# Patient Record
Sex: Male | Born: 1985 | Race: White | Hispanic: No | Marital: Single | Smoking: Current every day smoker
Health system: Southern US, Community
[De-identification: ages and names within clinical notes are randomized; demographics above are authoritative.]

## PROBLEM LIST (undated history)

## (undated) DIAGNOSIS — K7460 Unspecified cirrhosis of liver: Secondary | ICD-10-CM

## (undated) DIAGNOSIS — F1010 Alcohol abuse, uncomplicated: Secondary | ICD-10-CM

## (undated) DIAGNOSIS — M549 Dorsalgia, unspecified: Secondary | ICD-10-CM

## (undated) DIAGNOSIS — R17 Unspecified jaundice: Secondary | ICD-10-CM

## (undated) DIAGNOSIS — F1910 Other psychoactive substance abuse, uncomplicated: Secondary | ICD-10-CM

## (undated) DIAGNOSIS — B1920 Unspecified viral hepatitis C without hepatic coma: Secondary | ICD-10-CM

---

## 2017-12-23 ENCOUNTER — Other Ambulatory Visit: Payer: Self-pay

## 2017-12-23 ENCOUNTER — Encounter (HOSPITAL_COMMUNITY): Payer: Self-pay

## 2017-12-23 ENCOUNTER — Emergency Department (HOSPITAL_COMMUNITY): Payer: Medicaid - Out of State

## 2017-12-23 ENCOUNTER — Inpatient Hospital Stay (HOSPITAL_COMMUNITY)
Admission: EM | Admit: 2017-12-23 | Discharge: 2018-01-21 | DRG: 871 | Disposition: A | Discharge status: Home / Self Care | Payer: Medicaid - Out of State | Attending: Internal Medicine | Admitting: Internal Medicine

## 2017-12-23 DIAGNOSIS — E860 Dehydration: Secondary | ICD-10-CM | POA: Diagnosis present

## 2017-12-23 DIAGNOSIS — B958 Unspecified staphylococcus as the cause of diseases classified elsewhere: Secondary | ICD-10-CM | POA: Diagnosis present

## 2017-12-23 DIAGNOSIS — Z5329 Procedure and treatment not carried out because of patient's decision for other reasons: Secondary | ICD-10-CM | POA: Diagnosis not present

## 2017-12-23 DIAGNOSIS — D509 Iron deficiency anemia, unspecified: Secondary | ICD-10-CM | POA: Diagnosis present

## 2017-12-23 DIAGNOSIS — M60112 Interstitial myositis, left shoulder: Secondary | ICD-10-CM | POA: Diagnosis not present

## 2017-12-23 DIAGNOSIS — K6812 Psoas muscle abscess: Secondary | ICD-10-CM | POA: Diagnosis present

## 2017-12-23 DIAGNOSIS — M60812 Other myositis, left shoulder: Secondary | ICD-10-CM | POA: Diagnosis present

## 2017-12-23 DIAGNOSIS — K7460 Unspecified cirrhosis of liver: Secondary | ICD-10-CM | POA: Diagnosis present

## 2017-12-23 DIAGNOSIS — B182 Chronic viral hepatitis C: Secondary | ICD-10-CM | POA: Diagnosis present

## 2017-12-23 DIAGNOSIS — I369 Nonrheumatic tricuspid valve disorder, unspecified: Secondary | ICD-10-CM | POA: Diagnosis present

## 2017-12-23 DIAGNOSIS — F411 Generalized anxiety disorder: Secondary | ICD-10-CM | POA: Diagnosis present

## 2017-12-23 DIAGNOSIS — I330 Acute and subacute infective endocarditis: Secondary | ICD-10-CM | POA: Diagnosis present

## 2017-12-23 DIAGNOSIS — E43 Unspecified severe protein-calorie malnutrition: Secondary | ICD-10-CM | POA: Diagnosis present

## 2017-12-23 DIAGNOSIS — A419 Sepsis, unspecified organism: Secondary | ICD-10-CM | POA: Diagnosis present

## 2017-12-23 DIAGNOSIS — M7552 Bursitis of left shoulder: Secondary | ICD-10-CM | POA: Diagnosis present

## 2017-12-23 DIAGNOSIS — M869 Osteomyelitis, unspecified: Secondary | ICD-10-CM | POA: Insufficient documentation

## 2017-12-23 DIAGNOSIS — A4101 Sepsis due to Methicillin susceptible Staphylococcus aureus: Principal | ICD-10-CM | POA: Diagnosis present

## 2017-12-23 DIAGNOSIS — Z79899 Other long term (current) drug therapy: Secondary | ICD-10-CM

## 2017-12-23 DIAGNOSIS — F1990 Other psychoactive substance use, unspecified, uncomplicated: Secondary | ICD-10-CM | POA: Diagnosis not present

## 2017-12-23 DIAGNOSIS — R6520 Severe sepsis without septic shock: Secondary | ICD-10-CM

## 2017-12-23 DIAGNOSIS — M4626 Osteomyelitis of vertebra, lumbar region: Secondary | ICD-10-CM | POA: Diagnosis present

## 2017-12-23 DIAGNOSIS — I2690 Septic pulmonary embolism without acute cor pulmonale: Secondary | ICD-10-CM | POA: Diagnosis present

## 2017-12-23 DIAGNOSIS — M25512 Pain in left shoulder: Secondary | ICD-10-CM | POA: Diagnosis not present

## 2017-12-23 DIAGNOSIS — F17200 Nicotine dependence, unspecified, uncomplicated: Secondary | ICD-10-CM | POA: Diagnosis present

## 2017-12-23 DIAGNOSIS — Z880 Allergy status to penicillin: Secondary | ICD-10-CM

## 2017-12-23 DIAGNOSIS — E876 Hypokalemia: Secondary | ICD-10-CM | POA: Diagnosis present

## 2017-12-23 DIAGNOSIS — E872 Acidosis, unspecified: Secondary | ICD-10-CM | POA: Diagnosis present

## 2017-12-23 DIAGNOSIS — Z87892 Personal history of anaphylaxis: Secondary | ICD-10-CM

## 2017-12-23 DIAGNOSIS — Z720 Tobacco use: Secondary | ICD-10-CM

## 2017-12-23 DIAGNOSIS — D638 Anemia in other chronic diseases classified elsewhere: Secondary | ICD-10-CM | POA: Diagnosis present

## 2017-12-23 DIAGNOSIS — B1920 Unspecified viral hepatitis C without hepatic coma: Secondary | ICD-10-CM | POA: Diagnosis present

## 2017-12-23 DIAGNOSIS — Z6824 Body mass index (BMI) 24.0-24.9, adult: Secondary | ICD-10-CM | POA: Diagnosis not present

## 2017-12-23 DIAGNOSIS — K5903 Drug induced constipation: Secondary | ICD-10-CM | POA: Diagnosis not present

## 2017-12-23 DIAGNOSIS — F1910 Other psychoactive substance abuse, uncomplicated: Secondary | ICD-10-CM | POA: Diagnosis present

## 2017-12-23 DIAGNOSIS — T402X5A Adverse effect of other opioids, initial encounter: Secondary | ICD-10-CM | POA: Diagnosis not present

## 2017-12-23 DIAGNOSIS — M25519 Pain in unspecified shoulder: Secondary | ICD-10-CM

## 2017-12-23 DIAGNOSIS — R197 Diarrhea, unspecified: Secondary | ICD-10-CM | POA: Diagnosis not present

## 2017-12-23 DIAGNOSIS — N179 Acute kidney failure, unspecified: Secondary | ICD-10-CM | POA: Diagnosis present

## 2017-12-23 HISTORY — DX: Unspecified cirrhosis of liver: K74.60

## 2017-12-23 HISTORY — DX: Other psychoactive substance abuse, uncomplicated: F19.10

## 2017-12-23 HISTORY — DX: Unspecified viral hepatitis C without hepatic coma: B19.20

## 2017-12-23 HISTORY — DX: Unspecified jaundice: R17

## 2017-12-23 HISTORY — DX: Dorsalgia, unspecified: M54.9

## 2017-12-23 HISTORY — DX: Alcohol abuse, uncomplicated: F10.10

## 2017-12-23 LAB — COMPREHENSIVE METABOLIC PANEL
ALT: 42 U/L (ref 0–44)
AST: 101 U/L — AB (ref 15–41)
Albumin: 2 g/dL — ABNORMAL LOW (ref 3.5–5.0)
Alkaline Phosphatase: 143 U/L — ABNORMAL HIGH (ref 38–126)
Anion gap: 17 — ABNORMAL HIGH (ref 5–15)
BUN: 37 mg/dL — AB (ref 6–20)
CHLORIDE: 100 mmol/L (ref 98–111)
CO2: 16 mmol/L — AB (ref 22–32)
CREATININE: 2.52 mg/dL — AB (ref 0.61–1.24)
Calcium: 6.8 mg/dL — ABNORMAL LOW (ref 8.9–10.3)
GFR calc Af Amer: 37 mL/min — ABNORMAL LOW (ref >60)
GFR calc non Af Amer: 32 mL/min — ABNORMAL LOW (ref >60)
Glucose, Bld: 126 mg/dL — ABNORMAL HIGH (ref 70–99)
Potassium: 2.9 mmol/L — ABNORMAL LOW (ref 3.5–5.1)
SODIUM: 133 mmol/L — AB (ref 135–145)
Total Bilirubin: 1.5 mg/dL — ABNORMAL HIGH (ref 0.3–1.2)
Total Protein: 6.5 g/dL (ref 6.5–8.1)

## 2017-12-23 LAB — CBC WITH DIFFERENTIAL/PLATELET
Abs Immature Granulocytes: 0.38 10*3/uL — ABNORMAL HIGH (ref 0.00–0.07)
BASOS ABS: 0 10*3/uL (ref 0.0–0.1)
Basophils Relative: 0 %
Eosinophils Absolute: 0 10*3/uL (ref 0.0–0.5)
Eosinophils Relative: 0 %
HCT: 38.8 % — ABNORMAL LOW (ref 39.0–52.0)
Hemoglobin: 12.5 g/dL — ABNORMAL LOW (ref 13.0–17.0)
IMMATURE GRANULOCYTES: 2 %
LYMPHS PCT: 6 %
Lymphs Abs: 0.9 10*3/uL (ref 0.7–4.0)
MCH: 26.9 pg (ref 26.0–34.0)
MCHC: 32.2 g/dL (ref 30.0–36.0)
MCV: 83.4 fL (ref 80.0–100.0)
MONO ABS: 0.7 10*3/uL (ref 0.1–1.0)
Monocytes Relative: 5 %
NEUTROS ABS: 13.5 10*3/uL — AB (ref 1.7–7.7)
Neutrophils Relative %: 87 %
Platelets: 162 10*3/uL (ref 150–400)
RBC: 4.65 MIL/uL (ref 4.22–5.81)
RDW: 13.1 % (ref 11.5–15.5)
WBC: 15.6 10*3/uL — AB (ref 4.0–10.5)
nRBC: 0 % (ref 0.0–0.2)

## 2017-12-23 LAB — URINALYSIS, ROUTINE W REFLEX MICROSCOPIC
Bilirubin Urine: NEGATIVE
Glucose, UA: NEGATIVE mg/dL
Hgb urine dipstick: NEGATIVE
Ketones, ur: NEGATIVE mg/dL
Nitrite: NEGATIVE
Protein, ur: 30 mg/dL — AB
SPECIFIC GRAVITY, URINE: 1.011 (ref 1.005–1.030)
pH: 5 (ref 5.0–8.0)

## 2017-12-23 LAB — I-STAT CG4 LACTIC ACID, ED
Lactic Acid, Venous: 4.09 mmol/L (ref 0.5–1.9)
Lactic Acid, Venous: 1.46 mmol/L (ref 0.5–1.9)

## 2017-12-23 LAB — INFLUENZA PANEL BY PCR (TYPE A & B)
INFLAPCR: NEGATIVE
INFLBPCR: NEGATIVE

## 2017-12-23 LAB — SEDIMENTATION RATE: Sed Rate: 87 mm/hr — ABNORMAL HIGH (ref 0–16)

## 2017-12-23 LAB — C-REACTIVE PROTEIN: CRP: 29.6 mg/dL — ABNORMAL HIGH (ref <1.0)

## 2017-12-23 LAB — MAGNESIUM: Magnesium: 2.1 mg/dL (ref 1.7–2.4)

## 2017-12-23 MED ORDER — SODIUM CHLORIDE 0.9 % IV SOLN
INTRAVENOUS | Status: DC
  Administered 2017-12-23: 19:00:00 via INTRAVENOUS

## 2017-12-23 MED ORDER — LEVOFLOXACIN IN D5W 750 MG/150ML IV SOLN
750.0000 mg | Freq: Once | INTRAVENOUS | Status: AC
  Administered 2017-12-23: 750 mg via INTRAVENOUS
  Filled 2017-12-23: qty 150

## 2017-12-23 MED ORDER — OXYCODONE HCL 5 MG PO TABS
5.0000 mg | ORAL_TABLET | ORAL | Status: DC | PRN
  Administered 2017-12-23 – 2017-12-28 (×17): 5 mg via ORAL
  Filled 2017-12-23 (×16): qty 1

## 2017-12-23 MED ORDER — FENTANYL CITRATE (PF) 100 MCG/2ML IJ SOLN
50.0000 ug | INTRAMUSCULAR | Status: DC | PRN
  Administered 2017-12-23 (×2): 50 ug via INTRAVENOUS
  Filled 2017-12-23 (×2): qty 2

## 2017-12-23 MED ORDER — SODIUM CHLORIDE 0.9 % IV BOLUS
1000.0000 mL | Freq: Once | INTRAVENOUS | Status: AC
  Administered 2017-12-23: 1000 mL via INTRAVENOUS

## 2017-12-23 MED ORDER — ACETAMINOPHEN 325 MG PO TABS
650.0000 mg | ORAL_TABLET | Freq: Three times a day (TID) | ORAL | Status: DC | PRN
  Administered 2017-12-23 – 2017-12-29 (×7): 650 mg via ORAL
  Filled 2017-12-23 (×7): qty 2

## 2017-12-23 MED ORDER — MORPHINE SULFATE (PF) 2 MG/ML IV SOLN
2.0000 mg | INTRAVENOUS | Status: DC | PRN
  Administered 2017-12-24 – 2017-12-28 (×10): 2 mg via INTRAVENOUS
  Filled 2017-12-23 (×10): qty 1

## 2017-12-23 MED ORDER — GADOBUTROL 1 MMOL/ML IV SOLN
7.0000 mL | Freq: Once | INTRAVENOUS | Status: AC | PRN
  Administered 2017-12-23: 7 mL via INTRAVENOUS

## 2017-12-23 MED ORDER — POTASSIUM CHLORIDE CRYS ER 20 MEQ PO TBCR
40.0000 meq | EXTENDED_RELEASE_TABLET | ORAL | Status: AC
  Administered 2017-12-23 (×2): 40 meq via ORAL
  Filled 2017-12-23 (×2): qty 2

## 2017-12-23 MED ORDER — SODIUM CHLORIDE 0.9 % IV SOLN
1.0000 g | Freq: Three times a day (TID) | INTRAVENOUS | Status: DC
  Administered 2017-12-23 – 2017-12-24 (×2): 1 g via INTRAVENOUS
  Filled 2017-12-23 (×8): qty 1

## 2017-12-23 MED ORDER — ACETAMINOPHEN 500 MG PO TABS
1000.0000 mg | ORAL_TABLET | Freq: Once | ORAL | Status: AC
  Administered 2017-12-23: 1000 mg via ORAL
  Filled 2017-12-23: qty 2

## 2017-12-23 MED ORDER — VANCOMYCIN HCL IN DEXTROSE 750-5 MG/150ML-% IV SOLN
750.0000 mg | Freq: Two times a day (BID) | INTRAVENOUS | Status: DC
  Administered 2017-12-24: 750 mg via INTRAVENOUS
  Filled 2017-12-23 (×6): qty 150

## 2017-12-23 MED ORDER — SODIUM CHLORIDE 0.9 % IV BOLUS
500.0000 mL | Freq: Once | INTRAVENOUS | Status: AC
  Administered 2017-12-23: 500 mL via INTRAVENOUS

## 2017-12-23 MED ORDER — NICOTINE 21 MG/24HR TD PT24
21.0000 mg | MEDICATED_PATCH | Freq: Every day | TRANSDERMAL | Status: DC
  Filled 2017-12-23 (×10): qty 1

## 2017-12-23 MED ORDER — LEVOFLOXACIN IN D5W 750 MG/150ML IV SOLN: 750.0000 mg | INTRAVENOUS | Status: DC

## 2017-12-23 MED ORDER — ONDANSETRON HCL 4 MG/2ML IJ SOLN: 4.0000 mg | Freq: Four times a day (QID) | INTRAMUSCULAR | Status: DC | PRN

## 2017-12-23 MED ORDER — SODIUM CHLORIDE 0.9 % IV SOLN
2.0000 g | Freq: Once | INTRAVENOUS | Status: AC
  Administered 2017-12-23: 2 g via INTRAVENOUS
  Filled 2017-12-23: qty 2

## 2017-12-23 MED ORDER — VANCOMYCIN HCL 10 G IV SOLR
2000.0000 mg | Freq: Once | INTRAVENOUS | Status: AC
  Administered 2017-12-23: 2000 mg via INTRAVENOUS
  Filled 2017-12-23: qty 2000

## 2017-12-23 MED ORDER — SODIUM CHLORIDE 0.9% FLUSH
3.0000 mL | Freq: Two times a day (BID) | INTRAVENOUS | Status: DC
  Administered 2017-12-24 – 2018-01-21 (×24): 3 mL via INTRAVENOUS

## 2017-12-23 MED ORDER — SACCHAROMYCES BOULARDII 250 MG PO CAPS
250.0000 mg | ORAL_CAPSULE | Freq: Two times a day (BID) | ORAL | Status: DC
  Administered 2017-12-23 – 2018-01-20 (×57): 250 mg via ORAL
  Filled 2017-12-23 (×60): qty 1

## 2017-12-23 MED ORDER — ONDANSETRON HCL 4 MG PO TABS: 4.0000 mg | ORAL_TABLET | Freq: Four times a day (QID) | ORAL | Status: DC | PRN

## 2017-12-23 MED ORDER — HEPARIN SODIUM (PORCINE) 5000 UNIT/ML IJ SOLN
5000.0000 [IU] | Freq: Three times a day (TID) | INTRAMUSCULAR | Status: DC
  Administered 2017-12-23 – 2018-01-10 (×52): 5000 [IU] via SUBCUTANEOUS
  Filled 2017-12-23 (×52): qty 1

## 2017-12-23 MED ORDER — ACETAMINOPHEN 650 MG RE SUPP: 650.0000 mg | Freq: Three times a day (TID) | RECTAL | Status: DC | PRN

## 2017-12-23 MED ORDER — VANCOMYCIN HCL IN DEXTROSE 1-5 GM/200ML-% IV SOLN: 1000.0000 mg | Freq: Once | INTRAVENOUS | Status: DC

## 2017-12-23 NOTE — ED Triage Notes (Signed)
Pt reports he stood up from the couch a few days ago and had pain in lower back.  Pt went to [facility] and was told he has a hairline fracture in lower back.  Since then pt reports fever and bodyaches.

## 2017-12-23 NOTE — ED Notes (Signed)
Lab drawing second blood culture at this time.

## 2017-12-23 NOTE — ED Notes (Signed)
PT reminded the need for a urine sample from him. Urinal at bedside.

## 2017-12-23 NOTE — ED Notes (Signed)
CRITICAL VALUE ALERT  Critical Value:  I-stat lactic 4.09  Date & Time Notied: 12/23/17 0956  Provider Notified: Dr. Jodi Mourning

## 2017-12-23 NOTE — Progress Notes (Signed)
Pharmacy Antibiotic Note  Howard Diaz is a 32 y.o. male admitted on 12/23/2017 with sepsis and osteomyelitis of lumbar spine.  Pharmacy has been consulted for Vancomycin, Levaquin, and Aztreonam dosing.  Plan: Vancomycin 2000 mg IV x 1 dose. Vancomycin 750 mg IV every 12 hours.  Goal trough 15-20 mcg/mL.  Aztreonam 1000 mg IV every 8 hours. Levaquin 750 mg IV every 48 hours. Monitor labs, c/s, and vanco trough as indicated.  Height: 6' (182.9 cm) Weight: 180 lb (81.6 kg) IBW/kg (Calculated) : 77.6  Temp (24hrs), Avg:99.4 F (37.4 C), Min:98.5 F (36.9 C), Max:100.2 F (37.9 C)  Recent Labs  Lab 12/23/17 0950 12/23/17 0952 12/23/17 1200 12/23/17 1431  WBC 15.6*  --   --   --   CREATININE  --   --  2.52*  --   LATICACIDVEN  --  4.09*  --  1.46    Estimated Creatinine Clearance: 45.8 mL/min (A) (by C-G formula based on SCr of 2.52 mg/dL (H)).    Allergies  Allergen Reactions  . Penicillins Anaphylaxis    Has patient had a PCN reaction causing immediate rash, facial/tongue/throat swelling, SOB or lightheadedness with hypotension: Yes Has patient had a PCN reaction causing severe rash involving mucus membranes or skin necrosis: Yes Has patient had a PCN reaction that required hospitalization:Yes Has patient had a PCN reaction occurring within the last 10 years: No If all of the above answers are "NO", then may proceed with Cephalosporin use.     Antimicrobials this admission: Vanco 11/5 >>  Aztreonam 11/5 >>  Levaquin 11/5 >>  Microbiology results: 11/5 BCx: pending 11/5 UCx: pending   Thank you for allowing pharmacy to be a part of this patient's care.  Tad Moore 12/23/2017 3:21 PM

## 2017-12-23 NOTE — H&P (Signed)
History and Physical    Howard Diaz WYO:[id number] DOB: 1985/04/08 DOA: 12/23/2017  Referring MD/NP/PA: Dr. Reather Converse PCP: Patient, No Pcp Per  Patient coming from: Home  Chief Complaint: Fever, lower back pain, general malaise  HPI: Howard Diaz is a 32 y.o. male with a past medical history significant for alcohol use, history of hepatitis C, acute hepatic toxicity felt to be a combination of hepatitis C and alcohol abuse (he required hospitalization at that time and experienced jaundice), also with History of tobacco abuse and IV drug use; who presented to the emergency department secondary to lower back pain, weakness, fever and general malaise.  Patient reports back pain has been present for the last week or so and worsening.  Pain is localized in the lower aspect of his back exacerbated by any kind of movement, reports sudden intermittent radiation across his back and reported some numbness feeling. Patient has had anorexia and reported some loose stools.  Patient denies any chest pain, cough, dysuria, hematuria, melena, hematochezia, abdominal pain, nausea, vomiting or any other complaints.  He was seen in the emergency department (outside facility), where he was diagnosed with fine hair fracture after having a CT scan of his lower protuberance.  Patient was discharged home on Flexeril and pain medication.  He reported no improvement in symptoms and given the initiation of fever decide to seek further medical care.  In the ED work-up demonstrated patient meeting sepsis criteria with elevated temperature, tachycardia, elevated WBCs and images studies suggesting acute lumbar osteomyelitis.  Patient received fluid resuscitation as per sepsis protocol, cultures taken and patient started on IV antibiotics.  [facility] has been contacted to new patient for further management.  Past Medical/Surgical History: Past Medical History:  Diagnosis Date  . Alcohol abuse   . Back pain   . Cirrhosis of  liver ([place])   . Hepatitis C   . IV drug abuse ([place])   . Jaundice     History reviewed. No pertinent surgical history.  Social History:  reports that he has been smoking. He has been smoking about 1.00 pack per day. He has never used smokeless tobacco. He reports that he drank alcohol. He reported having hx of IV drug usage, but has been clean for couple weeks now.  Allergies: Allergies  Allergen Reactions  . Penicillins Anaphylaxis    Has patient had a PCN reaction causing immediate rash, facial/tongue/throat swelling, SOB or lightheadedness with hypotension: Yes Has patient had a PCN reaction causing severe rash involving mucus membranes or skin necrosis: Yes Has patient had a PCN reaction that required hospitalization:Yes Has patient had a PCN reaction occurring within the last 10 years: No If all of the above answers are "NO", then may proceed with Cephalosporin use.     Family History:  History reviewed. No pertinent family history.  Prior to Admission medications   Medication Sig Start Date End Date Taking? Authorizing Provider  buprenorphine-naloxone (SUBOXONE) 8-2 mg SUBL SL tablet Place 1 tablet under the tongue 3 (three) times daily.   Yes [provider]  lidocaine (LIDODERM) 5 % Place 1 patch onto the skin daily. Remove & Discard patch within 12 hours or as directed by MD   Yes [provider]  naproxen (NAPROSYN) 500 MG tablet Take 500 mg by mouth 2 (two) times daily. 12/11/17  Yes [provider]  cyclobenzaprine (FLEXERIL) 10 MG tablet Take 10 mg by mouth every 8 (eight) hours. 12/11/17   [provider]    Review of  Systems:  Negative except as otherwise mentioned in HPI.   Physical Exam: Vitals:   12/23/17 1500 12/23/17 1520 12/23/17 1530 12/23/17 1627  BP:  117/72 118/73 119/68  Pulse: (!) 106 (!) 107 (!) 109 (!) 124  Resp: (!) 22 (!) 22 (!) 26 20  Temp:    98.3 F (36.8 C)  TempSrc:    Oral  SpO2: 93% 93% 93% 99%    Weight:    77.4 kg  Height:    5' 11" (1.803 m)   Constitutional: Warm to touch, denying chest pain no having shortness of breath.  Report experiencing dry mouth and just complaining of lower back pain with any movement.  No jaundiced on exam. Eyes: PERRL, lids and conjunctivae normal, no icterus, no nystagmus. ENMT: Mucous membranes are moist. Posterior pharynx clear of any exudate or lesions. fair dentition. Neck: normal, supple, no masses, no thyromegaly, no JVD Respiratory: clear to auscultation bilaterally, no wheezing, no crackles. Normal respiratory effort. No accessory muscle use.  Cardiovascular: Tachycardic, sinus rhythm appreciated on ED telemetry monitoring.  No extremity edema. 2+ pedal pulses. No carotid bruits.  Abdomen: no tenderness, no masses palpated. No hepatosplenomegaly. Bowel sounds positive.  Musculoskeletal: no clubbing / cyanosis. No joint deformity upper and lower extremities. Good ROM, no contractures. Normal muscle tone.  Patient complaining of lower back pain with movement and deep palpation. Skin: no rashes, lesions, ulcers. No induration Neurologic: CN 2-12 grossly intact. DTR normal. Strength 5/5 in all 4.  Psychiatric: Normal judgment and insight. Alert and oriented x 3. Normal mood.    Labs on Admission: I have personally reviewed the following labs and imaging studies  CBC: Recent Labs  Lab 12/23/17 0950  WBC 15.6*  NEUTROABS 13.5*  HGB 12.5*  HCT 38.8*  MCV 83.4  PLT 062   Basic Metabolic Panel: Recent Labs  Lab 12/23/17 1200 12/23/17 1523  NA 133*  --   K 2.9*  --   CL 100  --   CO2 16*  --   GLUCOSE 126*  --   BUN 37*  --   CREATININE 2.52*  --   CALCIUM 6.8*  --   MG  --  2.1   GFR: Estimated Creatinine Clearance: 44.4 mL/min (A) (by C-G formula based on SCr of 2.52 mg/dL (H)).   Liver Function Tests: Recent Labs  Lab 12/23/17 1200  AST 101*  ALT 42  ALKPHOS 143*  BILITOT 1.5*  PROT 6.5  ALBUMIN 2.0*   Urine  analysis:    Component Value Date/Time   COLORURINE AMBER (A) 12/23/2017 1300   APPEARANCEUR CLOUDY (A) 12/23/2017 1300   LABSPEC 1.011 12/23/2017 1300   PHURINE 5.0 12/23/2017 1300   GLUCOSEU NEGATIVE 12/23/2017 1300   HGBUR NEGATIVE 12/23/2017 1300   BILIRUBINUR NEGATIVE 12/23/2017 1300   KETONESUR NEGATIVE 12/23/2017 1300   PROTEINUR 30 (A) 12/23/2017 1300   NITRITE NEGATIVE 12/23/2017 1300   LEUKOCYTESUR TRACE (A) 12/23/2017 1300    Recent Results (from the past 240 hour(s))  Blood Culture (routine x 2)     Status: None (Preliminary result)   Collection Time: 12/23/17  9:50 AM  Result Value Ref Range Status   Specimen Description BLOOD LEFT HAND  Final   Special Requests   Final    BOTTLES DRAWN AEROBIC AND ANAEROBIC Blood Culture adequate volume Performed at [facility], [address] [place], [place] [address]    Culture PENDING  Incomplete   Report Status PENDING  Incomplete  Radiological Exams on Admission: Dg Chest 2 View  Result Date: 12/23/2017 CLINICAL DATA:  Fever and body aches. EXAM: CHEST - 2 VIEW COMPARISON:  None. FINDINGS: The heart is normal in size given the AP projection. The mediastinal and hilar contours appear normal. Streaky areas of subsegmental atelectasis are noted. No definite infiltrates or effusions. The bony thorax is intact. IMPRESSION: Low lung volumes with streaky areas of subsegmental atelectasis but no definite infiltrates or effusions. Electronically Signed   By: P.  Gallerani M.D.   On: 12/23/2017 10:35   Mr Thoracic Spine W Wo Contrast  Result Date: 12/23/2017 CLINICAL DATA:  Initial evaluation for progressive back pain, fever, history of IV drug abuse. EXAM: MRI THORACIC AND LUMBAR SPINE WITHOUT AND WITH CONTRAST TECHNIQUE: Multiplanar and multiecho pulse sequences of the thoracic and lumbar spine were obtained without and with intravenous contrast. CONTRAST:  7 cc Gadavist. COMPARISON:  None available. FINDINGS: MRI THORACIC SPINE  FINDINGS Alignment: Examination technically limited by you technique in large field-of-view on sagittal sequences. Mild scoliosis. Alignment otherwise normal with preservation of the normal thoracic kyphosis. No listhesis or malalignment. Vertebrae: Vertebral body heights maintained without evidence for acute or chronic fracture. Bone marrow signal intensity diffusely decreased on T1 weighted imaging, most commonly related to anemia, smoking, or obesity. Few small benign hemangiomas noted within the lower thoracic spine. No worrisome osseous lesions. No evidence for osteomyelitis discitis or septic arthritis within the thoracic spine. Cord: Signal intensity within the thoracic spinal cord is normal. No epidural collections. Paraspinal and other soft tissues: Paraspinous soft tissues demonstrate no acute finding. Atelectasis versus consolidation noted within the posterior left lung base. Visualized lungs are otherwise grossly clear. Visualized visceral structures grossly unremarkable. Disc levels: T1-2:  Unremarkable. T2-3: Unremarkable. T3-4:  Unremarkable. T4-5: Shallow left paracentral disc protrusion mildly indents the left ventral thecal sac. Minimal flattening of the left ventral cord without significant stenosis. T5-6:  Unremarkable. T6-7: Right paracentral disc protrusion with slight superior migration. Protruding disc contacts the right ventral cord with secondary mild cord flattening. No significant stenosis. T7-8: Shallow right paracentral disc protrusion abuts the right ventral cord. No significant stenosis or cord deformity. T8-9: Shallow left paracentral disc protrusion mildly indents the left ventral thecal sac. Minimal flattening of the left ventral cord without significant stenosis. T9-10: Unremarkable. T10-11: Minimal disc bulge. Mild posterior element hypertrophy. No significant stenosis. T11-12: Mild posterior element hypertrophy. No significant stenosis. T12-L1:  Unremarkable. MRI LUMBAR SPINE  FINDINGS Segmentation: Normal segmentation. Lowest well-formed disc labeled the L5-S1 level. Alignment: Straightening of the normal lumbar lordosis with trace dextroscoliosis. No listhesis or malalignment. Vertebrae: Abnormal edema and enhancement seen within the L3 and L4 vertebral bodies centered about the L3-4 disc space. Abnormal edema with loss of normal disc height seen within the L3-4 disc itself. Findings consistent with acute osteomyelitis discitis. Mild diffuse enhancement seen within the ventral epidural space, extending into the bilateral L3-4 neural foramina without frank epidural abscess or collection. Associated paraspinous edema and enhancement within the adjacent psoas musculature bilaterally. Superimposed paraspinous abscess within the right psoas muscle measures 1.3 x 1.0 x 2.7 cm (AP by transverse by craniocaudad, series 7, image 25). No other discrete soft tissue collections. Mild edema and enhancement noted about the bilateral L3-4 facets as well. No other evidence for acute infection within the lumbar spine. Vertebral body height maintained at this time without evidence for acute or chronic fracture. Underlying bone marrow signal intensity diffusely decreased on T1 weighted imaging. Small benign hemangioma noted   within the T12 vertebral body. No worrisome osseous lesions. Conus medullaris: Extends to the L1 level and appears normal. Paraspinal and other soft tissues: Paraspinous edema and enhancement adjacent to the L3-4 interspace with superimposed right psoas muscle abscess as above. Soft tissue edema within the posterior paraspinous musculature extending from L3-4 inferiorly could be reactive in nature or reflect associated myositis (series 2, images 6, 12). No discrete collections within this region. Retroperitoneal lymph nodes measure up to 16 mm in short axis (series 4, image 15), indeterminate, but could be reactive. Visualized visceral structures grossly unremarkable. Disc levels:  L1-2:  Unremarkable. L2-3:  Unremarkable. L3-4: Findings consistent with acute osteomyelitis discitis. Associated loss of L3-4 disc height with diffuse disc bulging. Enhancement within the ventral epidural space extending into the bilateral L3-4 neural foramina without frank epidural abscess. Resultant mild spinal stenosis. Mild to moderate bilateral L3 neural foraminal narrowing. L4-5:  Unremarkable. L5-S1:  Unremarkable. IMPRESSION: 1. Findings consistent with acute osteomyelitis discitis involving the L3-4 level. Associated enhancement involving the adjacent ventral epidural space without discrete epidural abscess or collection. Mild spinal stenosis at this level. 2. Paraspinous phlegmon and edema adjacent to the L3-4 level with superimposed 1.3 x 1.0 x 2.7 cm right psoas muscle abscess as above. 3. No other evidence for acute infection within the thoracolumbar spine. 4. Multifocal small disc protrusions involving the mid-thoracic spine as above without significant stenosis. Electronically Signed   By: Benjamin  McClintock M.D.   On: 12/23/2017 13:47   Mr Lumbar Spine W Wo Contrast  Result Date: 12/23/2017 CLINICAL DATA:  Initial evaluation for progressive back pain, fever, history of IV drug abuse. EXAM: MRI THORACIC AND LUMBAR SPINE WITHOUT AND WITH CONTRAST TECHNIQUE: Multiplanar and multiecho pulse sequences of the thoracic and lumbar spine were obtained without and with intravenous contrast. CONTRAST:  7 cc Gadavist. COMPARISON:  None available. FINDINGS: MRI THORACIC SPINE FINDINGS Alignment: Examination technically limited by you technique in large field-of-view on sagittal sequences. Mild scoliosis. Alignment otherwise normal with preservation of the normal thoracic kyphosis. No listhesis or malalignment. Vertebrae: Vertebral body heights maintained without evidence for acute or chronic fracture. Bone marrow signal intensity diffusely decreased on T1 weighted imaging, most commonly related to anemia,  smoking, or obesity. Few small benign hemangiomas noted within the lower thoracic spine. No worrisome osseous lesions. No evidence for osteomyelitis discitis or septic arthritis within the thoracic spine. Cord: Signal intensity within the thoracic spinal cord is normal. No epidural collections. Paraspinal and other soft tissues: Paraspinous soft tissues demonstrate no acute finding. Atelectasis versus consolidation noted within the posterior left lung base. Visualized lungs are otherwise grossly clear. Visualized visceral structures grossly unremarkable. Disc levels: T1-2:  Unremarkable. T2-3: Unremarkable. T3-4:  Unremarkable. T4-5: Shallow left paracentral disc protrusion mildly indents the left ventral thecal sac. Minimal flattening of the left ventral cord without significant stenosis. T5-6:  Unremarkable. T6-7: Right paracentral disc protrusion with slight superior migration. Protruding disc contacts the right ventral cord with secondary mild cord flattening. No significant stenosis. T7-8: Shallow right paracentral disc protrusion abuts the right ventral cord. No significant stenosis or cord deformity. T8-9: Shallow left paracentral disc protrusion mildly indents the left ventral thecal sac. Minimal flattening of the left ventral cord without significant stenosis. T9-10: Unremarkable. T10-11: Minimal disc bulge. Mild posterior element hypertrophy. No significant stenosis. T11-12: Mild posterior element hypertrophy. No significant stenosis. T12-L1:  Unremarkable. MRI LUMBAR SPINE FINDINGS Segmentation: Normal segmentation. Lowest well-formed disc labeled the L5-S1 level. Alignment: Straightening of the normal lumbar   lordosis with trace dextroscoliosis. No listhesis or malalignment. Vertebrae: Abnormal edema and enhancement seen within the L3 and L4 vertebral bodies centered about the L3-4 disc space. Abnormal edema with loss of normal disc height seen within the L3-4 disc itself. Findings consistent with acute  osteomyelitis discitis. Mild diffuse enhancement seen within the ventral epidural space, extending into the bilateral L3-4 neural foramina without frank epidural abscess or collection. Associated paraspinous edema and enhancement within the adjacent psoas musculature bilaterally. Superimposed paraspinous abscess within the right psoas muscle measures 1.3 x 1.0 x 2.7 cm (AP by transverse by craniocaudad, series 7, image 25). No other discrete soft tissue collections. Mild edema and enhancement noted about the bilateral L3-4 facets as well. No other evidence for acute infection within the lumbar spine. Vertebral body height maintained at this time without evidence for acute or chronic fracture. Underlying bone marrow signal intensity diffusely decreased on T1 weighted imaging. Small benign hemangioma noted within the T12 vertebral body. No worrisome osseous lesions. Conus medullaris: Extends to the L1 level and appears normal. Paraspinal and other soft tissues: Paraspinous edema and enhancement adjacent to the L3-4 interspace with superimposed right psoas muscle abscess as above. Soft tissue edema within the posterior paraspinous musculature extending from L3-4 inferiorly could be reactive in nature or reflect associated myositis (series 2, images 6, 12). No discrete collections within this region. Retroperitoneal lymph nodes measure up to 16 mm in short axis (series 4, image 15), indeterminate, but could be reactive. Visualized visceral structures grossly unremarkable. Disc levels: L1-2:  Unremarkable. L2-3:  Unremarkable. L3-4: Findings consistent with acute osteomyelitis discitis. Associated loss of L3-4 disc height with diffuse disc bulging. Enhancement within the ventral epidural space extending into the bilateral L3-4 neural foramina without frank epidural abscess. Resultant mild spinal stenosis. Mild to moderate bilateral L3 neural foraminal narrowing. L4-5:  Unremarkable. L5-S1:  Unremarkable. IMPRESSION: 1.  Findings consistent with acute osteomyelitis discitis involving the L3-4 level. Associated enhancement involving the adjacent ventral epidural space without discrete epidural abscess or collection. Mild spinal stenosis at this level. 2. Paraspinous phlegmon and edema adjacent to the L3-4 level with superimposed 1.3 x 1.0 x 2.7 cm right psoas muscle abscess as above. 3. No other evidence for acute infection within the thoracolumbar spine. 4. Multifocal small disc protrusions involving the mid-thoracic spine as above without significant stenosis. Electronically Signed   By: Benjamin  McClintock M.D.   On: 12/23/2017 13:47    EKG: Independently reviewed.  Sinus tachycardia, normal axis, no acute ischemic changes appreciated.  Assessment/Plan 1-sepsis due to acute osteomyelitis of lumbar spine (HCC) -Patient with prior history of IV drug use -Will check UDS -MRI confirming osteomyelitis with a small phlegmon/paraspinous abscess. -Case discussed with infectious disease who recommended the use of vancomycin and aztreonam (given patient prior history of anaphylaxis when exposed to penicillin). Patient will need 4 weeks of antibiotics. -ESR and CRP has been ordered -Will follow blood cultures -Fluid resuscitation given as per sepsis protocol; patient continue on 100 cc/h normal saline. -continue PRN analgesics and antipyretics. -Further evaluation management to be decided based on clinical response.  2-IV drug user -UDS ordered -Patient reported that he is looking to keep himself clean  3-Hepatitis-C -will check RNA quantification determine need for treatment. -no jaundice -LFT's showing just mild elevation on AST.  4-Lactic acid acidosis -due to sepsis -improved and back to normal after initial aggressive IVF's resuscitation  5-Hypokalemia -patient expressed having some loose stools prior to admission  -most likely GI loses contributing   to low K -will replete and check Mg level  6-acute  kidney injury -Unknown baseline -Creatinine 2.5 on admission -Will assume prerenal azotemia given dehydration, Sepsis and use of NSAIDs -Will provide fluid resuscitation -Check for urine culture -Follow renal function trend. -Minimize nephrotoxic agents -If needed will check renal ultrasound and get renal service consultation  7-tobacco abuse -I have discussed tobacco cessation with the patient.  I have counseled the patient regarding the negative impacts of continued tobacco use including but not limited to lung cancer, COPD, and cardiovascular disease.  I have discussed alternatives to tobacco and modalities that may help facilitate tobacco cessation including but not limited to biofeedback, hypnosis, and medications.  Total time spent with tobacco counseling was 5 minutes -nicotine patch ordered.    DVT prophylaxis: Heparin Code Status: Full code Family Communication: No family at bedside Disposition Plan: Anticipate discharge back home once antibiotic therapy completed and infection controlled. Consults called: Infectious disease ([facility]) advised to start antibiotic therapy with anticipated duration of 4 weeks.  Follow blood cultures and clinical response. Admission status: Inpatient, MedSurg, LOS more than 2 midnights anticipated given prior history of IV drug abuse and need of prolonged IV antibiotics. Patient is meeting sepsis criteria from acute lumbar osteomyelitis and is presenting with electrolytes impairment.    Time Spent: 65 minutes  Howard Madera MD [facility] Pager [phone]  If 7PM-7AM, please contact night-coverage [email or web address] Password [email or web address]  12/23/2017, 4:41 PM    

## 2017-12-23 NOTE — ED Notes (Signed)
PT is a difficult pt to draw blood on due to IV drug use history. IV antibiotics administered as soon as lab drew the second blood culture.

## 2017-12-23 NOTE — ED Provider Notes (Signed)
[facility] EMERGENCY DEPARTMENT Provider Note   CSN: [id number] Arrival date & time: 12/23/17  8119     History   Chief Complaint Chief Complaint  Patient presents with  . Fever    HPI Howard Diaz is a 32 y.o. male.  Patient with history of IV drug abuse, methadone use presents with worsening back pain, body aches, fevers for the past 5 days.  Patient was seen at outside hospital and had a work-up done to include per his report CT imaging was sent home with supportive care.  Patient returns for persistent worsening symptoms.  Patient for the past week has had nonspecific numbness in both lower extremities.  Lower back pain with palpation and any movement.  Patient's urine has been darker than usual.  Patient has hepatitis C history.     Past Medical History:  Diagnosis Date  . Alcohol abuse   . Back pain   . Cirrhosis of liver (HCC)   . Hepatitis C   . IV drug abuse (HCC)   . Jaundice     There are no active problems to display for this patient.   History reviewed. No pertinent surgical history.      Home Medications    Prior to Admission medications   Medication Sig Start Date End Date Taking? Authorizing Provider  buprenorphine-naloxone (SUBOXONE) 8-2 mg SUBL SL tablet Place 1 tablet under the tongue 3 (three) times daily.   Yes [provider]  lidocaine (LIDODERM) 5 % Place 1 patch onto the skin daily. Remove & Discard patch within 12 hours or as directed by MD   Yes [provider]  naproxen (NAPROSYN) 500 MG tablet Take 500 mg by mouth 2 (two) times daily. 12/11/17  Yes [provider]  cyclobenzaprine (FLEXERIL) 10 MG tablet Take 10 mg by mouth every 8 (eight) hours. 12/11/17   [provider]    Family History No family history on file.  Social History Social History   Tobacco Use  . Smoking status: Current Every Day Smoker  . Smokeless tobacco: Never Used  Substance Use Topics  . Alcohol use: Not  Currently    Frequency: Never    Comment: former heavy drinker  . Drug use: Not on file    Comment: history of opiod abuse- months ago     Allergies   Penicillins   Review of Systems Review of Systems  Constitutional: Positive for chills and fever.  HENT: Negative for congestion.   Eyes: Negative for visual disturbance.  Respiratory: Positive for cough. Negative for shortness of breath.   Cardiovascular: Negative for chest pain.  Gastrointestinal: Positive for nausea. Negative for abdominal pain and vomiting.  Genitourinary: Negative for dysuria and flank pain.  Musculoskeletal: Positive for back pain. Negative for neck pain and neck stiffness.  Skin: Negative for rash.  Neurological: Negative for light-headedness and headaches.     Physical Exam Updated Vital Signs BP 111/63   Pulse 97   Temp 100.2 F (37.9 C) (Oral)   Resp (!) 22   Ht 6' (1.829 m)   Wt 81.6 kg   SpO2 99%   BMI 24.41 kg/m   Physical Exam  Constitutional: He is oriented to person, place, and time. He appears well-developed and well-nourished.  HENT:  Head: Normocephalic and atraumatic.  Eyes: Conjunctivae are normal. Right eye exhibits no discharge. Left eye exhibits no discharge.  Neck: Normal range of motion. Neck supple. No tracheal deviation present.  Cardiovascular: Regular rhythm. Tachycardia present.  Pulmonary/Chest: Effort normal and breath sounds normal.  Abdominal: Soft. He exhibits no distension. There is no tenderness. There is no guarding.  Musculoskeletal: He exhibits tenderness. He exhibits no edema.  Neurological: He is alert and oriented to person, place, and time. No cranial nerve deficit. GCS eye subscore is 4. GCS verbal subscore is 5. GCS motor subscore is 6.  Patient has subjective decreased sensation lower extremities to palpation.  Patient has 2+ reflexes lower extremities.  Patient can flex and extend hips and knees with pain on the left lower back with straightening and  flexing the left leg.  Skin: Skin is warm. No rash noted.  Psychiatric: He has a normal mood and affect.  Nursing note and vitals reviewed.    ED Treatments / Results  Labs (all labs ordered are listed, but only abnormal results are displayed) Labs Reviewed  CBC WITH DIFFERENTIAL/PLATELET - Abnormal; Notable for the following components:      Result Value   WBC 15.6 (*)    Hemoglobin 12.5 (*)    HCT 38.8 (*)    Neutro Abs 13.5 (*)    Abs Immature Granulocytes 0.38 (*)    All other components within normal limits  URINALYSIS, ROUTINE W REFLEX MICROSCOPIC - Abnormal; Notable for the following components:   Color, Urine AMBER (*)    APPearance CLOUDY (*)    Protein, ur 30 (*)    Leukocytes, UA TRACE (*)    Bacteria, UA MANY (*)    All other components within normal limits  I-STAT CG4 LACTIC ACID, ED - Abnormal; Notable for the following components:   Lactic Acid, Venous 4.09 (*)    All other components within normal limits  CULTURE, BLOOD (ROUTINE X 2)  CULTURE, BLOOD (ROUTINE X 2)  URINE CULTURE  INFLUENZA PANEL BY PCR (TYPE A & B)  LACTIC ACID, PLASMA  COMPREHENSIVE METABOLIC PANEL  I-STAT CG4 LACTIC ACID, ED    EKG EKG Interpretation  Date/Time:  Tuesday December 23 2017 09:34:35 EST Ventricular Rate:  145 PR Interval:    QRS Duration: 80 QT Interval:  337 QTC Calculation: 524 R Axis:   24 Text Interpretation:  Sinus tachycardia Atrial premature complex Probable left atrial enlargement Low voltage, extremity leads Prolonged QT interval Confirmed by Blane Ohara [phone]) on 12/23/2017 9:42:16 AM   Radiology Dg Chest 2 View  Result Date: 12/23/2017 CLINICAL DATA:  Fever and body aches. EXAM: CHEST - 2 VIEW COMPARISON:  None. FINDINGS: The heart is normal in size given the AP projection. The mediastinal and hilar contours appear normal. Streaky areas of subsegmental atelectasis are noted. No definite infiltrates or effusions. The bony thorax is intact. IMPRESSION:  Low lung volumes with streaky areas of subsegmental atelectasis but no definite infiltrates or effusions. Electronically Signed   By: Rudie Meyer M.D.   On: 12/23/2017 10:35   Mr Thoracic Spine W Wo Contrast  Result Date: 12/23/2017 CLINICAL DATA:  Initial evaluation for progressive back pain, fever, history of IV drug abuse. EXAM: MRI THORACIC AND LUMBAR SPINE WITHOUT AND WITH CONTRAST TECHNIQUE: Multiplanar and multiecho pulse sequences of the thoracic and lumbar spine were obtained without and with intravenous contrast. CONTRAST:  7 cc Gadavist. COMPARISON:  None available. FINDINGS: MRI THORACIC SPINE FINDINGS Alignment: Examination technically limited by you technique in large field-of-view on sagittal sequences. Mild scoliosis. Alignment otherwise normal with preservation of the normal thoracic kyphosis. No listhesis or malalignment. Vertebrae: Vertebral body heights maintained without evidence for acute or chronic fracture. Bone  marrow signal intensity diffusely decreased on T1 weighted imaging, most commonly related to anemia, smoking, or obesity. Few small benign hemangiomas noted within the lower thoracic spine. No worrisome osseous lesions. No evidence for osteomyelitis discitis or septic arthritis within the thoracic spine. Cord: Signal intensity within the thoracic spinal cord is normal. No epidural collections. Paraspinal and other soft tissues: Paraspinous soft tissues demonstrate no acute finding. Atelectasis versus consolidation noted within the posterior left lung base. Visualized lungs are otherwise grossly clear. Visualized visceral structures grossly unremarkable. Disc levels: T1-2:  Unremarkable. T2-3: Unremarkable. T3-4:  Unremarkable. T4-5: Shallow left paracentral disc protrusion mildly indents the left ventral thecal sac. Minimal flattening of the left ventral cord without significant stenosis. T5-6:  Unremarkable. T6-7: Right paracentral disc protrusion with slight superior  migration. Protruding disc contacts the right ventral cord with secondary mild cord flattening. No significant stenosis. T7-8: Shallow right paracentral disc protrusion abuts the right ventral cord. No significant stenosis or cord deformity. T8-9: Shallow left paracentral disc protrusion mildly indents the left ventral thecal sac. Minimal flattening of the left ventral cord without significant stenosis. T9-10: Unremarkable. T10-11: Minimal disc bulge. Mild posterior element hypertrophy. No significant stenosis. T11-12: Mild posterior element hypertrophy. No significant stenosis. T12-L1:  Unremarkable. MRI LUMBAR SPINE FINDINGS Segmentation: Normal segmentation. Lowest well-formed disc labeled the L5-S1 level. Alignment: Straightening of the normal lumbar lordosis with trace dextroscoliosis. No listhesis or malalignment. Vertebrae: Abnormal edema and enhancement seen within the L3 and L4 vertebral bodies centered about the L3-4 disc space. Abnormal edema with loss of normal disc height seen within the L3-4 disc itself. Findings consistent with acute osteomyelitis discitis. Mild diffuse enhancement seen within the ventral epidural space, extending into the bilateral L3-4 neural foramina without frank epidural abscess or collection. Associated paraspinous edema and enhancement within the adjacent psoas musculature bilaterally. Superimposed paraspinous abscess within the right psoas muscle measures 1.3 x 1.0 x 2.7 cm (AP by transverse by craniocaudad, series 7, image 25). No other discrete soft tissue collections. Mild edema and enhancement noted about the bilateral L3-4 facets as well. No other evidence for acute infection within the lumbar spine. Vertebral body height maintained at this time without evidence for acute or chronic fracture. Underlying bone marrow signal intensity diffusely decreased on T1 weighted imaging. Small benign hemangioma noted within the T12 vertebral body. No worrisome osseous lesions. Conus  medullaris: Extends to the L1 level and appears normal. Paraspinal and other soft tissues: Paraspinous edema and enhancement adjacent to the L3-4 interspace with superimposed right psoas muscle abscess as above. Soft tissue edema within the posterior paraspinous musculature extending from L3-4 inferiorly could be reactive in nature or reflect associated myositis (series 2, images 6, 12). No discrete collections within this region. Retroperitoneal lymph nodes measure up to 16 mm in short axis (series 4, image 15), indeterminate, but could be reactive. Visualized visceral structures grossly unremarkable. Disc levels: L1-2:  Unremarkable. L2-3:  Unremarkable. L3-4: Findings consistent with acute osteomyelitis discitis. Associated loss of L3-4 disc height with diffuse disc bulging. Enhancement within the ventral epidural space extending into the bilateral L3-4 neural foramina without frank epidural abscess. Resultant mild spinal stenosis. Mild to moderate bilateral L3 neural foraminal narrowing. L4-5:  Unremarkable. L5-S1:  Unremarkable. IMPRESSION: 1. Findings consistent with acute osteomyelitis discitis involving the L3-4 level. Associated enhancement involving the adjacent ventral epidural space without discrete epidural abscess or collection. Mild spinal stenosis at this level. 2. Paraspinous phlegmon and edema adjacent to the L3-4 level with superimposed 1.3 x  1.0 x 2.7 cm right psoas muscle abscess as above. 3. No other evidence for acute infection within the thoracolumbar spine. 4. Multifocal small disc protrusions involving the mid-thoracic spine as above without significant stenosis. Electronically Signed   By: Rise Mu M.D.   On: 12/23/2017 13:47   Mr Lumbar Spine W Wo Contrast  Result Date: 12/23/2017 CLINICAL DATA:  Initial evaluation for progressive back pain, fever, history of IV drug abuse. EXAM: MRI THORACIC AND LUMBAR SPINE WITHOUT AND WITH CONTRAST TECHNIQUE: Multiplanar and multiecho  pulse sequences of the thoracic and lumbar spine were obtained without and with intravenous contrast. CONTRAST:  7 cc Gadavist. COMPARISON:  None available. FINDINGS: MRI THORACIC SPINE FINDINGS Alignment: Examination technically limited by you technique in large field-of-view on sagittal sequences. Mild scoliosis. Alignment otherwise normal with preservation of the normal thoracic kyphosis. No listhesis or malalignment. Vertebrae: Vertebral body heights maintained without evidence for acute or chronic fracture. Bone marrow signal intensity diffusely decreased on T1 weighted imaging, most commonly related to anemia, smoking, or obesity. Few small benign hemangiomas noted within the lower thoracic spine. No worrisome osseous lesions. No evidence for osteomyelitis discitis or septic arthritis within the thoracic spine. Cord: Signal intensity within the thoracic spinal cord is normal. No epidural collections. Paraspinal and other soft tissues: Paraspinous soft tissues demonstrate no acute finding. Atelectasis versus consolidation noted within the posterior left lung base. Visualized lungs are otherwise grossly clear. Visualized visceral structures grossly unremarkable. Disc levels: T1-2:  Unremarkable. T2-3: Unremarkable. T3-4:  Unremarkable. T4-5: Shallow left paracentral disc protrusion mildly indents the left ventral thecal sac. Minimal flattening of the left ventral cord without significant stenosis. T5-6:  Unremarkable. T6-7: Right paracentral disc protrusion with slight superior migration. Protruding disc contacts the right ventral cord with secondary mild cord flattening. No significant stenosis. T7-8: Shallow right paracentral disc protrusion abuts the right ventral cord. No significant stenosis or cord deformity. T8-9: Shallow left paracentral disc protrusion mildly indents the left ventral thecal sac. Minimal flattening of the left ventral cord without significant stenosis. T9-10: Unremarkable. T10-11:  Minimal disc bulge. Mild posterior element hypertrophy. No significant stenosis. T11-12: Mild posterior element hypertrophy. No significant stenosis. T12-L1:  Unremarkable. MRI LUMBAR SPINE FINDINGS Segmentation: Normal segmentation. Lowest well-formed disc labeled the L5-S1 level. Alignment: Straightening of the normal lumbar lordosis with trace dextroscoliosis. No listhesis or malalignment. Vertebrae: Abnormal edema and enhancement seen within the L3 and L4 vertebral bodies centered about the L3-4 disc space. Abnormal edema with loss of normal disc height seen within the L3-4 disc itself. Findings consistent with acute osteomyelitis discitis. Mild diffuse enhancement seen within the ventral epidural space, extending into the bilateral L3-4 neural foramina without frank epidural abscess or collection. Associated paraspinous edema and enhancement within the adjacent psoas musculature bilaterally. Superimposed paraspinous abscess within the right psoas muscle measures 1.3 x 1.0 x 2.7 cm (AP by transverse by craniocaudad, series 7, image 25). No other discrete soft tissue collections. Mild edema and enhancement noted about the bilateral L3-4 facets as well. No other evidence for acute infection within the lumbar spine. Vertebral body height maintained at this time without evidence for acute or chronic fracture. Underlying bone marrow signal intensity diffusely decreased on T1 weighted imaging. Small benign hemangioma noted within the T12 vertebral body. No worrisome osseous lesions. Conus medullaris: Extends to the L1 level and appears normal. Paraspinal and other soft tissues: Paraspinous edema and enhancement adjacent to the L3-4 interspace with superimposed right psoas muscle abscess as above. Soft tissue  edema within the posterior paraspinous musculature extending from L3-4 inferiorly could be reactive in nature or reflect associated myositis (series 2, images 6, 12). No discrete collections within this region.  Retroperitoneal lymph nodes measure up to 16 mm in short axis (series 4, image 15), indeterminate, but could be reactive. Visualized visceral structures grossly unremarkable. Disc levels: L1-2:  Unremarkable. L2-3:  Unremarkable. L3-4: Findings consistent with acute osteomyelitis discitis. Associated loss of L3-4 disc height with diffuse disc bulging. Enhancement within the ventral epidural space extending into the bilateral L3-4 neural foramina without frank epidural abscess. Resultant mild spinal stenosis. Mild to moderate bilateral L3 neural foraminal narrowing. L4-5:  Unremarkable. L5-S1:  Unremarkable. IMPRESSION: 1. Findings consistent with acute osteomyelitis discitis involving the L3-4 level. Associated enhancement involving the adjacent ventral epidural space without discrete epidural abscess or collection. Mild spinal stenosis at this level. 2. Paraspinous phlegmon and edema adjacent to the L3-4 level with superimposed 1.3 x 1.0 x 2.7 cm right psoas muscle abscess as above. 3. No other evidence for acute infection within the thoracolumbar spine. 4. Multifocal small disc protrusions involving the mid-thoracic spine as above without significant stenosis. Electronically Signed   By: Rise Mu M.D.   On: 12/23/2017 13:47    Procedures .Critical Care Performed by: Blane Ohara, MD Authorized by: Blane Ohara, MD   Critical care provider statement:    Critical care time (minutes):  75   Critical care start time:  12/23/2017 11:30 AM   Critical care end time:  12/23/2017 12:45 PM   Critical care time was exclusive of:  Separately billable procedures and treating other patients and teaching time   Critical care was necessary to treat or prevent imminent or life-threatening deterioration of the following conditions:  Sepsis   Critical care was time spent personally by me on the following activities:  Discussions with consultants, evaluation of patient's response to treatment, examination  of patient, ordering and performing treatments and interventions, ordering and review of laboratory studies, ordering and review of radiographic studies, pulse oximetry, re-evaluation of patient's condition, obtaining history from patient or surrogate and review of old charts   I assumed direction of critical care for this patient from another provider in my specialty: no     (including critical care time)  Medications Ordered in ED Medications  fentaNYL (SUBLIMAZE) injection 50 mcg (50 mcg Intravenous Given 12/23/17 1054)  sodium chloride 0.9 % bolus 1,000 mL (0 mLs Intravenous Stopped 12/23/17 1100)  acetaminophen (TYLENOL) tablet 1,000 mg (1,000 mg Oral Given 12/23/17 0958)  levofloxacin (LEVAQUIN) IVPB 750 mg (0 mg Intravenous Stopped 12/23/17 1407)  aztreonam (AZACTAM) 2 g in sodium chloride 0.9 % 100 mL IVPB (0 g Intravenous Stopped 12/23/17 1121)  vancomycin (VANCOCIN) 2,000 mg in sodium chloride 0.9 % 500 mL IVPB (0 mg Intravenous Stopped 12/23/17 1407)  sodium chloride 0.9 % bolus 1,000 mL (0 mLs Intravenous Stopped 12/23/17 1200)  gadobutrol (GADAVIST) 1 MMOL/ML injection 7 mL (7 mLs Intravenous Contrast Given 12/23/17 1248)  sodium chloride 0.9 % bolus 500 mL (500 mLs Intravenous New Bag/Given 12/23/17 1330)     Initial Impression / Assessment and Plan / ED Course  I have reviewed the triage vital signs and the nursing notes.  Pertinent labs & imaging results that were available during my care of the patient were reviewed by me and considered in my medical decision making (see chart for details).    Patient with IV drug abuse history presents with worsening back pain and fevers.  Patient is tachycardic, febrile and with persistent worsening symptoms concern for sepsis.  Sepsis order set utilized including 30 cc/kg of IV fluids, broad IV antibiotics, cultures.  Chest x-ray no obvious pneumonia.  MRI pending.  Patient will need admission to the hospital pending results. Blood work reviewed  showing leukocytosis.  MRI results reviewed concerning for psoas abscess and osteomyelitis of lumbar spine.  Discussed with hospitalist who will help with admission/transfer if needed to [facility].  On reassessment patient's heart rate improved, lungs clear, mild tachypnea, normal perfusion.  The patients results and plan were reviewed and discussed.   Any x-rays performed were independently reviewed by myself.   Differential diagnosis were considered with the presenting HPI.  Medications  fentaNYL (SUBLIMAZE) injection 50 mcg (50 mcg Intravenous Given 12/23/17 1054)  sodium chloride 0.9 % bolus 1,000 mL (0 mLs Intravenous Stopped 12/23/17 1100)  acetaminophen (TYLENOL) tablet 1,000 mg (1,000 mg Oral Given 12/23/17 0958)  levofloxacin (LEVAQUIN) IVPB 750 mg (0 mg Intravenous Stopped 12/23/17 1407)  aztreonam (AZACTAM) 2 g in sodium chloride 0.9 % 100 mL IVPB (0 g Intravenous Stopped 12/23/17 1121)  vancomycin (VANCOCIN) 2,000 mg in sodium chloride 0.9 % 500 mL IVPB (0 mg Intravenous Stopped 12/23/17 1407)  sodium chloride 0.9 % bolus 1,000 mL (0 mLs Intravenous Stopped 12/23/17 1200)  gadobutrol (GADAVIST) 1 MMOL/ML injection 7 mL (7 mLs Intravenous Contrast Given 12/23/17 1248)  sodium chloride 0.9 % bolus 500 mL (500 mLs Intravenous New Bag/Given 12/23/17 1330)    Vitals:   12/23/17 1100 12/23/17 1130 12/23/17 1200 12/23/17 1400  BP: 108/67 110/62 111/63   Pulse: (!) 116 (!) 110 (!) 106 97  Resp: (!) 24 (!) 22 (!) 22 (!) 22  Temp:      TempSrc:      SpO2: 95% 97% 99% 99%  Weight:      Height:        Final diagnoses:  Psoas abscess, right (HCC)  IV drug user  Acute osteomyelitis of lumbar spine (HCC)    Admission/ observation were discussed with the admitting physician, patient and/or family and they are comfortable with the plan.    Final Clinical Impressions(s) / ED Diagnoses   Final diagnoses:  Psoas abscess, right (HCC)  IV drug user  Acute osteomyelitis of lumbar spine  [facility])    ED Discharge Orders    None       Blane Ohara, MD 12/23/17 [phone]

## 2017-12-24 ENCOUNTER — Inpatient Hospital Stay (HOSPITAL_COMMUNITY): Payer: Medicaid - Out of State

## 2017-12-24 DIAGNOSIS — R7881 Bacteremia: Secondary | ICD-10-CM

## 2017-12-24 DIAGNOSIS — M4620 Osteomyelitis of vertebra, site unspecified: Secondary | ICD-10-CM

## 2017-12-24 DIAGNOSIS — M4640 Discitis, unspecified, site unspecified: Secondary | ICD-10-CM

## 2017-12-24 DIAGNOSIS — B9561 Methicillin susceptible Staphylococcus aureus infection as the cause of diseases classified elsewhere: Secondary | ICD-10-CM

## 2017-12-24 DIAGNOSIS — M4626 Osteomyelitis of vertebra, lumbar region: Secondary | ICD-10-CM

## 2017-12-24 DIAGNOSIS — I361 Nonrheumatic tricuspid (valve) insufficiency: Secondary | ICD-10-CM

## 2017-12-24 DIAGNOSIS — K6812 Psoas muscle abscess: Secondary | ICD-10-CM

## 2017-12-24 LAB — ECHOCARDIOGRAM COMPLETE
Height: 71 in
Weight: 2730.18 oz

## 2017-12-24 LAB — BLOOD CULTURE ID PANEL (REFLEXED)
Acinetobacter baumannii: NOT DETECTED
CANDIDA PARAPSILOSIS: NOT DETECTED
Candida albicans: NOT DETECTED
Candida glabrata: NOT DETECTED
Candida krusei: NOT DETECTED
Candida tropicalis: NOT DETECTED
Enterobacter cloacae complex: NOT DETECTED
Enterobacteriaceae species: NOT DETECTED
Enterococcus species: NOT DETECTED
Escherichia coli: NOT DETECTED
HAEMOPHILUS INFLUENZAE: NOT DETECTED
KLEBSIELLA OXYTOCA: NOT DETECTED
KLEBSIELLA PNEUMONIAE: NOT DETECTED
Listeria monocytogenes: NOT DETECTED
METHICILLIN RESISTANCE: NOT DETECTED
Neisseria meningitidis: NOT DETECTED
Proteus species: NOT DETECTED
Pseudomonas aeruginosa: NOT DETECTED
SERRATIA MARCESCENS: NOT DETECTED
STAPHYLOCOCCUS AUREUS BCID: DETECTED — AB
STREPTOCOCCUS PYOGENES: NOT DETECTED
Staphylococcus species: DETECTED — AB
Streptococcus agalactiae: NOT DETECTED
Streptococcus pneumoniae: NOT DETECTED
Streptococcus species: NOT DETECTED

## 2017-12-24 LAB — CBC
HEMATOCRIT: 31.2 % — AB (ref 39.0–52.0)
Hemoglobin: 9.9 g/dL — ABNORMAL LOW (ref 13.0–17.0)
MCH: 26.9 pg (ref 26.0–34.0)
MCHC: 31.7 g/dL (ref 30.0–36.0)
MCV: 84.8 fL (ref 80.0–100.0)
NRBC: 0 % (ref 0.0–0.2)
Platelets: 152 10*3/uL (ref 150–400)
RBC: 3.68 MIL/uL — ABNORMAL LOW (ref 4.22–5.81)
RDW: 13.2 % (ref 11.5–15.5)
WBC: 12.9 10*3/uL — ABNORMAL HIGH (ref 4.0–10.5)

## 2017-12-24 LAB — RAPID URINE DRUG SCREEN, HOSP PERFORMED
AMPHETAMINES: NOT DETECTED
Barbiturates: NOT DETECTED
Benzodiazepines: POSITIVE — AB
Cocaine: NOT DETECTED
Opiates: POSITIVE — AB
Tetrahydrocannabinol: POSITIVE — AB

## 2017-12-24 LAB — URINE CULTURE: CULTURE: NO GROWTH

## 2017-12-24 LAB — BASIC METABOLIC PANEL
ANION GAP: 11 (ref 5–15)
BUN: 27 mg/dL — ABNORMAL HIGH (ref 6–20)
CALCIUM: 8.1 mg/dL — AB (ref 8.9–10.3)
CHLORIDE: 105 mmol/L (ref 98–111)
CO2: 21 mmol/L — AB (ref 22–32)
Creatinine, Ser: 1.42 mg/dL — ABNORMAL HIGH (ref 0.61–1.24)
GFR calc Af Amer: >60 mL/min (ref >60)
GFR calc non Af Amer: >60 mL/min (ref >60)
Glucose, Bld: 103 mg/dL — ABNORMAL HIGH (ref 70–99)
Potassium: 3.6 mmol/L (ref 3.5–5.1)
Sodium: 137 mmol/L (ref 135–145)

## 2017-12-24 LAB — LACTIC ACID, PLASMA: Lactic Acid, Venous: 2.5 mmol/L (ref 0.5–1.9)

## 2017-12-24 LAB — HIV ANTIBODY (ROUTINE TESTING W REFLEX): HIV Screen 4th Generation wRfx: NONREACTIVE

## 2017-12-24 MED ORDER — SODIUM CHLORIDE 0.9 % IV BOLUS
1000.0000 mL | Freq: Once | INTRAVENOUS | Status: AC
  Administered 2017-12-24: 1000 mL via INTRAVENOUS

## 2017-12-24 MED ORDER — VANCOMYCIN HCL IN DEXTROSE 1-5 GM/200ML-% IV SOLN
1000.0000 mg | Freq: Two times a day (BID) | INTRAVENOUS | Status: DC
  Administered 2017-12-24 – 2017-12-25 (×2): 1000 mg via INTRAVENOUS
  Filled 2017-12-24 (×2): qty 200

## 2017-12-24 MED ORDER — SODIUM CHLORIDE 0.9 % IV SOLN
INTRAVENOUS | Status: AC
  Administered 2017-12-24: 10:00:00 via INTRAVENOUS

## 2017-12-24 MED ORDER — ZOLPIDEM TARTRATE 5 MG PO TABS
5.0000 mg | ORAL_TABLET | Freq: Every evening | ORAL | Status: DC | PRN
  Administered 2017-12-24 – 2018-01-10 (×11): 5 mg via ORAL
  Filled 2017-12-24 (×11): qty 1

## 2017-12-24 MED ORDER — LOPERAMIDE HCL 2 MG PO CAPS
2.0000 mg | ORAL_CAPSULE | ORAL | Status: DC | PRN
  Administered 2017-12-24 – 2017-12-25 (×4): 2 mg via ORAL
  Filled 2017-12-24 (×4): qty 1

## 2017-12-24 NOTE — Consult Note (Signed)
[facility] Staph aureus note  Has MSSA bacteremia with osteomyelitis, discitis associated with phlegmon and psoas abscess.  Will need prolonged IV vancomycin (allergy), 6-8 weeks for best chance of cure.  TTE.  Can consider TEE for endocarditis though prolonged IV antibiotics recommended regardless.   If no endocarditis on TEE, could consider 4 weeks of IV antibiotics followed by 2 weeks of oral linezolid.    Gardiner Barefoot, MD

## 2017-12-24 NOTE — Progress Notes (Signed)
PROGRESS NOTE    Howard Diaz  QMV:[id number] DOB: [birth year] DOA: 12/23/2017 PCP: Patient, No Pcp Per   Brief Narrative:  Per HPI from Dr. Gwenlyn Perking 11/5: Howard Diaz is a 32 y.o. male with a past medical history significant for alcohol use, history of hepatitis C, acute hepatic toxicity felt to be a combination of hepatitis C and alcohol abuse (he required hospitalization at that time and experienced jaundice), also with History of tobacco abuse and IV drug use; who presented to the emergency department secondary to lower back pain, weakness, fever and general malaise.  Patient reports back pain has been present for the last week or so and worsening.  Pain is localized in the lower aspect of his back exacerbated by any kind of movement, reports sudden intermittent radiation across his back and reported some numbness feeling. Patient has had anorexia and reported some loose stools.  Patient denies any chest pain, cough, dysuria, hematuria, melena, hematochezia, abdominal pain, nausea, vomiting or any other complaints.  He was seen in the emergency department (outside facility), where he was diagnosed with fine hair fracture after having a CT scan of his lower protuberance.  Patient was discharged home on Flexeril and pain medication.  He reported no improvement in symptoms and given the initiation of fever decide to seek further medical care.  He was admitted with sepsis secondary to acute osteomyelitis of the lumbar spine and initially started on vancomycin and aztreonam due to history of anaphylaxis when exposed to penicillin.  He is also now noted to have MSSA bacteremia and will simply remain on IV vancomycin.  He will require eventual TEE to determine the most efficient course of treatment.   Assessment & Plan:   Principal Problem:   Acute osteomyelitis of lumbar spine (HCC) Active Problems:   IV drug user   Hepatitis-C   Lactic acid acidosis   Sepsis (HCC)   Hypokalemia  Tobacco abuse  1. Sepsis secondary to MSSA osteomyelitis/paraspinous abscess of lumbar spine with confirmed MSSA bacteremia.  This is likely secondary to prior history of IV drug use and patient will therefore need to remain in house for full course of treatment.  Cardiology consulted today for assessment of endocarditis via TEE and will obtain TTE today.  Patient will apparently require a minimum of 4 weeks of antibiotics depending on the results of TEE.  Recheck labs in a.m. 2. AKI.  Unknown baseline previously noted with creatinine of 2.5 on admission that has improved with hydration to 1.42.  Vancomycin being followed by pharmacy.  Avoid nephrotoxic agents and recheck renal panel in a.m.  Will avoid renal ultrasound at this time since creatinine is improving with IV fluid hydration. 3. Lactic acidosis.  This is likely secondary to sepsis as noted above.  This has improved after aggressive IV fluid hydration.  Recheck in a.m. 4. Hypokalemia.  Repleted and resolved.  Will recheck in a.m.  Magnesium within normal limits.  Likely related to GI loss and currently denies any diarrhea or abdominal pain.  Will order Imodium as needed. 5. History of IV drug use.  UDS ordered and is pending. 6. History of hepatitis C.  RNA quantification currently pending to determine need for treatment.  No signs of jaundice.  No significant LFT elevation noted. 7. Tobacco abuse.  Counseled on cessation with nicotine patch ordered.    DVT prophylaxis: Heparin Code Status: Full Family Communication: Mother at bedside Disposition Plan: Continue treatment with IV antibiotics for the next 4 to 6 weeks  depending on further results of TEE.  Cardiology consulted.   Consultants:   ID on phone  Cardiology  Procedures:   None  Antimicrobials:   Vancomycin 11/5->  Aztreonam 11/5-11/6   Subjective: Patient seen and evaluated today with no new acute complaints or concerns. No acute concerns or events noted  overnight.  He continues to have ongoing back pain and is quite weak in his extremities.  Objective: Vitals:   12/23/17 1844 12/23/17 1935 12/23/17 2102 12/24/17 0533  BP:   (!) 99/57 117/62  Pulse:   95 (!) 112  Resp:   18 18  Temp: (!) 101.6 F (38.7 C)  98.7 F (37.1 C) 99.1 F (37.3 C)  TempSrc: Axillary  Oral Oral  SpO2:  99% 96% 97%  Weight:      Height:        Intake/Output Summary (Last 24 hours) at 12/24/2017 0951 Last data filed at 12/24/2017 0945 Gross per 24 hour  Intake 4170.62 ml  Output 1100 ml  Net 3070.62 ml   Filed Weights   12/23/17 0931 12/23/17 1627  Weight: 81.6 kg 77.4 kg    Examination:  General exam: Appears calm and comfortable  Respiratory system: Clear to auscultation. Respiratory effort normal. Cardiovascular system: S1 & S2 heard, RRR. No JVD, murmurs, rubs, gallops or clicks. No pedal edema. Gastrointestinal system: Abdomen is nondistended, soft and nontender. No organomegaly or masses felt. Normal bowel sounds heard. Central nervous system: Alert and oriented. No focal neurological deficits. Extremities: Symmetric 5 x 5 power. Skin: No rashes, lesions or ulcers Psychiatry: Judgement and insight appear normal. Mood & affect appropriate.     Data Reviewed: I have personally reviewed following labs and imaging studies  CBC: Recent Labs  Lab 12/23/17 0950 12/24/17 0410  WBC 15.6* 12.9*  NEUTROABS 13.5*  --   HGB 12.5* 9.9*  HCT 38.8* 31.2*  MCV 83.4 84.8  PLT 162 152   Basic Metabolic Panel: Recent Labs  Lab 12/23/17 1200 12/23/17 1523 12/24/17 0410  NA 133*  --  137  K 2.9*  --  3.6  CL 100  --  105  CO2 16*  --  21*  GLUCOSE 126*  --  103*  BUN 37*  --  27*  CREATININE 2.52*  --  1.42*  CALCIUM 6.8*  --  8.1*  MG  --  2.1  --    GFR: Estimated Creatinine Clearance: 78.8 mL/min (A) (by C-G formula based on SCr of 1.42 mg/dL (H)). Liver Function Tests: Recent Labs  Lab 12/23/17 1200  AST 101*  ALT 42  ALKPHOS  143*  BILITOT 1.5*  PROT 6.5  ALBUMIN 2.0*   No results for input(s): LIPASE, AMYLASE in the last 168 hours. No results for input(s): AMMONIA in the last 168 hours. Coagulation Profile: No results for input(s): INR, PROTIME in the last 168 hours. Cardiac Enzymes: No results for input(s): CKTOTAL, CKMB, CKMBINDEX, TROPONINI in the last 168 hours. BNP (last 3 results) No results for input(s): PROBNP in the last 8760 hours. HbA1C: No results for input(s): HGBA1C in the last 72 hours. CBG: No results for input(s): GLUCAP in the last 168 hours. Lipid Profile: No results for input(s): CHOL, HDL, LDLCALC, TRIG, CHOLHDL, LDLDIRECT in the last 72 hours. Thyroid Function Tests: No results for input(s): TSH, T4TOTAL, FREET4, T3FREE, THYROIDAB in the last 72 hours. Anemia Panel: No results for input(s): VITAMINB12, FOLATE, FERRITIN, TIBC, IRON, RETICCTPCT in the last 72 hours. Sepsis Labs: Recent Labs  Lab 12/23/17 0952 12/23/17 1431  LATICACIDVEN 4.09* 1.46    Recent Results (from the past 240 hour(s))  Blood Culture (routine x 2)     Status: Abnormal (Preliminary result)   Collection Time: 12/23/17  9:50 AM  Result Value Ref Range Status   Specimen Description   Final    BLOOD LEFT HAND Performed at [facility], [address], [place], [place] [address]    Special Requests   Final    BOTTLES DRAWN AEROBIC AND ANAEROBIC Blood Culture adequate volume Performed at [facility], [address], [place], [place] [address]    Culture  Setup Time   Final    GRAM POSITIVE COCCI Gram Stain Report Called to,Read Back By and Verified With: JOHNSON,B. AT 2028 ON 12/23/2017 BY EVA ANAEROBIC BOTTLE ONLY Performed at [facility] CRITICAL RESULT CALLED TO, READ BACK BY AND VERIFIED WITHGean Quint RN [id number] 12/24/17 A BROWNING    Culture (A)  Final    STAPHYLOCOCCUS AUREUS TOO YOUNG TO READ Performed at [facility], [address] [place], [place] [address]    Report Status  PENDING  Incomplete  Blood Culture (routine x 2)     Status: None (Preliminary result)   Collection Time: 12/23/17  9:50 AM  Result Value Ref Range Status   Specimen Description   Final    BLOOD SITE NOT SPECIFIED DRAWN BY RN Performed at [facility], [address] [place], [place] [address]    Special Requests   Final    BOTTLES DRAWN AEROBIC AND ANAEROBIC Blood Culture results may not be optimal due to an inadequate volume of blood received in culture bottles Performed at [facility], [address] [place], [place] [address]    Culture  Setup Time   Final    GRAM POSITIVE COCCI Gram Stain Report Called to,Read Back By and Verified With: JOHNSON,B. 2129 ON 12/23/2017 BY EVA Performed at [facility] CRITICAL RESULT CALLED TO, READ BACK BY AND VERIFIED WITHGean Quint RN [id number] 12/24/17 A BROWNING AEROBIC AND ANAEROBIC BOTTLES Performed at [facility], [address], [place], [place] [address]    Culture   Final    TOO YOUNG TO READ Performed at [facility], [address] [place], [place] [address]    Report Status PENDING  Incomplete  Blood Culture ID Panel (Reflexed)     Status: Abnormal   Collection Time: 12/23/17  9:50 AM  Result Value Ref Range Status   Enterococcus species NOT DETECTED NOT DETECTED Final   Listeria monocytogenes NOT DETECTED NOT DETECTED Final   Staphylococcus species DETECTED (A) NOT DETECTED Final    Comment: CRITICAL RESULT CALLED TO, READ BACK BY AND VERIFIED WITHGean Quint RN [id number] 12/24/17 A BROWNING    Staphylococcus aureus (BCID) DETECTED (A) NOT DETECTED Final    Comment: Methicillin (oxacillin) susceptible Staphylococcus aureus (MSSA). Preferred therapy is anti staphylococcal beta lactam antibiotic (Cefazolin or Nafcillin), unless clinically contraindicated. CRITICAL RESULT CALLED TO, READ BACK BY AND VERIFIED WITHGean Quint RN [phone] 12/24/17 A BROWNING    Methicillin resistance NOT DETECTED NOT DETECTED Final   Streptococcus  species NOT DETECTED NOT DETECTED Final   Streptococcus agalactiae NOT DETECTED NOT DETECTED Final   Streptococcus pneumoniae NOT DETECTED NOT DETECTED Final   Streptococcus pyogenes NOT DETECTED NOT DETECTED Final   Acinetobacter baumannii NOT DETECTED NOT DETECTED Final   Enterobacteriaceae species NOT DETECTED NOT DETECTED Final   Enterobacter cloacae complex NOT  DETECTED NOT DETECTED Final   Escherichia coli NOT DETECTED NOT DETECTED Final   Klebsiella oxytoca NOT DETECTED NOT DETECTED Final   Klebsiella pneumoniae NOT DETECTED NOT DETECTED Final   Proteus species NOT DETECTED NOT DETECTED Final   Serratia marcescens NOT DETECTED NOT DETECTED Final   Haemophilus influenzae NOT DETECTED NOT DETECTED Final   Neisseria meningitidis NOT DETECTED NOT DETECTED Final   Pseudomonas aeruginosa NOT DETECTED NOT DETECTED Final   Candida albicans NOT DETECTED NOT DETECTED Final   Candida glabrata NOT DETECTED NOT DETECTED Final   Candida krusei NOT DETECTED NOT DETECTED Final   Candida parapsilosis NOT DETECTED NOT DETECTED Final   Candida tropicalis NOT DETECTED NOT DETECTED Final    Comment: Performed at [facility], [address] [place], [place] [address]         Radiology Studies: Dg Chest 2 View  Result Date: 12/23/2017 CLINICAL DATA:  Fever and body aches. EXAM: CHEST - 2 VIEW COMPARISON:  None. FINDINGS: The heart is normal in size given the AP projection. The mediastinal and hilar contours appear normal. Streaky areas of subsegmental atelectasis are noted. No definite infiltrates or effusions. The bony thorax is intact. IMPRESSION: Low lung volumes with streaky areas of subsegmental atelectasis but no definite infiltrates or effusions. Electronically Signed   By: Rudie Meyer M.D.   On: 12/23/2017 10:35   Mr Thoracic Spine W Wo Contrast  Result Date: 12/23/2017 CLINICAL DATA:  Initial evaluation for progressive back pain, fever, history of IV drug abuse. EXAM: MRI  THORACIC AND LUMBAR SPINE WITHOUT AND WITH CONTRAST TECHNIQUE: Multiplanar and multiecho pulse sequences of the thoracic and lumbar spine were obtained without and with intravenous contrast. CONTRAST:  7 cc Gadavist. COMPARISON:  None available. FINDINGS: MRI THORACIC SPINE FINDINGS Alignment: Examination technically limited by you technique in large field-of-view on sagittal sequences. Mild scoliosis. Alignment otherwise normal with preservation of the normal thoracic kyphosis. No listhesis or malalignment. Vertebrae: Vertebral body heights maintained without evidence for acute or chronic fracture. Bone marrow signal intensity diffusely decreased on T1 weighted imaging, most commonly related to anemia, smoking, or obesity. Few small benign hemangiomas noted within the lower thoracic spine. No worrisome osseous lesions. No evidence for osteomyelitis discitis or septic arthritis within the thoracic spine. Cord: Signal intensity within the thoracic spinal cord is normal. No epidural collections. Paraspinal and other soft tissues: Paraspinous soft tissues demonstrate no acute finding. Atelectasis versus consolidation noted within the posterior left lung base. Visualized lungs are otherwise grossly clear. Visualized visceral structures grossly unremarkable. Disc levels: T1-2:  Unremarkable. T2-3: Unremarkable. T3-4:  Unremarkable. T4-5: Shallow left paracentral disc protrusion mildly indents the left ventral thecal sac. Minimal flattening of the left ventral cord without significant stenosis. T5-6:  Unremarkable. T6-7: Right paracentral disc protrusion with slight superior migration. Protruding disc contacts the right ventral cord with secondary mild cord flattening. No significant stenosis. T7-8: Shallow right paracentral disc protrusion abuts the right ventral cord. No significant stenosis or cord deformity. T8-9: Shallow left paracentral disc protrusion mildly indents the left ventral thecal sac. Minimal flattening  of the left ventral cord without significant stenosis. T9-10: Unremarkable. T10-11: Minimal disc bulge. Mild posterior element hypertrophy. No significant stenosis. T11-12: Mild posterior element hypertrophy. No significant stenosis. T12-L1:  Unremarkable. MRI LUMBAR SPINE FINDINGS Segmentation: Normal segmentation. Lowest well-formed disc labeled the L5-S1 level. Alignment: Straightening of the normal lumbar lordosis with trace dextroscoliosis. No listhesis or malalignment. Vertebrae: Abnormal edema and enhancement seen within the L3  and L4 vertebral bodies centered about the L3-4 disc space. Abnormal edema with loss of normal disc height seen within the L3-4 disc itself. Findings consistent with acute osteomyelitis discitis. Mild diffuse enhancement seen within the ventral epidural space, extending into the bilateral L3-4 neural foramina without frank epidural abscess or collection. Associated paraspinous edema and enhancement within the adjacent psoas musculature bilaterally. Superimposed paraspinous abscess within the right psoas muscle measures 1.3 x 1.0 x 2.7 cm (AP by transverse by craniocaudad, series 7, image 25). No other discrete soft tissue collections. Mild edema and enhancement noted about the bilateral L3-4 facets as well. No other evidence for acute infection within the lumbar spine. Vertebral body height maintained at this time without evidence for acute or chronic fracture. Underlying bone marrow signal intensity diffusely decreased on T1 weighted imaging. Small benign hemangioma noted within the T12 vertebral body. No worrisome osseous lesions. Conus medullaris: Extends to the L1 level and appears normal. Paraspinal and other soft tissues: Paraspinous edema and enhancement adjacent to the L3-4 interspace with superimposed right psoas muscle abscess as above. Soft tissue edema within the posterior paraspinous musculature extending from L3-4 inferiorly could be reactive in nature or reflect  associated myositis (series 2, images 6, 12). No discrete collections within this region. Retroperitoneal lymph nodes measure up to 16 mm in short axis (series 4, image 15), indeterminate, but could be reactive. Visualized visceral structures grossly unremarkable. Disc levels: L1-2:  Unremarkable. L2-3:  Unremarkable. L3-4: Findings consistent with acute osteomyelitis discitis. Associated loss of L3-4 disc height with diffuse disc bulging. Enhancement within the ventral epidural space extending into the bilateral L3-4 neural foramina without frank epidural abscess. Resultant mild spinal stenosis. Mild to moderate bilateral L3 neural foraminal narrowing. L4-5:  Unremarkable. L5-S1:  Unremarkable. IMPRESSION: 1. Findings consistent with acute osteomyelitis discitis involving the L3-4 level. Associated enhancement involving the adjacent ventral epidural space without discrete epidural abscess or collection. Mild spinal stenosis at this level. 2. Paraspinous phlegmon and edema adjacent to the L3-4 level with superimposed 1.3 x 1.0 x 2.7 cm right psoas muscle abscess as above. 3. No other evidence for acute infection within the thoracolumbar spine. 4. Multifocal small disc protrusions involving the mid-thoracic spine as above without significant stenosis. Electronically Signed   By: Rise Mu M.D.   On: 12/23/2017 13:47   Mr Lumbar Spine W Wo Contrast  Result Date: 12/23/2017 CLINICAL DATA:  Initial evaluation for progressive back pain, fever, history of IV drug abuse. EXAM: MRI THORACIC AND LUMBAR SPINE WITHOUT AND WITH CONTRAST TECHNIQUE: Multiplanar and multiecho pulse sequences of the thoracic and lumbar spine were obtained without and with intravenous contrast. CONTRAST:  7 cc Gadavist. COMPARISON:  None available. FINDINGS: MRI THORACIC SPINE FINDINGS Alignment: Examination technically limited by you technique in large field-of-view on sagittal sequences. Mild scoliosis. Alignment otherwise normal  with preservation of the normal thoracic kyphosis. No listhesis or malalignment. Vertebrae: Vertebral body heights maintained without evidence for acute or chronic fracture. Bone marrow signal intensity diffusely decreased on T1 weighted imaging, most commonly related to anemia, smoking, or obesity. Few small benign hemangiomas noted within the lower thoracic spine. No worrisome osseous lesions. No evidence for osteomyelitis discitis or septic arthritis within the thoracic spine. Cord: Signal intensity within the thoracic spinal cord is normal. No epidural collections. Paraspinal and other soft tissues: Paraspinous soft tissues demonstrate no acute finding. Atelectasis versus consolidation noted within the posterior left lung base. Visualized lungs are otherwise grossly clear. Visualized visceral structures grossly unremarkable.  Disc levels: T1-2:  Unremarkable. T2-3: Unremarkable. T3-4:  Unremarkable. T4-5: Shallow left paracentral disc protrusion mildly indents the left ventral thecal sac. Minimal flattening of the left ventral cord without significant stenosis. T5-6:  Unremarkable. T6-7: Right paracentral disc protrusion with slight superior migration. Protruding disc contacts the right ventral cord with secondary mild cord flattening. No significant stenosis. T7-8: Shallow right paracentral disc protrusion abuts the right ventral cord. No significant stenosis or cord deformity. T8-9: Shallow left paracentral disc protrusion mildly indents the left ventral thecal sac. Minimal flattening of the left ventral cord without significant stenosis. T9-10: Unremarkable. T10-11: Minimal disc bulge. Mild posterior element hypertrophy. No significant stenosis. T11-12: Mild posterior element hypertrophy. No significant stenosis. T12-L1:  Unremarkable. MRI LUMBAR SPINE FINDINGS Segmentation: Normal segmentation. Lowest well-formed disc labeled the L5-S1 level. Alignment: Straightening of the normal lumbar lordosis with trace  dextroscoliosis. No listhesis or malalignment. Vertebrae: Abnormal edema and enhancement seen within the L3 and L4 vertebral bodies centered about the L3-4 disc space. Abnormal edema with loss of normal disc height seen within the L3-4 disc itself. Findings consistent with acute osteomyelitis discitis. Mild diffuse enhancement seen within the ventral epidural space, extending into the bilateral L3-4 neural foramina without frank epidural abscess or collection. Associated paraspinous edema and enhancement within the adjacent psoas musculature bilaterally. Superimposed paraspinous abscess within the right psoas muscle measures 1.3 x 1.0 x 2.7 cm (AP by transverse by craniocaudad, series 7, image 25). No other discrete soft tissue collections. Mild edema and enhancement noted about the bilateral L3-4 facets as well. No other evidence for acute infection within the lumbar spine. Vertebral body height maintained at this time without evidence for acute or chronic fracture. Underlying bone marrow signal intensity diffusely decreased on T1 weighted imaging. Small benign hemangioma noted within the T12 vertebral body. No worrisome osseous lesions. Conus medullaris: Extends to the L1 level and appears normal. Paraspinal and other soft tissues: Paraspinous edema and enhancement adjacent to the L3-4 interspace with superimposed right psoas muscle abscess as above. Soft tissue edema within the posterior paraspinous musculature extending from L3-4 inferiorly could be reactive in nature or reflect associated myositis (series 2, images 6, 12). No discrete collections within this region. Retroperitoneal lymph nodes measure up to 16 mm in short axis (series 4, image 15), indeterminate, but could be reactive. Visualized visceral structures grossly unremarkable. Disc levels: L1-2:  Unremarkable. L2-3:  Unremarkable. L3-4: Findings consistent with acute osteomyelitis discitis. Associated loss of L3-4 disc height with diffuse disc  bulging. Enhancement within the ventral epidural space extending into the bilateral L3-4 neural foramina without frank epidural abscess. Resultant mild spinal stenosis. Mild to moderate bilateral L3 neural foraminal narrowing. L4-5:  Unremarkable. L5-S1:  Unremarkable. IMPRESSION: 1. Findings consistent with acute osteomyelitis discitis involving the L3-4 level. Associated enhancement involving the adjacent ventral epidural space without discrete epidural abscess or collection. Mild spinal stenosis at this level. 2. Paraspinous phlegmon and edema adjacent to the L3-4 level with superimposed 1.3 x 1.0 x 2.7 cm right psoas muscle abscess as above. 3. No other evidence for acute infection within the thoracolumbar spine. 4. Multifocal small disc protrusions involving the mid-thoracic spine as above without significant stenosis. Electronically Signed   By: Rise Mu M.D.   On: 12/23/2017 13:47        Scheduled Meds: . heparin injection (subcutaneous)  5,000 Units Subcutaneous Q8H  . nicotine  21 mg Transdermal Daily  . saccharomyces boulardii  250 mg Oral BID  . sodium chloride flush  3 mL Intravenous Q12H   Continuous Infusions: . sodium chloride 100 mL/hr at 12/23/17 1857  . vancomycin 750 mg (12/24/17 0100)     LOS: 1 day    Time spent: 30 minutes    Estiven Kohan Hoover Brunette, DO [facility] Pager [phone]  If 7PM-7AM, please contact night-coverage [email or web address] Password [email or web address] 12/24/2017, 9:51 AM

## 2017-12-24 NOTE — Progress Notes (Signed)
CRITICAL VALUE ALERT  Critical Value:  Lactic Acid 2.5  Date & Time Notied:  12/24/17 1841  Provider Notified: Schorr  Orders Received/Actions taken: Sodium Chloride 0.9% bolus 1,000 mL at 500 mL/hr

## 2017-12-24 NOTE — Progress Notes (Signed)
*  PRELIMINARY RESULTS* Echocardiogram 2D Echocardiogram has been performed.  Stacey Drain 12/24/2017, 2:47 PM

## 2017-12-24 NOTE — Progress Notes (Signed)
Bedside commode and floor mat placed at bedside via doctor's orders.

## 2017-12-24 NOTE — Progress Notes (Addendum)
    [facility] has been requested to perform a transesophageal echocardiogram on Howard Diaz for bacteremia. After careful review of history and examination, the risks and benefits of transesophageal echocardiogram have been explained including risks of esophageal damage, perforation (1:10,000 risk), bleeding, pharyngeal hematoma as well as other potential complications associated with conscious sedation including aspiration, arrhythmia, respiratory failure and death. Alternatives to treatment were discussed, questions were answered. Patient is willing to proceed. Reviewed with patient's mom at the bedside as well.   BP has been stable within the past 24 hours without the need for pressor support. Hgb 9.9 this AM (would repeat CBC in AM) and platelets 152 K.  Scheduled for 12/25/2017 at 9:15 with Dr. Diona Browner. NPO after midnight leading up to the procedure.   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  In light of this finding he therefore will require long-term antibiotics as per ID recommendations.  We will hold off on TEE at this time as it is unlikely to change initial management strategy.  Jonelle Sidle, M.D., F.A.C.C.

## 2017-12-24 NOTE — Progress Notes (Signed)
Notified mid-level and MD or patient's blood cultures with gram + cocci, and BCID  postiive for staph aureaus.

## 2017-12-25 ENCOUNTER — Other Ambulatory Visit (HOSPITAL_COMMUNITY): Payer: Self-pay

## 2017-12-25 ENCOUNTER — Encounter (HOSPITAL_COMMUNITY)
Admission: EM | Disposition: A | Discharge status: Home / Self Care | Payer: Self-pay | Source: Home / Self Care | Attending: Family Medicine

## 2017-12-25 ENCOUNTER — Inpatient Hospital Stay (HOSPITAL_COMMUNITY): Payer: Medicaid - Out of State

## 2017-12-25 LAB — BASIC METABOLIC PANEL
ANION GAP: 8 (ref 5–15)
BUN: 16 mg/dL (ref 6–20)
CHLORIDE: 108 mmol/L (ref 98–111)
CO2: 20 mmol/L — ABNORMAL LOW (ref 22–32)
Calcium: 7.9 mg/dL — ABNORMAL LOW (ref 8.9–10.3)
Creatinine, Ser: 1 mg/dL (ref 0.61–1.24)
GFR calc non Af Amer: >60 mL/min (ref >60)
Glucose, Bld: 103 mg/dL — ABNORMAL HIGH (ref 70–99)
POTASSIUM: 2.7 mmol/L — AB (ref 3.5–5.1)
SODIUM: 136 mmol/L (ref 135–145)

## 2017-12-25 LAB — CBC
HCT: 26.1 % — ABNORMAL LOW (ref 39.0–52.0)
HEMOGLOBIN: 8.4 g/dL — AB (ref 13.0–17.0)
MCH: 27.4 pg (ref 26.0–34.0)
MCHC: 32.2 g/dL (ref 30.0–36.0)
MCV: 85 fL (ref 80.0–100.0)
Platelets: 176 10*3/uL (ref 150–400)
RBC: 3.07 MIL/uL — AB (ref 4.22–5.81)
RDW: 13.3 % (ref 11.5–15.5)
WBC: 12 10*3/uL — AB (ref 4.0–10.5)
nRBC: 0 % (ref 0.0–0.2)

## 2017-12-25 LAB — PHOSPHORUS: Phosphorus: 2.4 mg/dL — ABNORMAL LOW (ref 2.5–4.6)

## 2017-12-25 LAB — LACTIC ACID, PLASMA: LACTIC ACID, VENOUS: 1.2 mmol/L (ref 0.5–1.9)

## 2017-12-25 LAB — HCV RNA QUANT: HCV Quantitative: NOT DETECTED IU/mL (ref >50)

## 2017-12-25 LAB — MAGNESIUM: Magnesium: 1.2 mg/dL — ABNORMAL LOW (ref 1.7–2.4)

## 2017-12-25 SURGERY — ECHOCARDIOGRAM, TRANSESOPHAGEAL
Anesthesia: Monitor Anesthesia Care

## 2017-12-25 MED ORDER — CEFAZOLIN SODIUM-DEXTROSE 2-4 GM/100ML-% IV SOLN
2.0000 g | Freq: Three times a day (TID) | INTRAVENOUS | Status: DC
  Administered 2017-12-25 – 2018-01-21 (×81): 2 g via INTRAVENOUS
  Filled 2017-12-25 (×95): qty 100

## 2017-12-25 MED ORDER — POTASSIUM CHLORIDE IN NACL 20-0.9 MEQ/L-% IV SOLN: INTRAVENOUS | Status: DC

## 2017-12-25 MED ORDER — POTASSIUM CHLORIDE CRYS ER 20 MEQ PO TBCR
40.0000 meq | EXTENDED_RELEASE_TABLET | Freq: Two times a day (BID) | ORAL | Status: DC
  Administered 2017-12-25: 40 meq via ORAL
  Filled 2017-12-25: qty 2

## 2017-12-25 MED ORDER — KETOROLAC TROMETHAMINE 30 MG/ML IJ SOLN
30.0000 mg | Freq: Once | INTRAMUSCULAR | Status: AC
  Administered 2017-12-25: 30 mg via INTRAVENOUS
  Filled 2017-12-25: qty 1

## 2017-12-25 MED ORDER — POTASSIUM CHLORIDE CRYS ER 20 MEQ PO TBCR
40.0000 meq | EXTENDED_RELEASE_TABLET | Freq: Once | ORAL | Status: AC
  Administered 2017-12-25: 40 meq via ORAL
  Filled 2017-12-25: qty 2

## 2017-12-25 MED ORDER — MAGNESIUM SULFATE 4 GM/100ML IV SOLN
4.0000 g | Freq: Once | INTRAVENOUS | Status: AC
  Administered 2017-12-25: 4 g via INTRAVENOUS
  Filled 2017-12-25: qty 100

## 2017-12-25 MED ORDER — POTASSIUM CHLORIDE IN NACL 40-0.9 MEQ/L-% IV SOLN
INTRAVENOUS | Status: DC
  Administered 2017-12-25: 100 mL/h via INTRAVENOUS

## 2017-12-25 MED ORDER — POTASSIUM CHLORIDE 10 MEQ/100ML IV SOLN
10.0000 meq | INTRAVENOUS | Status: AC
  Administered 2017-12-25 (×3): 10 meq via INTRAVENOUS
  Filled 2017-12-25 (×2): qty 100

## 2017-12-25 NOTE — Progress Notes (Signed)
Pharmacy Antibiotic Note  Howard Diaz is a 32 y.o. male admitted on 12/23/2017 with MSSA bacteremia/endocarditis.  Pharmacy has been consulted for cefazolin dosing.  Per patient, he has tolerated keflex in the past despite penicillin allergy.  Plan: Cefazolin 2000 mg IV every 8 hours. Monitor labs, c/s, and patient improvement.  Height: 5\' 11"  (180.3 cm) Weight: 170 lb 10.2 oz (77.4 kg) IBW/kg (Calculated) : 75.3  Temp (24hrs), Avg:100.1 F (37.8 C), Min:98.1 F (36.7 C), Max:102.5 F (39.2 C)  Recent Labs  Lab 12/23/17 0950 12/23/17 0952 12/23/17 1200 12/23/17 1431 12/24/17 0410 12/24/17 1841 12/25/17 0428  WBC 15.6*  --   --   --  12.9*  --  12.0*  CREATININE  --   --  2.52*  --  1.42*  --  1.00  LATICACIDVEN  --  4.09*  --  1.46  --  2.5* 1.2    Estimated Creatinine Clearance: 111.9 mL/min (by C-G formula based on SCr of 1 mg/dL).    Allergies  Allergen Reactions  . Penicillins Anaphylaxis    Has patient had a PCN reaction causing immediate rash, facial/tongue/throat swelling, SOB or lightheadedness with hypotension: Yes Has patient had a PCN reaction causing severe rash involving mucus membranes or skin necrosis: Yes Has patient had a PCN reaction that required hospitalization:Yes Has patient had a PCN reaction occurring within the last 10 years: No If all of the above answers are "NO", then may proceed with Cephalosporin use.     Antimicrobials this admission: Vanco 11/5 >> 11/7 Aztreonam 11/5 >> 11/6 Levaquin 11/5 >>11/5 Cefazolin 11/7 >>  Microbiology results: 11/5 BCx: MSSA 11/5 UCx: ng  Thank you for allowing pharmacy to be a part of this patient's care.  Tad Moore 12/25/2017 10:58 AM

## 2017-12-25 NOTE — Progress Notes (Signed)
Patient ID: Howard Diaz, male   DOB: 07-23-1985, 32 y.o.   MRN: [id number] Consult is been requested on the patient for left shoulder pain his history can be obtained from the medical record basically has a septic picture with multiple areas of possible septic foci  The MRI mentions a moderate amount of subdeltoid and subacromial fluid.  I only see some very small amounts of fluid.  Definitely not something that she drainable.  It would be nearly impossible to needle aspirate this amount of fluid.  The patient's myositis and septic picture more likely explain his lack of motion in his left arm the bone looks normal in terms of evidence of osteomyelitis  Full consult to follow but no drainable abscess present

## 2017-12-25 NOTE — Progress Notes (Signed)
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  Unfortunately the patient is not a candidate for home antibiotics due to his ongoing drug use. -Continue IV vancomycin started 12/23/2017, will clarify with the patient about his penicillin allergy as he would tolerate cefazolin better. -Blood cultures 12/23/2017 growing out MSSA - Repeat blood cultures today 12/25/2017 -Echo on 12/24/2017 shows evidence of tricuspid valve endocarditis -ID consult, recommendations pending -Cardiology consult, recommendations pending  #) Left shoulder pain/weakness: Patient has difficulty with abduction of left shoulder suggesting some injury to possibly the supraspinatus -Left shoulder MRI pending  #) L3/4 osteomyelitis, paraspinous phlegmon, right psoas muscle abscess: All likely secondary to endocarditis.  The abscess is not significant sufficiently large for drainage at this time. -Antibodies per above  #) IV drug use: Patient reports being abstinent for approximately 14 days though his UDS was positive. -Counseling provided -Patient does not want any medication for withdrawal  #) Chronic hepatitis C: -Outpatient treatment  Fluids: Tolerating p.o. Electrodes: Monitor and supplement Nutrition: Regular diet  Prophylaxis: Subcu heparin  Disposition: Unclear at this time  however will continue IV antibiotics, will likely need to complete inpatient course  Full code   Consultants:   Infectious disease  Cardiology  Procedures:  Echo 12/24/2017:- Left ventricle: The cavity size was normal. Wall thickness was   normal. Systolic function was normal. The estimated ejection   fraction was in the range of 55% to 60%. Wall motion was normal;   there were no regional wall motion abnormalities. - Mitral valve: There was trivial regurgitation. - Right atrium: Central venous pressure (est): 3 mm Hg. - Atrial septum: No defect or patent foramen ovale was identified. - Tricuspid valve: Large, homogeneous echodensity associated with   the atrial side of the septal tricuspid leaflet consistent with   vegetation in the setting of documented bacteremia. There was   moderate regurgitation. - Pulmonary arteries: PA peak pressure: 28 mm Hg (S). - Pericardium, extracardiac: A moderate pericardial effusion was   identified posterior to the heart.    Antimicrobials:   IV vancomycin started 12/23/2017  IV aztreonam 12/23/2017 to 12/24/2017   Subjective: This morning patient reports only severe back pain and left shoulder pain.  He is unable to abductor shoulder due to the pain.  He denies any nausea, vomiting, diarrhea, chest pain, cough, congestion.  Objective: Vitals:   12/24/17 1856 12/24/17 2050 12/25/17 0137 12/25/17 0531  BP:  (!) 103/56 130/72 101/63  Pulse:  84 100 77  Resp:  18 19 17   Temp: (!) 100.5 F (38.1 C) 98.6 F (37 C) (!) 102.1 F (38.9 C) 98.1 F (36.7 C)  TempSrc: Oral Oral  Oral  SpO2:  96% 95% 97%  Weight:      Height:        Intake/Output Summary (Last 24 hours) at 12/25/2017 0947 Last  data filed at 12/25/2017 0500 Gross per 24 hour  Intake 859.39 ml  Output 500 ml  Net 359.39 ml   Filed Weights   12/23/17 0931 12/23/17 1627  Weight: 81.6 kg 77.4 kg    Examination:  General exam: Appears calm and comfortable  Respiratory  system: No increased work of breathing, scattered wheezing, no crackles or rhonchi Cardiovascular system: Regular rate and rhythm, 3 out of 6 systolic murmur. Gastrointestinal system: Abdomen is nondistended, soft and nontender. No organomegaly or masses felt. Normal bowel sounds heard. Central nervous system: Alert and oriented.  Grossly intact, moving all extremities. Extremities: Trace lower extremity edema, unable to fully AB duct left arm, painful to palpation Skin: well healed IV scars Psychiatry: Judgement and insight appear normal. Mood & affect appropriate.     Data Reviewed: I have personally reviewed following labs and imaging studies  CBC: Recent Labs  Lab 12/23/17 0950 12/24/17 0410 12/25/17 0428  WBC 15.6* 12.9* 12.0*  NEUTROABS 13.5*  --   --   HGB 12.5* 9.9* 8.4*  HCT 38.8* 31.2* 26.1*  MCV 83.4 84.8 85.0  PLT 162 152 176   Basic Metabolic Panel: Recent Labs  Lab 12/23/17 1200 12/23/17 1523 12/24/17 0410 12/25/17 0428  NA 133*  --  137 136  K 2.9*  --  3.6 2.7*  CL 100  --  105 108  CO2 16*  --  21* 20*  GLUCOSE 126*  --  103* 103*  BUN 37*  --  27* 16  CREATININE 2.52*  --  1.42* 1.00  CALCIUM 6.8*  --  8.1* 7.9*  MG  --  2.1  --  1.2*  PHOS  --   --   --  2.4*   GFR: Estimated Creatinine Clearance: 111.9 mL/min (by C-G formula based on SCr of 1 mg/dL). Liver Function Tests: Recent Labs  Lab 12/23/17 1200  AST 101*  ALT 42  ALKPHOS 143*  BILITOT 1.5*  PROT 6.5  ALBUMIN 2.0*   No results for input(s): LIPASE, AMYLASE in the last 168 hours. No results for input(s): AMMONIA in the last 168 hours. Coagulation Profile: No results for input(s): INR, PROTIME in the last 168 hours. Cardiac Enzymes: No results for input(s): CKTOTAL, CKMB, CKMBINDEX, TROPONINI in the last 168 hours. BNP (last 3 results) No results for input(s): PROBNP in the last 8760 hours. HbA1C: No results for input(s): HGBA1C in the last 72 hours. CBG: No results for  input(s): GLUCAP in the last 168 hours. Lipid Profile: No results for input(s): CHOL, HDL, LDLCALC, TRIG, CHOLHDL, LDLDIRECT in the last 72 hours. Thyroid Function Tests: No results for input(s): TSH, T4TOTAL, FREET4, T3FREE, THYROIDAB in the last 72 hours. Anemia Panel: No results for input(s): VITAMINB12, FOLATE, FERRITIN, TIBC, IRON, RETICCTPCT in the last 72 hours. Sepsis Labs: Recent Labs  Lab 12/23/17 1610 12/23/17 1431 12/24/17 1841 12/25/17 0428  LATICACIDVEN 4.09* 1.46 2.5* 1.2    Recent Results (from the past 240 hour(s))  Blood Culture (routine x 2)     Status: Abnormal (Preliminary result)   Collection Time: 12/23/17  9:50 AM  Result Value Ref Range Status   Specimen Description   Final    BLOOD LEFT HAND Performed at [facility], [address], [place], [place] [address]    Special Requests   Final    BOTTLES DRAWN AEROBIC AND ANAEROBIC Blood Culture adequate volume Performed at [facility], [address], [place], [place] [address]    Culture  Setup  Time   Final    GRAM POSITIVE COCCI Gram Stain Report Called to,Read Back By and Verified With: JOHNSON,B. AT 2028 ON 12/23/2017 BY EVA ANAEROBIC BOTTLE ONLY Performed at [facility] CRITICAL RESULT CALLED TO, READ BACK BY AND VERIFIED WITHGean Quint RN [phone] 12/24/17 A BROWNING    Culture (A)  Final    STAPHYLOCOCCUS AUREUS SUSCEPTIBILITIES TO FOLLOW Performed at [facility], [address], [place], [place] [address]    Report Status PENDING  Incomplete  Blood Culture (routine x 2)     Status: Abnormal (Preliminary result)   Collection Time: 12/23/17  9:50 AM  Result Value Ref Range Status   Specimen Description   Final    BLOOD SITE NOT SPECIFIED Performed at [facility], [address], [place], [place] [address]    Special Requests   Final    BOTTLES DRAWN AEROBIC AND ANAEROBIC Blood Culture results may not be optimal due to an inadequate volume of blood received in culture  bottles Performed at [facility], [address], [place], [place] [address]    Culture  Setup Time   Final    GRAM POSITIVE COCCI Gram Stain Report Called to,Read Back By and Verified With: JOHNSON,B. 2129 ON 12/23/2017 BY EVA Performed at [facility] CRITICAL RESULT CALLED TO, READ BACK BY AND VERIFIED WITHGean Quint RN 0160 12/24/17 A BROWNING AEROBIC AND ANAEROBIC BOTTLES Performed at [facility], [address], [place], [place] [address]    Culture STAPHYLOCOCCUS AUREUS (A)  Final   Report Status PENDING  Incomplete  Blood Culture ID Panel (Reflexed)     Status: Abnormal   Collection Time: 12/23/17  9:50 AM  Result Value Ref Range Status   Enterococcus species NOT DETECTED NOT DETECTED Final   Listeria monocytogenes NOT DETECTED NOT DETECTED Final   Staphylococcus species DETECTED (A) NOT DETECTED Final    Comment: CRITICAL RESULT CALLED TO, READ BACK BY AND VERIFIED WITHGean Quint RN [id number] 12/24/17 A BROWNING    Staphylococcus aureus (BCID) DETECTED (A) NOT DETECTED Final    Comment: Methicillin (oxacillin) susceptible Staphylococcus aureus (MSSA). Preferred therapy is anti staphylococcal beta lactam antibiotic (Cefazolin or Nafcillin), unless clinically contraindicated. CRITICAL RESULT CALLED TO, READ BACK BY AND VERIFIED WITHGean Quint RN [phone] 12/24/17 A BROWNING    Methicillin resistance NOT DETECTED NOT DETECTED Final   Streptococcus species NOT DETECTED NOT DETECTED Final   Streptococcus agalactiae NOT DETECTED NOT DETECTED Final   Streptococcus pneumoniae NOT DETECTED NOT DETECTED Final   Streptococcus pyogenes NOT DETECTED NOT DETECTED Final   Acinetobacter baumannii NOT DETECTED NOT DETECTED Final   Enterobacteriaceae species NOT DETECTED NOT DETECTED Final   Enterobacter cloacae complex NOT DETECTED NOT DETECTED Final   Escherichia coli NOT DETECTED NOT DETECTED Final   Klebsiella oxytoca NOT DETECTED NOT DETECTED Final   Klebsiella pneumoniae NOT DETECTED  NOT DETECTED Final   Proteus species NOT DETECTED NOT DETECTED Final   Serratia marcescens NOT DETECTED NOT DETECTED Final   Haemophilus influenzae NOT DETECTED NOT DETECTED Final   Neisseria meningitidis NOT DETECTED NOT DETECTED Final   Pseudomonas aeruginosa NOT DETECTED NOT DETECTED Final   Candida albicans NOT DETECTED NOT DETECTED Final   Candida glabrata NOT DETECTED NOT DETECTED Final   Candida krusei NOT DETECTED NOT DETECTED Final   Candida parapsilosis NOT DETECTED NOT DETECTED Final   Candida tropicalis NOT DETECTED NOT DETECTED Final    Comment: Performed at [facility], [address] [place], [place] [address]  Urine culture     Status: None   Collection Time: 12/23/17  1:00 PM  Result Value Ref Range Status   Specimen Description   Final    URINE, CLEAN CATCH Performed at [facility], [address] [place], [place] [address]    Special Requests   Final    NONE Performed at [facility], [address], [place], [place] [address]    Culture   Final    NO GROWTH Performed at [facility], [address], [place], [place] [address]    Report Status 12/24/2017 FINAL  Final  Culture, blood (routine x 2)     Status: None (Preliminary result)   Collection Time: 12/25/17  8:00 AM  Result Value Ref Range Status   Specimen Description BLOOD RIGHT WRIST  Final   Special Requests   Final    BOTTLES DRAWN AEROBIC ONLY Blood Culture adequate volume Performed at [facility], [address], [place], [place] [address]    Culture PENDING  Incomplete   Report Status PENDING  Incomplete  Culture, blood (routine x 2)     Status: None (Preliminary result)   Collection Time: 12/25/17  8:07 AM  Result Value Ref Range Status   Specimen Description BLOOD LEFT HAND  Final   Special Requests   Final    BOTTLES DRAWN AEROBIC ONLY Blood Culture adequate volume Performed at [facility], [address], [place], [place] [address]    Culture PENDING  Incomplete   Report  Status PENDING  Incomplete         Radiology Studies: Dg Chest 2 View  Result Date: 12/23/2017 CLINICAL DATA:  Fever and body aches. EXAM: CHEST - 2 VIEW COMPARISON:  None. FINDINGS: The heart is normal in size given the AP projection. The mediastinal and hilar contours appear normal. Streaky areas of subsegmental atelectasis are noted. No definite infiltrates or effusions. The bony thorax is intact. IMPRESSION: Low lung volumes with streaky areas of subsegmental atelectasis but no definite infiltrates or effusions. Electronically Signed   By: Rudie Meyer M.D.   On: 12/23/2017 10:35   Mr Thoracic Spine W Wo Contrast  Result Date: 12/23/2017 CLINICAL DATA:  Initial evaluation for progressive back pain, fever, history of IV drug abuse. EXAM: MRI THORACIC AND LUMBAR SPINE WITHOUT AND WITH CONTRAST TECHNIQUE: Multiplanar and multiecho pulse sequences of the thoracic and lumbar spine were obtained without and with intravenous contrast. CONTRAST:  7 cc Gadavist. COMPARISON:  None available. FINDINGS: MRI THORACIC SPINE FINDINGS Alignment: Examination technically limited by you technique in large field-of-view on sagittal sequences. Mild scoliosis. Alignment otherwise normal with preservation of the normal thoracic kyphosis. No listhesis or malalignment. Vertebrae: Vertebral body heights maintained without evidence for acute or chronic fracture. Bone marrow signal intensity diffusely decreased on T1 weighted imaging, most commonly related to anemia, smoking, or obesity. Few small benign hemangiomas noted within the lower thoracic spine. No worrisome osseous lesions. No evidence for osteomyelitis discitis or septic arthritis within the thoracic spine. Cord: Signal intensity within the thoracic spinal cord is normal. No epidural collections. Paraspinal and other soft tissues: Paraspinous soft tissues demonstrate no acute finding. Atelectasis versus consolidation noted within the posterior left lung base.  Visualized lungs are otherwise grossly clear. Visualized visceral structures grossly unremarkable. Disc levels: T1-2:  Unremarkable. T2-3: Unremarkable. T3-4:  Unremarkable. T4-5: Shallow left paracentral disc protrusion mildly indents the left ventral thecal sac. Minimal flattening of  the left ventral cord without significant stenosis. T5-6:  Unremarkable. T6-7: Right paracentral disc protrusion with slight superior migration. Protruding disc contacts the right ventral cord with secondary mild cord flattening. No significant stenosis. T7-8: Shallow right paracentral disc protrusion abuts the right ventral cord. No significant stenosis or cord deformity. T8-9: Shallow left paracentral disc protrusion mildly indents the left ventral thecal sac. Minimal flattening of the left ventral cord without significant stenosis. T9-10: Unremarkable. T10-11: Minimal disc bulge. Mild posterior element hypertrophy. No significant stenosis. T11-12: Mild posterior element hypertrophy. No significant stenosis. T12-L1:  Unremarkable. MRI LUMBAR SPINE FINDINGS Segmentation: Normal segmentation. Lowest well-formed disc labeled the L5-S1 level. Alignment: Straightening of the normal lumbar lordosis with trace dextroscoliosis. No listhesis or malalignment. Vertebrae: Abnormal edema and enhancement seen within the L3 and L4 vertebral bodies centered about the L3-4 disc space. Abnormal edema with loss of normal disc height seen within the L3-4 disc itself. Findings consistent with acute osteomyelitis discitis. Mild diffuse enhancement seen within the ventral epidural space, extending into the bilateral L3-4 neural foramina without frank epidural abscess or collection. Associated paraspinous edema and enhancement within the adjacent psoas musculature bilaterally. Superimposed paraspinous abscess within the right psoas muscle measures 1.3 x 1.0 x 2.7 cm (AP by transverse by craniocaudad, series 7, image 25). No other discrete soft tissue  collections. Mild edema and enhancement noted about the bilateral L3-4 facets as well. No other evidence for acute infection within the lumbar spine. Vertebral body height maintained at this time without evidence for acute or chronic fracture. Underlying bone marrow signal intensity diffusely decreased on T1 weighted imaging. Small benign hemangioma noted within the T12 vertebral body. No worrisome osseous lesions. Conus medullaris: Extends to the L1 level and appears normal. Paraspinal and other soft tissues: Paraspinous edema and enhancement adjacent to the L3-4 interspace with superimposed right psoas muscle abscess as above. Soft tissue edema within the posterior paraspinous musculature extending from L3-4 inferiorly could be reactive in nature or reflect associated myositis (series 2, images 6, 12). No discrete collections within this region. Retroperitoneal lymph nodes measure up to 16 mm in short axis (series 4, image 15), indeterminate, but could be reactive. Visualized visceral structures grossly unremarkable. Disc levels: L1-2:  Unremarkable. L2-3:  Unremarkable. L3-4: Findings consistent with acute osteomyelitis discitis. Associated loss of L3-4 disc height with diffuse disc bulging. Enhancement within the ventral epidural space extending into the bilateral L3-4 neural foramina without frank epidural abscess. Resultant mild spinal stenosis. Mild to moderate bilateral L3 neural foraminal narrowing. L4-5:  Unremarkable. L5-S1:  Unremarkable. IMPRESSION: 1. Findings consistent with acute osteomyelitis discitis involving the L3-4 level. Associated enhancement involving the adjacent ventral epidural space without discrete epidural abscess or collection. Mild spinal stenosis at this level. 2. Paraspinous phlegmon and edema adjacent to the L3-4 level with superimposed 1.3 x 1.0 x 2.7 cm right psoas muscle abscess as above. 3. No other evidence for acute infection within the thoracolumbar spine. 4. Multifocal  small disc protrusions involving the mid-thoracic spine as above without significant stenosis. Electronically Signed   By: Rise Mu M.D.   On: 12/23/2017 13:47   Mr Lumbar Spine W Wo Contrast  Result Date: 12/23/2017 CLINICAL DATA:  Initial evaluation for progressive back pain, fever, history of IV drug abuse. EXAM: MRI THORACIC AND LUMBAR SPINE WITHOUT AND WITH CONTRAST TECHNIQUE: Multiplanar and multiecho pulse sequences of the thoracic and lumbar spine were obtained without and with intravenous contrast. CONTRAST:  7 cc Gadavist. COMPARISON:  None available. FINDINGS:  MRI THORACIC SPINE FINDINGS Alignment: Examination technically limited by you technique in large field-of-view on sagittal sequences. Mild scoliosis. Alignment otherwise normal with preservation of the normal thoracic kyphosis. No listhesis or malalignment. Vertebrae: Vertebral body heights maintained without evidence for acute or chronic fracture. Bone marrow signal intensity diffusely decreased on T1 weighted imaging, most commonly related to anemia, smoking, or obesity. Few small benign hemangiomas noted within the lower thoracic spine. No worrisome osseous lesions. No evidence for osteomyelitis discitis or septic arthritis within the thoracic spine. Cord: Signal intensity within the thoracic spinal cord is normal. No epidural collections. Paraspinal and other soft tissues: Paraspinous soft tissues demonstrate no acute finding. Atelectasis versus consolidation noted within the posterior left lung base. Visualized lungs are otherwise grossly clear. Visualized visceral structures grossly unremarkable. Disc levels: T1-2:  Unremarkable. T2-3: Unremarkable. T3-4:  Unremarkable. T4-5: Shallow left paracentral disc protrusion mildly indents the left ventral thecal sac. Minimal flattening of the left ventral cord without significant stenosis. T5-6:  Unremarkable. T6-7: Right paracentral disc protrusion with slight superior migration.  Protruding disc contacts the right ventral cord with secondary mild cord flattening. No significant stenosis. T7-8: Shallow right paracentral disc protrusion abuts the right ventral cord. No significant stenosis or cord deformity. T8-9: Shallow left paracentral disc protrusion mildly indents the left ventral thecal sac. Minimal flattening of the left ventral cord without significant stenosis. T9-10: Unremarkable. T10-11: Minimal disc bulge. Mild posterior element hypertrophy. No significant stenosis. T11-12: Mild posterior element hypertrophy. No significant stenosis. T12-L1:  Unremarkable. MRI LUMBAR SPINE FINDINGS Segmentation: Normal segmentation. Lowest well-formed disc labeled the L5-S1 level. Alignment: Straightening of the normal lumbar lordosis with trace dextroscoliosis. No listhesis or malalignment. Vertebrae: Abnormal edema and enhancement seen within the L3 and L4 vertebral bodies centered about the L3-4 disc space. Abnormal edema with loss of normal disc height seen within the L3-4 disc itself. Findings consistent with acute osteomyelitis discitis. Mild diffuse enhancement seen within the ventral epidural space, extending into the bilateral L3-4 neural foramina without frank epidural abscess or collection. Associated paraspinous edema and enhancement within the adjacent psoas musculature bilaterally. Superimposed paraspinous abscess within the right psoas muscle measures 1.3 x 1.0 x 2.7 cm (AP by transverse by craniocaudad, series 7, image 25). No other discrete soft tissue collections. Mild edema and enhancement noted about the bilateral L3-4 facets as well. No other evidence for acute infection within the lumbar spine. Vertebral body height maintained at this time without evidence for acute or chronic fracture. Underlying bone marrow signal intensity diffusely decreased on T1 weighted imaging. Small benign hemangioma noted within the T12 vertebral body. No worrisome osseous lesions. Conus medullaris:  Extends to the L1 level and appears normal. Paraspinal and other soft tissues: Paraspinous edema and enhancement adjacent to the L3-4 interspace with superimposed right psoas muscle abscess as above. Soft tissue edema within the posterior paraspinous musculature extending from L3-4 inferiorly could be reactive in nature or reflect associated myositis (series 2, images 6, 12). No discrete collections within this region. Retroperitoneal lymph nodes measure up to 16 mm in short axis (series 4, image 15), indeterminate, but could be reactive. Visualized visceral structures grossly unremarkable. Disc levels: L1-2:  Unremarkable. L2-3:  Unremarkable. L3-4: Findings consistent with acute osteomyelitis discitis. Associated loss of L3-4 disc height with diffuse disc bulging. Enhancement within the ventral epidural space extending into the bilateral L3-4 neural foramina without frank epidural abscess. Resultant mild spinal stenosis. Mild to moderate bilateral L3 neural foraminal narrowing. L4-5:  Unremarkable. L5-S1:  Unremarkable.  IMPRESSION: 1. Findings consistent with acute osteomyelitis discitis involving the L3-4 level. Associated enhancement involving the adjacent ventral epidural space without discrete epidural abscess or collection. Mild spinal stenosis at this level. 2. Paraspinous phlegmon and edema adjacent to the L3-4 level with superimposed 1.3 x 1.0 x 2.7 cm right psoas muscle abscess as above. 3. No other evidence for acute infection within the thoracolumbar spine. 4. Multifocal small disc protrusions involving the mid-thoracic spine as above without significant stenosis. Electronically Signed   By: Rise Mu M.D.   On: 12/23/2017 13:47   Dg Shoulder Left  Result Date: 12/24/2017 CLINICAL DATA:  Acute left shoulder pain without known injury. EXAM: LEFT SHOULDER - 2+ VIEW COMPARISON:  None. FINDINGS: There is no evidence of fracture or dislocation. There is no evidence of arthropathy or other  focal bone abnormality. Soft tissues are unremarkable. IMPRESSION: Negative. Electronically Signed   By: Lupita Raider, M.D.   On: 12/24/2017 13:16        Scheduled Meds: . heparin injection (subcutaneous)  5,000 Units Subcutaneous Q8H  . nicotine  21 mg Transdermal Daily  . potassium chloride  40 mEq Oral BID  . saccharomyces boulardii  250 mg Oral BID  . sodium chloride flush  3 mL Intravenous Q12H   Continuous Infusions: . magnesium sulfate 1 - 4 g bolus IVPB 4 g (12/25/17 0817)  . potassium chloride 10 mEq (12/25/17 0912)  . vancomycin Stopped (12/25/17 0156)     LOS: 2 days    Time spent: 35    Delaine Lame, MD [facility]  If 7PM-7AM, please contact night-coverage [email or web address] Password [facility] 12/25/2017, 9:47 AM

## 2017-12-25 NOTE — Progress Notes (Signed)
CRITICAL VALUE ALERT  Critical Value:  Potassium 2.7  Date & Time Notied:  12/25/17 1610  Provider Notified: Robb Matar  Orders Received/Actions taken: 3 Potassium runs, Potassium chloride tab 40 mEq

## 2017-12-26 ENCOUNTER — Inpatient Hospital Stay (HOSPITAL_COMMUNITY): Payer: Medicaid - Out of State

## 2017-12-26 ENCOUNTER — Encounter (HOSPITAL_COMMUNITY): Payer: Self-pay

## 2017-12-26 DIAGNOSIS — M60112 Interstitial myositis, left shoulder: Secondary | ICD-10-CM

## 2017-12-26 DIAGNOSIS — M25512 Pain in left shoulder: Secondary | ICD-10-CM

## 2017-12-26 LAB — CULTURE, BLOOD (ROUTINE X 2): Special Requests: ADEQUATE

## 2017-12-26 LAB — COMPREHENSIVE METABOLIC PANEL
ALT: 23 U/L (ref 0–44)
Albumin: 1.6 g/dL — ABNORMAL LOW (ref 3.5–5.0)
Alkaline Phosphatase: 158 U/L — ABNORMAL HIGH (ref 38–126)
Anion gap: 8 (ref 5–15)
Calcium: 7.7 mg/dL — ABNORMAL LOW (ref 8.9–10.3)
GFR calc Af Amer: >60 mL/min (ref >60)
GFR calc non Af Amer: >60 mL/min (ref >60)
Glucose, Bld: 116 mg/dL — ABNORMAL HIGH (ref 70–99)
Potassium: 3.3 mmol/L — ABNORMAL LOW (ref 3.5–5.1)
Total Bilirubin: 1.1 mg/dL (ref 0.3–1.2)
Total Protein: 6.4 g/dL — ABNORMAL LOW (ref 6.5–8.1)

## 2017-12-26 LAB — CBC
HCT: 26.7 % — ABNORMAL LOW (ref 39.0–52.0)
Hemoglobin: 8.5 g/dL — ABNORMAL LOW (ref 13.0–17.0)
MCH: 27.9 pg (ref 26.0–34.0)
MCHC: 31.8 g/dL (ref 30.0–36.0)
MCV: 87.5 fL (ref 80.0–100.0)
Platelets: 225 10*3/uL (ref 150–400)
RBC: 3.05 MIL/uL — ABNORMAL LOW (ref 4.22–5.81)
RDW: 13.3 % (ref 11.5–15.5)
WBC: 10.9 10*3/uL — ABNORMAL HIGH (ref 4.0–10.5)
nRBC: 0 % (ref 0.0–0.2)

## 2017-12-26 LAB — TROPONIN I: Troponin I: 0.04 ng/mL (ref <0.03)

## 2017-12-26 LAB — COMPREHENSIVE METABOLIC PANEL WITH GFR
AST: 41 U/L (ref 15–41)
BUN: 16 mg/dL (ref 6–20)
CO2: 16 mmol/L — ABNORMAL LOW (ref 22–32)
Chloride: 110 mmol/L (ref 98–111)
Creatinine, Ser: 1.06 mg/dL (ref 0.61–1.24)
Sodium: 134 mmol/L — ABNORMAL LOW (ref 135–145)

## 2017-12-26 LAB — MAGNESIUM: Magnesium: 2.2 mg/dL (ref 1.7–2.4)

## 2017-12-26 MED ORDER — NYSTATIN 100000 UNIT/ML MT SUSP
5.0000 mL | Freq: Four times a day (QID) | OROMUCOSAL | Status: DC
  Administered 2017-12-26 – 2018-01-09 (×43): 500000 [IU] via ORAL
  Filled 2017-12-26 (×48): qty 5

## 2017-12-26 MED ORDER — SODIUM CHLORIDE 0.9 % IV SOLN
INTRAVENOUS | Status: DC | PRN
  Administered 2017-12-26 (×2): 250 mL via INTRAVENOUS
  Administered 2017-12-28: 500 mL via INTRAVENOUS
  Administered 2017-12-30: 250 mL via INTRAVENOUS
  Administered 2017-12-31 – 2018-01-12 (×7): 500 mL via INTRAVENOUS
  Administered 2018-01-18: 250 mL via INTRAVENOUS
  Administered 2018-01-20: 500 mL via INTRAVENOUS

## 2017-12-26 MED ORDER — IOPAMIDOL (ISOVUE-300) INJECTION 61%
100.0000 mL | Freq: Once | INTRAVENOUS | Status: AC | PRN
  Administered 2017-12-26: 100 mL via INTRAVENOUS

## 2017-12-26 NOTE — Progress Notes (Signed)
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  Tricuspid valve vegetation noted on echo on 12/24/2017.  Unfortunately the patient is not a candidate for home antibiotics due to his ongoing drug use. -Discontinue IV vancomycin 12/23/2017 to 12/25/2017 - Continue IV cefazolin started 12/25/2017 -Blood cultures 12/23/2017 growing out MSSA - Blood cultures from 12/25/2017 no growth to date -ID consult, recommendations pending -Cardiology consult  #) Abdominal pain/diarrhea: Patient reports that for the last 2 or 3 days he has had abdominal pain and diarrhea.  This is particularly worse while eating.  He has not had any emesis.  His abdominal exam is fairly benign though he does have fairly significant suprapubic tenderness. -CT chest abdomen pelvis pending with contrast  #) Left shoulder pain/weakness: Due to myositis and septic bursitis noted on MRI on 12/25/2017.  #) L3/4 osteomyelitis, paraspinous phlegmon, right psoas muscle abscess: All likely secondary to endocarditis.  The abscess is not significant sufficiently large for drainage at this time. -Antibodies per above -Orthopedic surgery, Dr. Romeo Apple ending recommendations the likely no role for intervention at this time  #) IV drug use:  -Counseling provided -Patient does not want any medication for  withdrawal  #) Chronic hepatitis C: -Outpatient treatment  Fluids: Tolerating p.o. Electrodes: Monitor and supplement Nutrition: Regular diet  Prophylaxis: Subcu heparin  Disposition: Unclear at this time however will continue IV antibiotics, will likely need to complete inpatient course  Full code   Consultants:   Infectious disease  Cardiology  Orthopedic surgery  Procedures:  Echo 12/24/2017:- Left ventricle: The cavity size was normal. Wall thickness was   normal. Systolic function was normal. The estimated ejection   fraction was in the range of 55% to 60%. Wall motion was normal;   there were no regional wall motion abnormalities. - Mitral valve: There was trivial regurgitation. - Right atrium: Central venous pressure (est): 3 mm Hg. - Atrial septum: No defect or patent foramen ovale was identified. - Tricuspid valve: Large, homogeneous echodensity associated with   the atrial side of the septal tricuspid leaflet consistent with   vegetation in the setting of documented bacteremia. There was   moderate regurgitation. - Pulmonary arteries: PA peak pressure: 28 mm Hg (S). - Pericardium, extracardiac: A moderate pericardial effusion was   identified posterior to the heart.    Antimicrobials:   IV vancomycin started 12/23/2017 to 12/25/2017  IV aztreonam 12/23/2017 to 12/24/2017  IV cefazolin started 12/25/2017 to ongoing   Subjective: This morning patient reports continued severe back pain.  His shoulder is bothering him less than yesterday.  He does report some abdominal pain that is primarily suprapubic in nature as well as some diarrhea.  His pain is particularly worse while eating.  He has not had any nausea or vomiting.  He denies any cough, congestion.  She did have an episode of  chest pain last night.  Objective: Vitals:   12/25/17 1353 12/25/17 1542 12/25/17 2112 12/26/17 0543  BP: 127/75  109/68 108/75  Pulse: 94  85 86  Resp: 20     Temp: (!) 100.9  F (38.3 C) (!) 101.3 F (38.5 C) 98.8 F (37.1 C) 98.8 F (37.1 C)  TempSrc: Oral Oral Oral Oral  SpO2: 100%  100% 96%  Weight:      Height:        Intake/Output Summary (Last 24 hours) at 12/26/2017 0923 Last data filed at 12/26/2017 0400 Gross per 24 hour  Intake 440 ml  Output -  Net 440 ml   Filed Weights   12/23/17 0931 12/23/17 1627  Weight: 81.6 kg 77.4 kg    Examination:  General exam: Appears calm and comfortable  Respiratory system: No increased work of breathing, no wheezing, no crackles or rhonchi Cardiovascular system: Regular rate and rhythm, 3 out of 6 systolic murmur. Gastrointestinal system: Abdomen is nondistended, soft, mildly tender to suprapubic palpation, no rebound or guarding, diminished bowel sounds d. Central nervous system: Alert and oriented.  Grossly intact, moving all extremities. Extremities: Trace lower extremity edema, unable to fully AB duct left arm, painful to palpation Skin: well healed IV scars Psychiatry: Judgement and insight appear normal. Mood & affect appropriate.     Data Reviewed: I have personally reviewed following labs and imaging studies  CBC: Recent Labs  Lab 12/23/17 0950 12/24/17 0410 12/25/17 0428 12/26/17 0501  WBC 15.6* 12.9* 12.0* 10.9*  NEUTROABS 13.5*  --   --   --   HGB 12.5* 9.9* 8.4* 8.5*  HCT 38.8* 31.2* 26.1* 26.7*  MCV 83.4 84.8 85.0 87.5  PLT 162 152 176 225   Basic Metabolic Panel: Recent Labs  Lab 12/23/17 1200 12/23/17 1523 12/24/17 0410 12/25/17 0428 12/26/17 0501  NA 133*  --  137 136 134*  K 2.9*  --  3.6 2.7* 3.3*  CL 100  --  105 108 110  CO2 16*  --  21* 20* 16*  GLUCOSE 126*  --  103* 103* 116*  BUN 37*  --  27* 16 16  CREATININE 2.52*  --  1.42* 1.00 1.06  CALCIUM 6.8*  --  8.1* 7.9* 7.7*  MG  --  2.1  --  1.2* 2.2  PHOS  --   --   --  2.4*  --    GFR: Estimated Creatinine Clearance: 105.6 mL/min (by C-G formula based on SCr of 1.06 mg/dL). Liver Function Tests: Recent  Labs  Lab 12/23/17 1200 12/26/17 0501  AST 101* 41  ALT 42 23  ALKPHOS 143* 158*  BILITOT 1.5* 1.1  PROT 6.5 6.4*  ALBUMIN 2.0* 1.6*   No results for input(s): LIPASE, AMYLASE in the last 168 hours. No results for input(s): AMMONIA in the last 168 hours. Coagulation Profile: No results for input(s): INR, PROTIME in the last 168 hours. Cardiac Enzymes: Recent Labs  Lab 12/26/17 0822  TROPONINI 0.04*   BNP (last 3 results) No results for input(s): PROBNP in the last 8760 hours. HbA1C: No results for input(s): HGBA1C in the last 72 hours. CBG: No results for input(s): GLUCAP in the last 168 hours. Lipid Profile: No results for input(s): CHOL, HDL, LDLCALC, TRIG, CHOLHDL, LDLDIRECT in the last 72 hours. Thyroid Function Tests: No results for input(s): TSH, T4TOTAL, FREET4, T3FREE, THYROIDAB in the last 72 hours. Anemia Panel: No results for input(s): VITAMINB12, FOLATE, FERRITIN, TIBC, IRON, RETICCTPCT in the  last 72 hours. Sepsis Labs: Recent Labs  Lab 12/23/17 0981 12/23/17 1431 12/24/17 1841 12/25/17 0428  LATICACIDVEN 4.09* 1.46 2.5* 1.2    Recent Results (from the past 240 hour(s))  Blood Culture (routine x 2)     Status: Abnormal   Collection Time: 12/23/17  9:50 AM  Result Value Ref Range Status   Specimen Description   Final    BLOOD LEFT HAND Performed at [facility], [address] [place], [place] [address]    Special Requests   Final    BOTTLES DRAWN AEROBIC AND ANAEROBIC Blood Culture adequate volume Performed at [facility], [address], [place], [place] [address]    Culture  Setup Time   Final    GRAM POSITIVE COCCI Gram Stain Report Called to,Read Back By and Verified With: JOHNSON,B. AT 2028 ON 12/23/2017 BY EVA ANAEROBIC BOTTLE ONLY Performed at [facility] CRITICAL RESULT CALLED TO, READ BACK BY AND VERIFIED WITHGean Quint RN 2130 12/24/17 A BROWNING Performed at [facility], [address] [place], [place] [address]     Culture STAPHYLOCOCCUS AUREUS (A)  Final   Report Status 12/26/2017 FINAL  Final   Organism ID, Bacteria STAPHYLOCOCCUS AUREUS  Final      Susceptibility   Staphylococcus aureus - MIC*    CIPROFLOXACIN <=0.5 SENSITIVE Sensitive     ERYTHROMYCIN >=8 RESISTANT Resistant     GENTAMICIN <=0.5 SENSITIVE Sensitive     OXACILLIN 0.5 SENSITIVE Sensitive     TETRACYCLINE <=1 SENSITIVE Sensitive     VANCOMYCIN 1 SENSITIVE Sensitive     TRIMETH/SULFA <=10 SENSITIVE Sensitive     CLINDAMYCIN <=0.25 SENSITIVE Sensitive     RIFAMPIN <=0.5 SENSITIVE Sensitive     Inducible Clindamycin NEGATIVE Sensitive     * STAPHYLOCOCCUS AUREUS  Blood Culture (routine x 2)     Status: Abnormal   Collection Time: 12/23/17  9:50 AM  Result Value Ref Range Status   Specimen Description   Final    BLOOD SITE NOT SPECIFIED Performed at [facility], [address], [place], [place] [address]    Special Requests   Final    BOTTLES DRAWN AEROBIC AND ANAEROBIC Blood Culture results may not be optimal due to an inadequate volume of blood received in culture bottles Performed at [facility], [address] [place], [place] [address]    Culture  Setup Time   Final    GRAM POSITIVE COCCI Gram Stain Report Called to,Read Back By and Verified With: JOHNSON,B. 2129 ON 12/23/2017 BY EVA Performed at [facility] CRITICAL RESULT CALLED TO, READ BACK BY AND VERIFIED WITHGean Quint RN 1324 12/24/17 A BROWNING AEROBIC AND ANAEROBIC BOTTLES Performed at [facility], [address], [place], [place] [address]    Culture (A)  Final    STAPHYLOCOCCUS AUREUS SUSCEPTIBILITIES PERFORMED ON PREVIOUS CULTURE WITHIN THE LAST 5 DAYS. Performed at [facility], [address], [place], [place] [address]    Report Status 12/26/2017 FINAL  Final  Blood Culture ID Panel (Reflexed)     Status: Abnormal   Collection Time: 12/23/17  9:50 AM  Result Value Ref Range Status   Enterococcus species NOT DETECTED NOT DETECTED  Final   Listeria monocytogenes NOT DETECTED NOT DETECTED Final   Staphylococcus species DETECTED (A) NOT DETECTED Final    Comment: CRITICAL RESULT CALLED TO, READ BACK BY AND VERIFIED WITHGean Quint RN [id number] 12/24/17 A BROWNING    Staphylococcus  aureus (BCID) DETECTED (A) NOT DETECTED Final    Comment: Methicillin (oxacillin) susceptible Staphylococcus aureus (MSSA). Preferred therapy is anti staphylococcal beta lactam antibiotic (Cefazolin or Nafcillin), unless clinically contraindicated. CRITICAL RESULT CALLED TO, READ BACK BY AND VERIFIED WITHGean Quint RN [phone] 12/24/17 A BROWNING    Methicillin resistance NOT DETECTED NOT DETECTED Final   Streptococcus species NOT DETECTED NOT DETECTED Final   Streptococcus agalactiae NOT DETECTED NOT DETECTED Final   Streptococcus pneumoniae NOT DETECTED NOT DETECTED Final   Streptococcus pyogenes NOT DETECTED NOT DETECTED Final   Acinetobacter baumannii NOT DETECTED NOT DETECTED Final   Enterobacteriaceae species NOT DETECTED NOT DETECTED Final   Enterobacter cloacae complex NOT DETECTED NOT DETECTED Final   Escherichia coli NOT DETECTED NOT DETECTED Final   Klebsiella oxytoca NOT DETECTED NOT DETECTED Final   Klebsiella pneumoniae NOT DETECTED NOT DETECTED Final   Proteus species NOT DETECTED NOT DETECTED Final   Serratia marcescens NOT DETECTED NOT DETECTED Final   Haemophilus influenzae NOT DETECTED NOT DETECTED Final   Neisseria meningitidis NOT DETECTED NOT DETECTED Final   Pseudomonas aeruginosa NOT DETECTED NOT DETECTED Final   Candida albicans NOT DETECTED NOT DETECTED Final   Candida glabrata NOT DETECTED NOT DETECTED Final   Candida krusei NOT DETECTED NOT DETECTED Final   Candida parapsilosis NOT DETECTED NOT DETECTED Final   Candida tropicalis NOT DETECTED NOT DETECTED Final    Comment: Performed at [facility], [address] [place], [place] [address]  Urine culture     Status: None   Collection Time: 12/23/17  1:00 PM   Result Value Ref Range Status   Specimen Description   Final    URINE, CLEAN CATCH Performed at [facility], [address], [place], [place] [address]    Special Requests   Final    NONE Performed at [facility], [address], [place], [place] [address]    Culture   Final    NO GROWTH Performed at [facility], [address], [place], [place] [address]    Report Status 12/24/2017 FINAL  Final  Culture, blood (routine x 2)     Status: None (Preliminary result)   Collection Time: 12/25/17  8:00 AM  Result Value Ref Range Status   Specimen Description BLOOD RIGHT WRIST  Final   Special Requests   Final    BOTTLES DRAWN AEROBIC ONLY Blood Culture adequate volume   Culture   Final    NO GROWTH < 24 HOURS Performed at [facility], [address], [place], [place] [address]    Report Status PENDING  Incomplete  Culture, blood (routine x 2)     Status: None (Preliminary result)   Collection Time: 12/25/17  8:07 AM  Result Value Ref Range Status   Specimen Description BLOOD LEFT HAND  Final   Special Requests   Final    BOTTLES DRAWN AEROBIC ONLY Blood Culture adequate volume   Culture   Final    NO GROWTH < 24 HOURS Performed at [facility], [address], [place], [place] [address]    Report Status PENDING  Incomplete         Radiology Studies: Mr Shoulder Left Wo Contrast  Addendum Date: 12/25/2017   ADDENDUM REPORT: 12/25/2017 13:51 ADDENDUM: I failed to mention that there are numerous rounded pulmonary lesions noted which are highly suspicious for septic emboli. Recommend chest CT with contrast for further evaluation. These results will be called to the ordering clinician or  representative by the Radiologist Assistant, and communication documented in the PACS or zVision Dashboard. Electronically Signed   By: Rudie Meyer M.D.   On: 12/25/2017 13:51   Result Date: 12/25/2017 CLINICAL DATA:  Left shoulder pain. 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  EXAM: MRI OF THE LEFT SHOULDER WITHOUT CONTRAST TECHNIQUE: Multiplanar, multisequence MR imaging of the shoulder was performed. No intravenous contrast was administered. COMPARISON:  Radiographs 12/24/2017 FINDINGS: Rotator cuff: Mild tendinopathy/tendinosis. No partial or full-thickness rotator cuff tear. Muscles: There is moderate edema like signal abnormality in the infraspinatus and teres minor muscles suggesting myositis. If there is a history of trauma this could be a muscle tear or muscle strain. I do not see any findings suspicious for pyomyositis. Biceps long head:  Intact. Acromioclavicular Joint: No significant degenerative changes type 1 acromion. No lateral downsloping or undersurface spurring. Glenohumeral Joint: No joint effusion. Mild synovitis versus adhesive capsulitis. Labrum: Degenerated and possibly torn superior labrum. The anterior and posterior labrum appear normal. Bones: Very heterogeneous marrow signal in the metadiaphyseal region of the humerus. This is probably related to smoking or anemia. It does not have the MR appearance of osteomyelitis. There is no cortical edema or periosteal reaction or pericortical fluid. Similar appearance of the scapula and clavicle. Other: Moderate fluid in the subacromial/subdeltoid bursa. This could be simple bursitis but could not exclude septic bursitis given the patient's situation. IMPRESSION: 1. Edema like signal abnormality in the infraspinatus and teres minor muscles could suggest a muscle strain/partial tear if there is a history of trauma. Otherwise, this could be myositis. No definite findings for pyomyositis. 2. Moderate subacromial/subdeltoid fluid could be simple bursitis or septic bursitis given the patient's clinical situation. 3. Patchy marrow signal abnormality in the humerus but no definite MR findings to suggest osteomyelitis. 4. Mild rotator cuff tendinopathy/tendinosis.  No tear. Electronically Signed: By: Rudie Meyer M.D. On:  12/25/2017 12:33   Dg Shoulder Left  Result Date: 12/24/2017 CLINICAL DATA:  Acute left shoulder pain without known injury. EXAM: LEFT SHOULDER - 2+ VIEW COMPARISON:  None. FINDINGS: There is no evidence of fracture or dislocation. There is no evidence of arthropathy or other focal bone abnormality. Soft tissues are unremarkable. IMPRESSION: Negative. Electronically Signed   By: Lupita Raider, M.D.   On: 12/24/2017 13:16        Scheduled Meds: . heparin injection (subcutaneous)  5,000 Units Subcutaneous Q8H  . nicotine  21 mg Transdermal Daily  . saccharomyces boulardii  250 mg Oral BID  . sodium chloride flush  3 mL Intravenous Q12H   Continuous Infusions: .  ceFAZolin (ANCEF) IV 2 g (12/26/17 0629)     LOS: 3 days    Time spent: 35    Delaine Lame, MD [facility]  If 7PM-7AM, please contact night-coverage [email or web address] Password [facility] 12/26/2017, 9:23 AM

## 2017-12-26 NOTE — Progress Notes (Signed)
CRITICAL VALUE ALERT  Critical Value:  Troponin 0.04  Date & Time Notied:  12/26/2017, 1191  Provider Notified: Dr. Clearnce Sorrel  Orders Received/Actions taken: EKG stat ordered.

## 2017-12-26 NOTE — Consult Note (Signed)
Patient ID: Howard Diaz, male   DOB: [birth year], 32 y.o.   MRN: [id number]  Detailed history Detailed exam Low complexity  Requested by Michel Santee, MD  Reason for consultation SHOULDER PAIN LEFT   Chief Complaint  Patient presents with  . Fever    HPI Howard Diaz is a 32 y.o. male.  This patient presented with a septic picture with multiple foci of septic emboli.  MRI was obtained for left shoulder pain and lack of motion.  The MRI showed myositis subdeltoid and subacromial bursitis with normal acromioclavicular joint.  Consult obtained to assess need for drainage of the shoulder.  Pain located over the left shoulder Duration since the time of admission approximately 2 days Severe Dull Worse with attempts at abduction associated with decreased range of motion   Review of Systems (at least 2) ROS  1.  Fever 2.  Back pain 3.  Malaise 4.  Radiating pain down left arm although improved today   Past Medical History:  Diagnosis Date  . Alcohol abuse   . Back pain   . Cirrhosis of liver (HCC)   . Hepatitis C   . IV drug abuse (HCC)   . Jaundice    History reviewed. No pertinent surgical history.   Social History  Social History   Tobacco Use  . Smoking status: Current Every Day Smoker    Packs/day: 1.00  . Smokeless tobacco: Never Used  Substance Use Topics  . Alcohol use: Not Currently    Frequency: Never    Comment: former heavy drinker  . Drug use: Not on file    Comment: history of opiod abuse- months ago    Allergies  Allergen Reactions  . Penicillins Anaphylaxis    Has patient had a PCN reaction causing immediate rash, facial/tongue/throat swelling, SOB or lightheadedness with hypotension: Yes Has patient had a PCN reaction causing severe rash involving mucus membranes or skin necrosis: Yes Has patient had a PCN reaction that required hospitalization:Yes Has patient had a PCN reaction occurring within the last 10 years: No If all of the above  answers are "NO", then may proceed with Cephalosporin use.     Current Facility-Administered Medications  Medication Dose Route Frequency Provider Last Rate Last Dose  . acetaminophen (TYLENOL) tablet 650 mg  650 mg Oral Q8H PRN Vassie Loll, MD   650 mg at 12/25/17 1541   Or  . acetaminophen (TYLENOL) suppository 650 mg  650 mg Rectal Q8H PRN Vassie Loll, MD      . ceFAZolin (ANCEF) IVPB 2g/100 mL premix  2 g Intravenous Q8H Purohit, Shrey C, MD 200 mL/hr at 12/26/17 0629 2 g at 12/26/17 0629  . heparin injection 5,000 Units  5,000 Units Subcutaneous Q8H Vassie Loll, MD   5,000 Units at 12/26/17 [id number]  . loperamide (IMODIUM) capsule 2 mg  2 mg Oral PRN Sherryll Burger, Pratik D, DO   2 mg at 12/25/17 2107  . morphine 2 MG/ML injection 2 mg  2 mg Intravenous Q4H PRN Vassie Loll, MD   2 mg at 12/26/17 1191  . nicotine (NICODERM CQ - dosed in mg/24 hours) patch 21 mg  21 mg Transdermal Daily Vassie Loll, MD      . ondansetron [facility]) tablet 4 mg  4 mg Oral Q6H PRN Vassie Loll, MD       Or  . ondansetron [facility]) injection 4 mg  4 mg Intravenous Q6H PRN Vassie Loll, MD      . oxyCODONE (Oxy IR/ROXICODONE) immediate  release tablet 5 mg  5 mg Oral Q4H PRN Vassie Loll, MD   5 mg at 12/26/17 0407  . saccharomyces boulardii (FLORASTOR) capsule 250 mg  250 mg Oral BID Vassie Loll, MD   250 mg at 12/25/17 2107  . sodium chloride flush (NS) 0.9 % injection 3 mL  3 mL Intravenous Q12H Vassie Loll, MD   3 mL at 12/25/17 2108  . zolpidem (AMBIEN) tablet 5 mg  5 mg Oral QHS PRN Maurilio Lovely D, DO   5 mg at 12/25/17 2107      Physical Exam-Detailed Physical Exam  Blood pressure 108/75, pulse 86, temperature 98.8 F (37.1 C), temperature source Oral, resp. rate 20, height 5\' 11"  (1.803 m), weight 77.4 kg, SpO2 96 %. Gen. appearance no gross deformities however the patient appears very somnolent weak with malaise Cardiovascular exam the pulses are 2+ with no peripheral edema  The  ambulatory status is unaffected although he is having back pain which is preventing his ambulation  Extremity examined left upper extremity He can flex and extend his elbow he can abduct his arm 45 degrees and flex at 45 degrees He is tender around the anterior and posterior portions of the shoulder Stability was deferred by testing but his shoulder MRI shows no instability I think his weakness in abduction and flexion is more secondary to pain from the myositis he has no tremor or atrophy Skin shows no lesions  Right upper extremity Inspection Range of motion assessment full range of motion as recorded Stability assessment stability test reveal no instability or laxity Muscle strength and muscle tone are normal with no atrophy or tremors Skin there are no scars rashes lesions or lacerations  Sensation to touch is normal The patient is oriented to person place and time The patient's mood and affect show somnolence without anxiety agitation or depression    MEDICAL DECISION MAKING (minimum/low)  Data Reviewed  CLINICAL DATA:  Acute left shoulder pain without known injury.   EXAM: LEFT SHOULDER - 2+ VIEW   COMPARISON:  None.   FINDINGS: There is no evidence of fracture or dislocation. There is no evidence of arthropathy or other focal bone abnormality. Soft tissues are unremarkable.   IMPRESSION: Negative.     Electronically Signed   By: Lupita Raider, M.D.   On: 12/24/2017 13:16   CLINICAL DATA:  Left shoulder pain. 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   EXAM: MRI OF THE LEFT SHOULDER WITHOUT CONTRAST   TECHNIQUE: Multiplanar, multisequence MR imaging of the shoulder was performed. No intravenous contrast was administered.   COMPARISON:  Radiographs 12/24/2017   FINDINGS: Rotator cuff: Mild tendinopathy/tendinosis. No partial or full-thickness rotator cuff tear.   Muscles: There is moderate edema like signal abnormality in the infraspinatus  and teres minor muscles suggesting myositis. If there is a history of trauma this could be a muscle tear or muscle strain. I do not see any findings suspicious for pyomyositis.   Biceps long head:  Intact.   Acromioclavicular Joint: No significant degenerative changes type 1 acromion. No lateral downsloping or undersurface spurring.   Glenohumeral Joint: No joint effusion. Mild synovitis versus adhesive capsulitis.   Labrum: Degenerated and possibly torn superior labrum. The anterior and posterior labrum appear normal.   Bones: Very heterogeneous marrow signal in the metadiaphyseal region of the humerus. This is probably related to smoking or anemia. It does not have the MR appearance of osteomyelitis. There is no cortical edema or periosteal reaction  or pericortical fluid. Similar appearance of the scapula and clavicle.   Other: Moderate fluid in the subacromial/subdeltoid bursa. This could be simple bursitis but could not exclude septic bursitis given the patient's situation.   IMPRESSION: 1. Edema like signal abnormality in the infraspinatus and teres minor muscles could suggest a muscle strain/partial tear if there is a history of trauma. Otherwise, this could be myositis. No definite findings for pyomyositis. 2. Moderate subacromial/subdeltoid fluid could be simple bursitis or septic bursitis given the patient's clinical situation. 3. Patchy marrow signal abnormality in the humerus but no definite MR findings to suggest osteomyelitis. 4. Mild rotator cuff tendinopathy/tendinosis.  No tear.   Electronically Signed: By: Rudie Meyer M.D. On: 12/25/2017 12:33 ADDENDUM REPORT: 12/25/2017 13:51   ADDENDUM: I failed to mention that there are numerous rounded pulmonary lesions noted which are highly suspicious for septic emboli. Recommend chest CT with contrast for further evaluation.   These results will be called to the ordering clinician or representative by the  Radiologist Assistant, and communication documented in the PACS or zVision Dashboard.     Electronically Signed   By: Rudie Meyer M.D.   On: 12/25/2017 13:51     I have personally reviewed the imaging studies and the report and my interpretation is:  3 views left shoulder including axillary view no fracture dislocation type I acromion no glenohumeral arthritis or joint space narrowing AC joint normal  MRI left shoulder shows a small amount of subacromial and some deltoid fluid which does not require drainage myositis was also seen    Assessment and Plan  Myositis left shoulder pain left shoulder  Recommend continue antibiotics passive range of motion with physical therapy to tolerance.  No surgical drainage required [id number] [address] [place] 12/26/2017, 8:13 AM   Vickki Hearing MD

## 2017-12-26 NOTE — Progress Notes (Signed)
**Note De-identified Lux Meaders Obfuscation** EKG complete and placed in patient chart 

## 2017-12-27 LAB — COMPREHENSIVE METABOLIC PANEL WITH GFR
Chloride: 109 mmol/L (ref 98–111)
GFR calc Af Amer: >60 mL/min (ref >60)
GFR calc non Af Amer: >60 mL/min (ref >60)
Potassium: 3.2 mmol/L — ABNORMAL LOW (ref 3.5–5.1)

## 2017-12-27 LAB — CBC
HCT: 25.4 % — ABNORMAL LOW (ref 39.0–52.0)
Hemoglobin: 8.1 g/dL — ABNORMAL LOW (ref 13.0–17.0)
MCH: 27.6 pg (ref 26.0–34.0)
MCHC: 31.9 g/dL (ref 30.0–36.0)
MCV: 86.7 fL (ref 80.0–100.0)
Platelets: 271 10*3/uL (ref 150–400)
RBC: 2.93 MIL/uL — ABNORMAL LOW (ref 4.22–5.81)
RDW: 13.4 % (ref 11.5–15.5)
WBC: 10.5 K/uL (ref 4.0–10.5)
nRBC: 0 % (ref 0.0–0.2)

## 2017-12-27 LAB — COMPREHENSIVE METABOLIC PANEL
ALT: 24 U/L (ref 0–44)
AST: 53 U/L — ABNORMAL HIGH (ref 15–41)
Albumin: 1.5 g/dL — ABNORMAL LOW (ref 3.5–5.0)
Alkaline Phosphatase: 156 U/L — ABNORMAL HIGH (ref 38–126)
Anion gap: 8 (ref 5–15)
BUN: 12 mg/dL (ref 6–20)
CO2: 18 mmol/L — ABNORMAL LOW (ref 22–32)
Calcium: 7.8 mg/dL — ABNORMAL LOW (ref 8.9–10.3)
Creatinine, Ser: 0.86 mg/dL (ref 0.61–1.24)
Glucose, Bld: 104 mg/dL — ABNORMAL HIGH (ref 70–99)
Sodium: 135 mmol/L (ref 135–145)
Total Bilirubin: 0.9 mg/dL (ref 0.3–1.2)
Total Protein: 6.4 g/dL — ABNORMAL LOW (ref 6.5–8.1)

## 2017-12-27 LAB — MAGNESIUM: Magnesium: 1.8 mg/dL (ref 1.7–2.4)

## 2017-12-27 MED ORDER — CYCLOBENZAPRINE HCL 10 MG PO TABS
10.0000 mg | ORAL_TABLET | Freq: Three times a day (TID) | ORAL | Status: DC | PRN
  Administered 2017-12-27 – 2018-01-04 (×5): 10 mg via ORAL
  Filled 2017-12-27 (×5): qty 1

## 2017-12-27 MED ORDER — ENSURE ENLIVE PO LIQD
237.0000 mL | Freq: Two times a day (BID) | ORAL | Status: DC
  Administered 2017-12-27 – 2018-01-01 (×10): 237 mL via ORAL

## 2017-12-27 MED ORDER — POTASSIUM CHLORIDE CRYS ER 20 MEQ PO TBCR
40.0000 meq | EXTENDED_RELEASE_TABLET | ORAL | Status: AC
  Administered 2017-12-27 (×2): 40 meq via ORAL
  Filled 2017-12-27 (×2): qty 2

## 2017-12-27 NOTE — Progress Notes (Signed)
Patient has temperature of 101.5. Tylenol 650 mg PO has been administered per PRN order. Will continue to monitor patient.

## 2017-12-27 NOTE — Progress Notes (Addendum)
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  Tricuspid valve vegetation noted on echo on 12/24/2017.  Unfortunately the patient is not a candidate for home antibiotics due to his ongoing drug use. - Continue IV cefazolin started 12/25/2017 -Blood cultures 12/23/2017 growing out MSSA - Blood cultures from 12/25/2017 no growth to date, will consider PEG placement once these are negative -ID consult -Cardiology consult, no plan for TEE  #) Septic pulmonary emboli: Noted on CT of the chest.  #) Abdominal pain/diarrhea: CT abdomen pelvis shows no clear etiology for abdominal pain it is now self resolved.. -CT chest abdomen pelvis pending with contrast  #) Left myositis and septic bursitis: Noted on MRI on 12/25/2017. -Orthopedic surgery does not recommend any aspiration as there is too little fluid  #) L3/4 osteomyelitis, paraspinous phlegmon, right psoas muscle abscess: All likely secondary to endocarditis.  The abscess is not significant sufficiently large for drainage at this time. -Antibiotics per above  #) Anemia: Stable at this time.  Likely multifactorial including pulmonary suppression from sepsis, anemia of chronic disease, possibly iron deficiency anemia -We will consider sending of iron studies, B12, folate  #) IV drug use:  -Counseling  provided -Patient does not want any medication for withdrawal  #) Chronic hepatitis C: -Outpatient treatment  Fluids: Tolerating p.o. Electrodes: Monitor and supplement Nutrition: Regular diet  Prophylaxis: Subcu heparin  Disposition: Unclear at this time however will continue IV antibiotics, will likely need to complete inpatient course  Full code   Consultants:   Infectious disease  Cardiology  Orthopedic surgery  Procedures:  Echo 12/24/2017:- Left ventricle: The cavity size was normal. Wall thickness was   normal. Systolic function was normal. The estimated ejection   fraction was in the range of 55% to 60%. Wall motion was normal;   there were no regional wall motion abnormalities. - Mitral valve: There was trivial regurgitation. - Right atrium: Central venous pressure (est): 3 mm Hg. - Atrial septum: No defect or patent foramen ovale was identified. - Tricuspid valve: Large, homogeneous echodensity associated with   the atrial side of the septal tricuspid leaflet consistent with   vegetation in the setting of documented bacteremia. There was   moderate regurgitation. - Pulmonary arteries: PA peak pressure: 28 mm Hg (S). - Pericardium, extracardiac: A moderate pericardial effusion was   identified posterior to the heart.    Antimicrobials:   IV vancomycin started 12/23/2017 to 12/25/2017  IV aztreonam 12/23/2017 to 12/24/2017  IV cefazolin started 12/25/2017 to ongoing   Subjective: This morning patient reports severe back pain but reports that his shoulder pain is improved.  He continues to report fairly impressive fatigue.  He denies any nausea, vomiting, abdominal pain now.  Objective: Vitals:   12/26/17 0543 12/26/17 1733 12/26/17 2319 12/27/17 0636  BP: 108/75 116/68 107/64 119/69  Pulse: 86 79  85 76  Resp:  20 17 20   Temp: 98.8 F (37.1 C) 99.2 F (37.3 C) 100.2 F (37.9 C) 99 F (37.2 C)  TempSrc: Oral Oral Oral Oral  SpO2: 96% 100% 95% 95%   Weight:      Height:        Intake/Output Summary (Last 24 hours) at 12/27/2017 0911 Last data filed at 12/27/2017 0533 Gross per 24 hour  Intake 307.46 ml  Output 650 ml  Net -342.54 ml   Filed Weights   12/23/17 0931 12/23/17 1627  Weight: 81.6 kg 77.4 kg    Examination:  General exam: Appears calm and comfortable  Respiratory system: No increased work of breathing, no wheezing, no crackles or rhonchi Cardiovascular system: Regular rate and rhythm, 3 out of 6 systolic murmur. Gastrointestinal system: Abdomen is nondistended, soft, non-tender to no rebound or guarding, plus bowel sounds Central nervous system: Alert and oriented.  Bilateral lower extremity strength is 5 out of 5 Extremities: Trace lower extremity edema, some improvement in abduction left arm Skin: well healed IV scars Psychiatry: Judgement and insight appear normal. Mood & affect appropriate.     Data Reviewed: I have personally reviewed following labs and imaging studies  CBC: Recent Labs  Lab 12/23/17 0950 12/24/17 0410 12/25/17 0428 12/26/17 0501 12/27/17 0450  WBC 15.6* 12.9* 12.0* 10.9* 10.5  NEUTROABS 13.5*  --   --   --   --   HGB 12.5* 9.9* 8.4* 8.5* 8.1*  HCT 38.8* 31.2* 26.1* 26.7* 25.4*  MCV 83.4 84.8 85.0 87.5 86.7  PLT 162 152 176 225 271   Basic Metabolic Panel: Recent Labs  Lab 12/23/17 1200 12/23/17 1523 12/24/17 0410 12/25/17 0428 12/26/17 0501 12/27/17 0450  NA 133*  --  137 136 134* 135  K 2.9*  --  3.6 2.7* 3.3* 3.2*  CL 100  --  105 108 110 109  CO2 16*  --  21* 20* 16* 18*  GLUCOSE 126*  --  103* 103* 116* 104*  BUN 37*  --  27* 16 16 12   CREATININE 2.52*  --  1.42* 1.00 1.06 0.86  CALCIUM 6.8*  --  8.1* 7.9* 7.7* 7.8*  MG  --  2.1  --  1.2* 2.2 1.8  PHOS  --   --   --  2.4*  --   --    GFR: Estimated Creatinine Clearance: 130.1 mL/min (by C-G formula based on SCr of 0.86 mg/dL). Liver Function Tests: Recent Labs  Lab 12/23/17 1200 12/26/17 0501  12/27/17 0450  AST 101* 41 53*  ALT 42 23 24  ALKPHOS 143* 158* 156*  BILITOT 1.5* 1.1 0.9  PROT 6.5 6.4* 6.4*  ALBUMIN 2.0* 1.6* 1.5*   No results for input(s): LIPASE, AMYLASE in the last 168 hours. No results for input(s): AMMONIA in the last 168 hours. Coagulation Profile: No results for input(s): INR, PROTIME in the last 168 hours. Cardiac Enzymes: Recent Labs  Lab 12/26/17 0822  TROPONINI 0.04*   BNP (last 3 results) No results for input(s): PROBNP in the last 8760 hours. HbA1C: No results for input(s): HGBA1C in the last 72 hours. CBG: No results for input(s): GLUCAP in the last 168 hours. Lipid Profile: No results for input(s): CHOL, HDL, LDLCALC, TRIG, CHOLHDL, LDLDIRECT in the last 72 hours. Thyroid Function Tests: No results for input(s): TSH, T4TOTAL, FREET4, T3FREE, THYROIDAB in the last 72 hours. Anemia Panel: No results for input(s): VITAMINB12, FOLATE, FERRITIN, TIBC, IRON, RETICCTPCT in the  last 72 hours. Sepsis Labs: Recent Labs  Lab 12/23/17 2130 12/23/17 1431 12/24/17 1841 12/25/17 0428  LATICACIDVEN 4.09* 1.46 2.5* 1.2    Recent Results (from the past 240 hour(s))  Blood Culture (routine x 2)     Status: Abnormal   Collection Time: 12/23/17  9:50 AM  Result Value Ref Range Status   Specimen Description   Final    BLOOD LEFT HAND Performed at [facility], [address] [place], [place] [address]    Special Requests   Final    BOTTLES DRAWN AEROBIC AND ANAEROBIC Blood Culture adequate volume Performed at [facility], [address], [place], [place] [address]    Culture  Setup Time   Final    GRAM POSITIVE COCCI Gram Stain Report Called to,Read Back By and Verified With: JOHNSON,B. AT 2028 ON 12/23/2017 BY EVA ANAEROBIC BOTTLE ONLY Performed at [facility] CRITICAL RESULT CALLED TO, READ BACK BY AND VERIFIED WITHGean Quint RN [id number] 12/24/17 A BROWNING Performed at [facility], [address], [place], [place] [address]     Culture STAPHYLOCOCCUS AUREUS (A)  Final   Report Status 12/26/2017 FINAL  Final   Organism ID, Bacteria STAPHYLOCOCCUS AUREUS  Final      Susceptibility   Staphylococcus aureus - MIC*    CIPROFLOXACIN <=0.5 SENSITIVE Sensitive     ERYTHROMYCIN >=8 RESISTANT Resistant     GENTAMICIN <=0.5 SENSITIVE Sensitive     OXACILLIN 0.5 SENSITIVE Sensitive     TETRACYCLINE <=1 SENSITIVE Sensitive     VANCOMYCIN 1 SENSITIVE Sensitive     TRIMETH/SULFA <=10 SENSITIVE Sensitive     CLINDAMYCIN <=0.25 SENSITIVE Sensitive     RIFAMPIN <=0.5 SENSITIVE Sensitive     Inducible Clindamycin NEGATIVE Sensitive     * STAPHYLOCOCCUS AUREUS  Blood Culture (routine x 2)     Status: Abnormal   Collection Time: 12/23/17  9:50 AM  Result Value Ref Range Status   Specimen Description   Final    BLOOD SITE NOT SPECIFIED Performed at [facility], [address], [place], [place] [address]    Special Requests   Final    BOTTLES DRAWN AEROBIC AND ANAEROBIC Blood Culture results may not be optimal due to an inadequate volume of blood received in culture bottles Performed at [facility], [address], [place], [place] [address]    Culture  Setup Time   Final    GRAM POSITIVE COCCI Gram Stain Report Called to,Read Back By and Verified With: JOHNSON,B. 2129 ON 12/23/2017 BY EVA Performed at [facility] CRITICAL RESULT CALLED TO, READ BACK BY AND VERIFIED WITHGean Quint RN [id number] 12/24/17 A BROWNING AEROBIC AND ANAEROBIC BOTTLES Performed at [facility], [address] [place], [place] [address]    Culture (A)  Final    STAPHYLOCOCCUS AUREUS SUSCEPTIBILITIES PERFORMED ON PREVIOUS CULTURE WITHIN THE LAST 5 DAYS. Performed at [facility], [address], [place], [place] [address]    Report Status 12/26/2017 FINAL  Final  Blood Culture ID Panel (Reflexed)     Status: Abnormal   Collection Time: 12/23/17  9:50 AM  Result Value Ref Range Status   Enterococcus species NOT DETECTED NOT DETECTED  Final   Listeria monocytogenes NOT DETECTED NOT DETECTED Final   Staphylococcus species DETECTED (A) NOT DETECTED Final    Comment: CRITICAL RESULT CALLED TO, READ BACK BY AND VERIFIED WITHGean Quint RN [id number] 12/24/17 A BROWNING    Staphylococcus  aureus (BCID) DETECTED (A) NOT DETECTED Final    Comment: Methicillin (oxacillin) susceptible Staphylococcus aureus (MSSA). Preferred therapy is anti staphylococcal beta lactam antibiotic (Cefazolin or Nafcillin), unless clinically contraindicated. CRITICAL RESULT CALLED TO, READ BACK BY AND VERIFIED WITHGean Quint RN [phone] 12/24/17 A BROWNING    Methicillin resistance NOT DETECTED NOT DETECTED Final   Streptococcus species NOT DETECTED NOT DETECTED Final   Streptococcus agalactiae NOT DETECTED NOT DETECTED Final   Streptococcus pneumoniae NOT DETECTED NOT DETECTED Final   Streptococcus pyogenes NOT DETECTED NOT DETECTED Final   Acinetobacter baumannii NOT DETECTED NOT DETECTED Final   Enterobacteriaceae species NOT DETECTED NOT DETECTED Final   Enterobacter cloacae complex NOT DETECTED NOT DETECTED Final   Escherichia coli NOT DETECTED NOT DETECTED Final   Klebsiella oxytoca NOT DETECTED NOT DETECTED Final   Klebsiella pneumoniae NOT DETECTED NOT DETECTED Final   Proteus species NOT DETECTED NOT DETECTED Final   Serratia marcescens NOT DETECTED NOT DETECTED Final   Haemophilus influenzae NOT DETECTED NOT DETECTED Final   Neisseria meningitidis NOT DETECTED NOT DETECTED Final   Pseudomonas aeruginosa NOT DETECTED NOT DETECTED Final   Candida albicans NOT DETECTED NOT DETECTED Final   Candida glabrata NOT DETECTED NOT DETECTED Final   Candida krusei NOT DETECTED NOT DETECTED Final   Candida parapsilosis NOT DETECTED NOT DETECTED Final   Candida tropicalis NOT DETECTED NOT DETECTED Final    Comment: Performed at [facility], [address] [place], [place] [address]  Urine culture     Status: None   Collection Time: 12/23/17  1:00 PM   Result Value Ref Range Status   Specimen Description   Final    URINE, CLEAN CATCH Performed at [facility], [address] [place], [place] [address]    Special Requests   Final    NONE Performed at [facility], [address] [place], [place] [address]    Culture   Final    NO GROWTH Performed at [facility], [address] [place], [place] [address]    Report Status 12/24/2017 FINAL  Final  Culture, blood (routine x 2)     Status: None (Preliminary result)   Collection Time: 12/25/17  8:00 AM  Result Value Ref Range Status   Specimen Description BLOOD RIGHT WRIST  Final   Special Requests   Final    BOTTLES DRAWN AEROBIC ONLY Blood Culture adequate volume   Culture   Final    NO GROWTH 2 DAYS Performed at [facility], [address] [place], [place] [address]    Report Status PENDING  Incomplete  Culture, blood (routine x 2)     Status: None (Preliminary result)   Collection Time: 12/25/17  8:07 AM  Result Value Ref Range Status   Specimen Description BLOOD LEFT HAND  Final   Special Requests   Final    BOTTLES DRAWN AEROBIC ONLY Blood Culture adequate volume   Culture   Final    NO GROWTH 2 DAYS Performed at [facility], [address] [place], [place] [address]    Report Status PENDING  Incomplete         Radiology Studies: Ct Chest W Contrast  Result Date: 12/26/2017 CLINICAL DATA:  Peritonitis.  L3-4 osteomyelitis. EXAM: CT CHEST, ABDOMEN, AND PELVIS WITH CONTRAST TECHNIQUE: Multidetector CT imaging of the chest, abdomen and pelvis was performed following the standard protocol during bolus administration of intravenous contrast. CONTRAST:  ISOVUE-300 IOPAMIDOL (ISOVUE-300) INJECTION 61% COMPARISON:  MRI of the spine 12/23/2017 FINDINGS: CT CHEST FINDINGS Cardiovascular: No significant vascular findings. Normal heart size. Minimal pericardial effusion. Mediastinum/Nodes: 13 mm in short axis right subcarinal lymph node. Other smaller bilateral  likely reactive mediastinal and hilar lymph nodes. Thyroid gland, trachea, and esophagus demonstrate no significant findings. Lungs/Pleura: Numerous peripheral some cavitating soft tissue masses, likely representing septic emboli. Bilateral small pleural effusions. Bilateral lower lobe atelectasis. Musculoskeletal: No chest wall mass or suspicious bone lesions identified. CT ABDOMEN PELVIS FINDINGS Hepatobiliary: No focal liver abnormality is seen. No gallstones, gallbladder wall thickening, or biliary dilatation. Pancreas: Unremarkable. No pancreatic ductal dilatation or surrounding inflammatory changes. Spleen: Mild splenomegaly with the length of the spleen measuring 13 cm. Adrenals/Urinary Tract: Adrenal glands are unremarkable. Mild perinephric stranding. Kidneys are without renal calculi, focal lesion, or hydronephrosis. Bladder is unremarkable. Stomach/Bowel: Stomach is within normal limits. Appendix appears normal. No evidence of bowel wall thickening, distention, or inflammatory changes. Fluid contents of the colon consistent with a diarrheal state. Vascular/Lymphatic: No significant vascular findings are present. Predominantly left-sided retroperitoneal lymphadenopathy with lymph nodes measuring up to 16 mm in short axis. Enlarged lymph node within the porta hepatis. Reproductive: Prostate is unremarkable. Other: Small amount of free fluid in the abdomen and pelvis. No peritoneal thickening or enhancement. Musculoskeletal: Previously demonstrated right psoas muscle abscess measures 1.9 by 1.2 cm. Known L3-4 discitis/osteomyelitis presents as narrowing of the disc space and lytic and sclerotic changes of the endplates. Associated ventral focal epidural thickening, as seen on prior MRI, causes focal narrowing of the spinal canal. Mild paraspinous edema. IMPRESSION: Innumerable bilateral pulmonary septic emboli. Mild pericardial thickening. Small volume abdominopelvic ascites. No appreciable peritoneal  thickening or enhancement. Mild splenomegaly. Retroperitoneal lymphadenopathy, likely reactive. Known right psoas muscle abscess measures 1.9 cm in greatest dimension. Known L3-4 discitis/osteomyelitis with associated focal ventral epidural thickening causing narrowing of the spinal canal, and mild paraspinous edema. Diarrheal state. Electronically Signed   By: Ted Mcalpine M.D.   On: 12/26/2017 12:08   Ct Abdomen Pelvis W Contrast  Result Date: 12/26/2017 CLINICAL DATA:  Peritonitis.  L3-4 osteomyelitis. EXAM: CT CHEST, ABDOMEN, AND PELVIS WITH CONTRAST TECHNIQUE: Multidetector CT imaging of the chest, abdomen and pelvis was performed following the standard protocol during bolus administration of intravenous contrast. CONTRAST:  ISOVUE-300 IOPAMIDOL (ISOVUE-300) INJECTION 61% COMPARISON:  MRI of the spine 12/23/2017 FINDINGS: CT CHEST FINDINGS Cardiovascular: No significant vascular findings. Normal heart size. Minimal pericardial effusion. Mediastinum/Nodes: 13 mm in short axis right subcarinal lymph node. Other smaller bilateral likely reactive mediastinal and hilar lymph nodes. Thyroid gland, trachea, and esophagus demonstrate no significant findings. Lungs/Pleura: Numerous peripheral some cavitating soft tissue masses, likely representing septic emboli. Bilateral small pleural effusions. Bilateral lower lobe atelectasis. Musculoskeletal: No chest wall mass or suspicious bone lesions identified. CT ABDOMEN PELVIS FINDINGS Hepatobiliary: No focal liver abnormality is seen. No gallstones, gallbladder wall thickening, or biliary dilatation. Pancreas: Unremarkable. No pancreatic ductal dilatation or surrounding inflammatory changes. Spleen: Mild splenomegaly with the length of the spleen measuring 13 cm. Adrenals/Urinary Tract: Adrenal glands are unremarkable. Mild perinephric stranding. Kidneys are without renal calculi, focal lesion, or hydronephrosis. Bladder is unremarkable. Stomach/Bowel:  Stomach is within normal limits. Appendix appears normal. No evidence of bowel wall thickening, distention, or inflammatory changes. Fluid contents of the colon consistent with a diarrheal state. Vascular/Lymphatic: No significant vascular findings are present. Predominantly left-sided retroperitoneal lymphadenopathy with lymph nodes measuring up to 16 mm in short axis. Enlarged lymph node within the porta hepatis.  Reproductive: Prostate is unremarkable. Other: Small amount of free fluid in the abdomen and pelvis. No peritoneal thickening or enhancement. Musculoskeletal: Previously demonstrated right psoas muscle abscess measures 1.9 by 1.2 cm. Known L3-4 discitis/osteomyelitis presents as narrowing of the disc space and lytic and sclerotic changes of the endplates. Associated ventral focal epidural thickening, as seen on prior MRI, causes focal narrowing of the spinal canal. Mild paraspinous edema. IMPRESSION: Innumerable bilateral pulmonary septic emboli. Mild pericardial thickening. Small volume abdominopelvic ascites. No appreciable peritoneal thickening or enhancement. Mild splenomegaly. Retroperitoneal lymphadenopathy, likely reactive. Known right psoas muscle abscess measures 1.9 cm in greatest dimension. Known L3-4 discitis/osteomyelitis with associated focal ventral epidural thickening causing narrowing of the spinal canal, and mild paraspinous edema. Diarrheal state. Electronically Signed   By: Ted Mcalpine M.D.   On: 12/26/2017 12:08   Mr Shoulder Left Wo Contrast  Addendum Date: 12/25/2017   ADDENDUM REPORT: 12/25/2017 13:51 ADDENDUM: I failed to mention that there are numerous rounded pulmonary lesions noted which are highly suspicious for septic emboli. Recommend chest CT with contrast for further evaluation. These results will be called to the ordering clinician or representative by the Radiologist Assistant, and communication documented in the PACS or zVision Dashboard. Electronically  Signed   By: Rudie Meyer M.D.   On: 12/25/2017 13:51   Result Date: 12/25/2017 CLINICAL DATA:  Left shoulder pain. 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 EXAM: MRI OF THE LEFT SHOULDER WITHOUT CONTRAST TECHNIQUE: Multiplanar, multisequence MR imaging of the shoulder was performed. No intravenous contrast was administered. COMPARISON:  Radiographs 12/24/2017 FINDINGS: Rotator cuff: Mild tendinopathy/tendinosis. No partial or full-thickness rotator cuff tear. Muscles: There is moderate edema like signal abnormality in the infraspinatus and teres minor muscles suggesting myositis. If there is a history of trauma this could be a muscle tear or muscle strain. I do not see any findings suspicious for pyomyositis. Biceps long head:  Intact. Acromioclavicular Joint: No significant degenerative changes type 1 acromion. No lateral downsloping or undersurface spurring. Glenohumeral Joint: No joint effusion. Mild synovitis versus adhesive capsulitis. Labrum: Degenerated and possibly torn superior labrum. The anterior and posterior labrum appear normal. Bones: Very heterogeneous marrow signal in the metadiaphyseal region of the humerus. This is probably related to smoking or anemia. It does not have the MR appearance of osteomyelitis. There is no cortical edema or periosteal reaction or pericortical fluid. Similar appearance of the scapula and clavicle. Other: Moderate fluid in the subacromial/subdeltoid bursa. This could be simple bursitis but could not exclude septic bursitis given the patient's situation. IMPRESSION: 1. Edema like signal abnormality in the infraspinatus and teres minor muscles could suggest a muscle strain/partial tear if there is a history of trauma. Otherwise, this could be myositis. No definite findings for pyomyositis. 2. Moderate subacromial/subdeltoid fluid could be simple bursitis or septic bursitis given the patient's clinical situation. 3. Patchy marrow signal abnormality in the humerus  but no definite MR findings to suggest osteomyelitis. 4. Mild rotator cuff tendinopathy/tendinosis.  No tear. Electronically Signed: By: Rudie Meyer M.D. On: 12/25/2017 12:33        Scheduled Meds: . heparin injection (subcutaneous)  5,000 Units Subcutaneous Q8H  . nicotine  21 mg Transdermal Daily  . nystatin  5 mL Oral QID  . potassium chloride  40 mEq Oral Q4H  . saccharomyces boulardii  250 mg Oral BID  . sodium chloride flush  3 mL Intravenous Q12H   Continuous Infusions: . sodium chloride Stopped (12/26/17 2329)  .  ceFAZolin (  ANCEF) IV 2 g (12/27/17 0530)     LOS: 4 days    Time spent: 35    Delaine Lame, MD [facility]  If 7PM-7AM, please contact night-coverage [email or web address] Password [email or web address] 12/27/2017, 9:11 AM

## 2017-12-28 LAB — BASIC METABOLIC PANEL WITH GFR
BUN: 11 mg/dL (ref 6–20)
Glucose, Bld: 111 mg/dL — ABNORMAL HIGH (ref 70–99)
Potassium: 3.5 mmol/L (ref 3.5–5.1)

## 2017-12-28 LAB — BASIC METABOLIC PANEL
Anion gap: 7 (ref 5–15)
CO2: 19 mmol/L — ABNORMAL LOW (ref 22–32)
Calcium: 7.9 mg/dL — ABNORMAL LOW (ref 8.9–10.3)
Chloride: 108 mmol/L (ref 98–111)
Creatinine, Ser: 0.7 mg/dL (ref 0.61–1.24)
GFR calc Af Amer: >60 mL/min (ref >60)
GFR calc non Af Amer: >60 mL/min (ref >60)
Sodium: 134 mmol/L — ABNORMAL LOW (ref 135–145)

## 2017-12-28 LAB — CBC
HCT: 24.9 % — ABNORMAL LOW (ref 39.0–52.0)
Hemoglobin: 7.8 g/dL — ABNORMAL LOW (ref 13.0–17.0)
MCH: 27.1 pg (ref 26.0–34.0)
MCHC: 31.3 g/dL (ref 30.0–36.0)
MCV: 86.5 fL (ref 80.0–100.0)
Platelets: 308 10*3/uL (ref 150–400)
RBC: 2.88 MIL/uL — ABNORMAL LOW (ref 4.22–5.81)
RDW: 13.2 % (ref 11.5–15.5)
WBC: 11.4 10*3/uL — ABNORMAL HIGH (ref 4.0–10.5)
nRBC: 0 % (ref 0.0–0.2)

## 2017-12-28 MED ORDER — MORPHINE SULFATE (PF) 4 MG/ML IV SOLN
4.0000 mg | INTRAVENOUS | Status: DC | PRN
  Administered 2017-12-28 – 2018-01-04 (×39): 4 mg via INTRAVENOUS
  Filled 2017-12-28 (×40): qty 1

## 2017-12-28 MED ORDER — OXYCODONE HCL 5 MG PO TABS
10.0000 mg | ORAL_TABLET | ORAL | Status: DC | PRN
  Administered 2017-12-28 – 2018-01-17 (×66): 10 mg via ORAL
  Filled 2017-12-28 (×69): qty 2

## 2017-12-28 NOTE — Progress Notes (Signed)
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  Tricuspid valve vegetation noted on echo on 12/24/2017.  Unfortunately the patient is not a candidate for home antibiotics due to his ongoing drug use. - Continue IV cefazolin started 12/25/2017 -Blood cultures 12/23/2017 growing out MSSA - Blood cultures from 12/25/2017 no growth to date, will consider PICC placement once these are negative for 3-5 days -ID consult -Cardiology consult, no plan for TEE  #) Septic pulmonary emboli: Noted on CT of the chest.  #) Abdominal pain/diarrhea: CT abdomen pelvis shows no clear etiology for abdominal pain it is now self resolved.. -CT chest abdomen pelvis pending with contrast  #) Left shoulder myositis and septic bursitis: Noted on MRI on 12/25/2017. -Orthopedic surgery does not recommend any aspiration as there is too little fluid  #) L3/4 osteomyelitis, paraspinous phlegmon, right psoas muscle abscess: All likely secondary to endocarditis.  The abscess is not significant sufficiently large for drainage at this time. -Antibiotics per above -Increased pain control  #) Anemia: Stable at this time.  Likely multifactorial including pulmonary suppression from sepsis, anemia of chronic disease, possibly iron deficiency anemia -We will consider sending of iron studies, B12,  folate  #) IV drug use:  -Counseling provided -Patient does not want any medication for withdrawal  #) Chronic hepatitis C: -Outpatient treatment  Fluids: Tolerating p.o. Electrodes: Monitor and supplement Nutrition: Regular diet  Prophylaxis: Subcu heparin  Disposition: Unclear at this time however will continue IV antibiotics, will likely need to complete inpatient course  Full code   Consultants:   Infectious disease  Cardiology  Orthopedic surgery  Procedures:  Echo 12/24/2017:- Left ventricle: The cavity size was normal. Wall thickness was   normal. Systolic function was normal. The estimated ejection   fraction was in the range of 55% to 60%. Wall motion was normal;   there were no regional wall motion abnormalities. - Mitral valve: There was trivial regurgitation. - Right atrium: Central venous pressure (est): 3 mm Hg. - Atrial septum: No defect or patent foramen ovale was identified. - Tricuspid valve: Large, homogeneous echodensity associated with   the atrial side of the septal tricuspid leaflet consistent with   vegetation in the setting of documented bacteremia. There was   moderate regurgitation. - Pulmonary arteries: PA peak pressure: 28 mm Hg (S). - Pericardium, extracardiac: A moderate pericardial effusion was   identified posterior to the heart.    Antimicrobials:   IV vancomycin started 12/23/2017 to 12/25/2017  IV aztreonam 12/23/2017 to 12/24/2017  IV cefazolin started 12/25/2017 to ongoing   Subjective: This morning patient reports severe back pain but otherwise reports he is feeling better.  He denies any nausea, vomiting, diarrhea.  He continues to have significant pain but no lower examinee weakness or numbness or tingling.  He has not had any more chest pain.  Objective:  Vitals:   12/27/17 1408 12/27/17 1829 12/27/17 2154 12/28/17 0545  BP: 109/65  112/74 118/73  Pulse: 78  68 80  Resp: 18  16 16   Temp: 100.2 F (37.9 C) (!) 101.5  F (38.6 C) 99.4 F (37.4 C) 98.4 F (36.9 C)  TempSrc: Oral Oral Oral Oral  SpO2: 98%  96% 97%  Weight:      Height:        Intake/Output Summary (Last 24 hours) at 12/28/2017 0936 Last data filed at 12/28/2017 0115 Gross per 24 hour  Intake 1095.31 ml  Output 1525 ml  Net -429.69 ml   Filed Weights   12/23/17 0931 12/23/17 1627  Weight: 81.6 kg 77.4 kg    Examination:  General exam: Appears calm and comfortable  Respiratory system: No increased work of breathing, no wheezing, no crackles or rhonchi Cardiovascular system: Regular rate and rhythm, 3 out of 6 systolic murmur. Gastrointestinal system: Abdomen is nondistended, soft, non-tender to no rebound or guarding, plus bowel sounds Central nervous system: Alert and oriented.  Bilateral lower extremity strength is 5 out of 5, intact distal sensation, some tenderness over palpation over mid and lower spine Extremities: Trace lower extremity edema, some improvement in abduction left arm Skin: well healed IV scars Psychiatry: Judgement and insight appear normal. Mood & affect appropriate.     Data Reviewed: I have personally reviewed following labs and imaging studies  CBC: Recent Labs  Lab 12/23/17 0950 12/24/17 0410 12/25/17 0428 12/26/17 0501 12/27/17 0450 12/28/17 0648  WBC 15.6* 12.9* 12.0* 10.9* 10.5 11.4*  NEUTROABS 13.5*  --   --   --   --   --   HGB 12.5* 9.9* 8.4* 8.5* 8.1* 7.8*  HCT 38.8* 31.2* 26.1* 26.7* 25.4* 24.9*  MCV 83.4 84.8 85.0 87.5 86.7 86.5  PLT 162 152 176 225 271 308   Basic Metabolic Panel: Recent Labs  Lab 12/23/17 1523 12/24/17 0410 12/25/17 0428 12/26/17 0501 12/27/17 0450 12/28/17 0648  NA  --  137 136 134* 135 134*  K  --  3.6 2.7* 3.3* 3.2* 3.5  CL  --  105 108 110 109 108  CO2  --  21* 20* 16* 18* 19*  GLUCOSE  --  103* 103* 116* 104* 111*  BUN  --  27* 16 16 12 11   CREATININE  --  1.42* 1.00 1.06 0.86 0.70  CALCIUM  --  8.1* 7.9* 7.7* 7.8* 7.9*  MG 2.1  --  1.2*  2.2 1.8  --   PHOS  --   --  2.4*  --   --   --    GFR: Estimated Creatinine Clearance: 139.9 mL/min (by C-G formula based on SCr of 0.7 mg/dL). Liver Function Tests: Recent Labs  Lab 12/23/17 1200 12/26/17 0501 12/27/17 0450  AST 101* 41 53*  ALT 42 23 24  ALKPHOS 143* 158* 156*  BILITOT 1.5* 1.1 0.9  PROT 6.5 6.4* 6.4*  ALBUMIN 2.0* 1.6* 1.5*   No results for input(s): LIPASE, AMYLASE in the last 168 hours. No results for input(s): AMMONIA in the last 168 hours. Coagulation Profile: No results for input(s): INR, PROTIME in the last 168 hours. Cardiac Enzymes: Recent Labs  Lab 12/26/17 0822  TROPONINI 0.04*   BNP (last 3 results) No results for input(s): PROBNP in the last 8760 hours. HbA1C: No results for input(s): HGBA1C in the last 72 hours. CBG: No results for input(s): GLUCAP in the last 168 hours. Lipid Profile: No results  for input(s): CHOL, HDL, LDLCALC, TRIG, CHOLHDL, LDLDIRECT in the last 72 hours. Thyroid Function Tests: No results for input(s): TSH, T4TOTAL, FREET4, T3FREE, THYROIDAB in the last 72 hours. Anemia Panel: No results for input(s): VITAMINB12, FOLATE, FERRITIN, TIBC, IRON, RETICCTPCT in the last 72 hours. Sepsis Labs: Recent Labs  Lab 12/23/17 1610 12/23/17 1431 12/24/17 1841 12/25/17 0428  LATICACIDVEN 4.09* 1.46 2.5* 1.2    Recent Results (from the past 240 hour(s))  Blood Culture (routine x 2)     Status: Abnormal   Collection Time: 12/23/17  9:50 AM  Result Value Ref Range Status   Specimen Description   Final    BLOOD LEFT HAND Performed at [facility], [address], [place], [place] [address]    Special Requests   Final    BOTTLES DRAWN AEROBIC AND ANAEROBIC Blood Culture adequate volume Performed at [facility], [address], [place], [place] [address]    Culture  Setup Time   Final    GRAM POSITIVE COCCI Gram Stain Report Called to,Read Back By and Verified With: JOHNSON,B. AT 2028 ON 12/23/2017 BY EVA ANAEROBIC  BOTTLE ONLY Performed at [facility] CRITICAL RESULT CALLED TO, READ BACK BY AND VERIFIED WITHGean Quint RN 1914 12/24/17 A BROWNING Performed at [facility], [address], [place], [place] [address]    Culture STAPHYLOCOCCUS AUREUS (A)  Final   Report Status 12/26/2017 FINAL  Final   Organism ID, Bacteria STAPHYLOCOCCUS AUREUS  Final      Susceptibility   Staphylococcus aureus - MIC*    CIPROFLOXACIN <=0.5 SENSITIVE Sensitive     ERYTHROMYCIN >=8 RESISTANT Resistant     GENTAMICIN <=0.5 SENSITIVE Sensitive     OXACILLIN 0.5 SENSITIVE Sensitive     TETRACYCLINE <=1 SENSITIVE Sensitive     VANCOMYCIN 1 SENSITIVE Sensitive     TRIMETH/SULFA <=10 SENSITIVE Sensitive     CLINDAMYCIN <=0.25 SENSITIVE Sensitive     RIFAMPIN <=0.5 SENSITIVE Sensitive     Inducible Clindamycin NEGATIVE Sensitive     * STAPHYLOCOCCUS AUREUS  Blood Culture (routine x 2)     Status: Abnormal   Collection Time: 12/23/17  9:50 AM  Result Value Ref Range Status   Specimen Description   Final    BLOOD SITE NOT SPECIFIED Performed at [facility], [address] [place], [place] [address]    Special Requests   Final    BOTTLES DRAWN AEROBIC AND ANAEROBIC Blood Culture results may not be optimal due to an inadequate volume of blood received in culture bottles Performed at [facility], [address], [place], [place] [address]    Culture  Setup Time   Final    GRAM POSITIVE COCCI Gram Stain Report Called to,Read Back By and Verified With: JOHNSON,B. 2129 ON 12/23/2017 BY EVA Performed at [facility] CRITICAL RESULT CALLED TO, READ BACK BY AND VERIFIED WITHGean Quint RN [id number] 12/24/17 A BROWNING AEROBIC AND ANAEROBIC BOTTLES Performed at [facility], [address] [place], [place] [address]    Culture (A)  Final    STAPHYLOCOCCUS AUREUS SUSCEPTIBILITIES PERFORMED ON PREVIOUS CULTURE WITHIN THE LAST 5 DAYS. Performed at [facility], [address], [place], [place]  [address]    Report Status 12/26/2017 FINAL  Final  Blood Culture ID Panel (Reflexed)     Status: Abnormal   Collection Time: 12/23/17  9:50 AM  Result Value Ref Range Status   Enterococcus species NOT DETECTED NOT DETECTED  Final   Listeria monocytogenes NOT DETECTED NOT DETECTED Final   Staphylococcus species DETECTED (A) NOT DETECTED Final    Comment: CRITICAL RESULT CALLED TO, READ BACK BY AND VERIFIED WITHGean Quint RN [phone] 12/24/17 A BROWNING    Staphylococcus aureus (BCID) DETECTED (A) NOT DETECTED Final    Comment: Methicillin (oxacillin) susceptible Staphylococcus aureus (MSSA). Preferred therapy is anti staphylococcal beta lactam antibiotic (Cefazolin or Nafcillin), unless clinically contraindicated. CRITICAL RESULT CALLED TO, READ BACK BY AND VERIFIED WITHGean Quint RN [phone] 12/24/17 A BROWNING    Methicillin resistance NOT DETECTED NOT DETECTED Final   Streptococcus species NOT DETECTED NOT DETECTED Final   Streptococcus agalactiae NOT DETECTED NOT DETECTED Final   Streptococcus pneumoniae NOT DETECTED NOT DETECTED Final   Streptococcus pyogenes NOT DETECTED NOT DETECTED Final   Acinetobacter baumannii NOT DETECTED NOT DETECTED Final   Enterobacteriaceae species NOT DETECTED NOT DETECTED Final   Enterobacter cloacae complex NOT DETECTED NOT DETECTED Final   Escherichia coli NOT DETECTED NOT DETECTED Final   Klebsiella oxytoca NOT DETECTED NOT DETECTED Final   Klebsiella pneumoniae NOT DETECTED NOT DETECTED Final   Proteus species NOT DETECTED NOT DETECTED Final   Serratia marcescens NOT DETECTED NOT DETECTED Final   Haemophilus influenzae NOT DETECTED NOT DETECTED Final   Neisseria meningitidis NOT DETECTED NOT DETECTED Final   Pseudomonas aeruginosa NOT DETECTED NOT DETECTED Final   Candida albicans NOT DETECTED NOT DETECTED Final   Candida glabrata NOT DETECTED NOT DETECTED Final   Candida krusei NOT DETECTED NOT DETECTED Final   Candida parapsilosis NOT DETECTED NOT  DETECTED Final   Candida tropicalis NOT DETECTED NOT DETECTED Final    Comment: Performed at [facility], [address] [place], [place] [address]  Urine culture     Status: None   Collection Time: 12/23/17  1:00 PM  Result Value Ref Range Status   Specimen Description   Final    URINE, CLEAN CATCH Performed at [facility], [address], [place], [place] [address]    Special Requests   Final    NONE Performed at [facility], [address] [place], [place] [address]    Culture   Final    NO GROWTH Performed at [facility], [address], [place], [place] [address]    Report Status 12/24/2017 FINAL  Final  Culture, blood (routine x 2)     Status: None (Preliminary result)   Collection Time: 12/25/17  8:00 AM  Result Value Ref Range Status   Specimen Description BLOOD RIGHT WRIST  Final   Special Requests   Final    BOTTLES DRAWN AEROBIC ONLY Blood Culture adequate volume   Culture   Final    NO GROWTH 2 DAYS Performed at [facility], [address] [place], [place] [address]    Report Status PENDING  Incomplete  Culture, blood (routine x 2)     Status: None (Preliminary result)   Collection Time: 12/25/17  8:07 AM  Result Value Ref Range Status   Specimen Description BLOOD LEFT HAND  Final   Special Requests   Final    BOTTLES DRAWN AEROBIC ONLY Blood Culture adequate volume   Culture   Final    NO GROWTH 2 DAYS Performed at [facility], [address], [place], [place] [address]    Report Status PENDING  Incomplete         Radiology Studies: Ct Chest W Contrast  Result Date: 12/26/2017 CLINICAL DATA:  Peritonitis.  L3-4 osteomyelitis. EXAM: CT CHEST, ABDOMEN, AND PELVIS WITH CONTRAST TECHNIQUE: Multidetector CT imaging of the chest, abdomen and pelvis was performed following the standard protocol during bolus administration of intravenous contrast. CONTRAST:  ISOVUE-300 IOPAMIDOL (ISOVUE-300) INJECTION 61% COMPARISON:  MRI of the spine  12/23/2017 FINDINGS: CT CHEST FINDINGS Cardiovascular: No significant vascular findings. Normal heart size. Minimal pericardial effusion. Mediastinum/Nodes: 13 mm in short axis right subcarinal lymph node. Other smaller bilateral likely reactive mediastinal and hilar lymph nodes. Thyroid gland, trachea, and esophagus demonstrate no significant findings. Lungs/Pleura: Numerous peripheral some cavitating soft tissue masses, likely representing septic emboli. Bilateral small pleural effusions. Bilateral lower lobe atelectasis. Musculoskeletal: No chest wall mass or suspicious bone lesions identified. CT ABDOMEN PELVIS FINDINGS Hepatobiliary: No focal liver abnormality is seen. No gallstones, gallbladder wall thickening, or biliary dilatation. Pancreas: Unremarkable. No pancreatic ductal dilatation or surrounding inflammatory changes. Spleen: Mild splenomegaly with the length of the spleen measuring 13 cm. Adrenals/Urinary Tract: Adrenal glands are unremarkable. Mild perinephric stranding. Kidneys are without renal calculi, focal lesion, or hydronephrosis. Bladder is unremarkable. Stomach/Bowel: Stomach is within normal limits. Appendix appears normal. No evidence of bowel wall thickening, distention, or inflammatory changes. Fluid contents of the colon consistent with a diarrheal state. Vascular/Lymphatic: No significant vascular findings are present. Predominantly left-sided retroperitoneal lymphadenopathy with lymph nodes measuring up to 16 mm in short axis. Enlarged lymph node within the porta hepatis. Reproductive: Prostate is unremarkable. Other: Small amount of free fluid in the abdomen and pelvis. No peritoneal thickening or enhancement. Musculoskeletal: Previously demonstrated right psoas muscle abscess measures 1.9 by 1.2 cm. Known L3-4 discitis/osteomyelitis presents as narrowing of the disc space and lytic and sclerotic changes of the endplates. Associated ventral focal epidural thickening, as seen on prior  MRI, causes focal narrowing of the spinal canal. Mild paraspinous edema. IMPRESSION: Innumerable bilateral pulmonary septic emboli. Mild pericardial thickening. Small volume abdominopelvic ascites. No appreciable peritoneal thickening or enhancement. Mild splenomegaly. Retroperitoneal lymphadenopathy, likely reactive. Known right psoas muscle abscess measures 1.9 cm in greatest dimension. Known L3-4 discitis/osteomyelitis with associated focal ventral epidural thickening causing narrowing of the spinal canal, and mild paraspinous edema. Diarrheal state. Electronically Signed   By: Ted Mcalpine M.D.   On: 12/26/2017 12:08   Ct Abdomen Pelvis W Contrast  Result Date: 12/26/2017 CLINICAL DATA:  Peritonitis.  L3-4 osteomyelitis. EXAM: CT CHEST, ABDOMEN, AND PELVIS WITH CONTRAST TECHNIQUE: Multidetector CT imaging of the chest, abdomen and pelvis was performed following the standard protocol during bolus administration of intravenous contrast. CONTRAST:  ISOVUE-300 IOPAMIDOL (ISOVUE-300) INJECTION 61% COMPARISON:  MRI of the spine 12/23/2017 FINDINGS: CT CHEST FINDINGS Cardiovascular: No significant vascular findings. Normal heart size. Minimal pericardial effusion. Mediastinum/Nodes: 13 mm in short axis right subcarinal lymph node. Other smaller bilateral likely reactive mediastinal and hilar lymph nodes. Thyroid gland, trachea, and esophagus demonstrate no significant findings. Lungs/Pleura: Numerous peripheral some cavitating soft tissue masses, likely representing septic emboli. Bilateral small pleural effusions. Bilateral lower lobe atelectasis. Musculoskeletal: No chest wall mass or suspicious bone lesions identified. CT ABDOMEN PELVIS FINDINGS Hepatobiliary: No focal liver abnormality is seen. No gallstones, gallbladder wall thickening, or biliary dilatation. Pancreas: Unremarkable. No pancreatic ductal dilatation or surrounding inflammatory changes. Spleen: Mild splenomegaly with the length of  the spleen measuring 13 cm. Adrenals/Urinary Tract: Adrenal glands are unremarkable. Mild perinephric stranding. Kidneys are without renal calculi, focal lesion, or hydronephrosis. Bladder is unremarkable. Stomach/Bowel: Stomach is within normal limits. Appendix appears normal. No evidence of bowel wall  thickening, distention, or inflammatory changes. Fluid contents of the colon consistent with a diarrheal state. Vascular/Lymphatic: No significant vascular findings are present. Predominantly left-sided retroperitoneal lymphadenopathy with lymph nodes measuring up to 16 mm in short axis. Enlarged lymph node within the porta hepatis. Reproductive: Prostate is unremarkable. Other: Small amount of free fluid in the abdomen and pelvis. No peritoneal thickening or enhancement. Musculoskeletal: Previously demonstrated right psoas muscle abscess measures 1.9 by 1.2 cm. Known L3-4 discitis/osteomyelitis presents as narrowing of the disc space and lytic and sclerotic changes of the endplates. Associated ventral focal epidural thickening, as seen on prior MRI, causes focal narrowing of the spinal canal. Mild paraspinous edema. IMPRESSION: Innumerable bilateral pulmonary septic emboli. Mild pericardial thickening. Small volume abdominopelvic ascites. No appreciable peritoneal thickening or enhancement. Mild splenomegaly. Retroperitoneal lymphadenopathy, likely reactive. Known right psoas muscle abscess measures 1.9 cm in greatest dimension. Known L3-4 discitis/osteomyelitis with associated focal ventral epidural thickening causing narrowing of the spinal canal, and mild paraspinous edema. Diarrheal state. Electronically Signed   By: Ted Mcalpine M.D.   On: 12/26/2017 12:08        Scheduled Meds: . feeding supplement (ENSURE ENLIVE)  237 mL Oral BID BM  . heparin injection (subcutaneous)  5,000 Units Subcutaneous Q8H  . nicotine  21 mg Transdermal Daily  . nystatin  5 mL Oral QID  . saccharomyces boulardii   250 mg Oral BID  . sodium chloride flush  3 mL Intravenous Q12H   Continuous Infusions: . sodium chloride Stopped (12/28/17 0541)  .  ceFAZolin (ANCEF) IV 2 g (12/28/17 0540)     LOS: 5 days    Time spent: 35    Delaine Lame, MD [facility]  If 7PM-7AM, please contact night-coverage [email or web address] Password [facility] 12/28/2017, 9:36 AM

## 2017-12-28 NOTE — Progress Notes (Signed)
Pharmacy Antibiotic Note  Howard Diaz is a 32 y.o. male admitted on 12/23/2017 with MSSA bacteremia/endocarditis.  Pharmacy has been consulted for cefazolin dosing.  Per patient, he has tolerated keflex in the past despite penicillin allergy. 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  Tricuspid valve vegetation noted on echo on 12/24/2017. Tmax 101.5, . LA normalized. BCX from 11/7 remain negative.   Plan: Continue Cefazolin 2000 mg IV every 8 hours. Monitor labs, c/s, and patient improvement.  Height: 5\' 11"  (180.3 cm) Weight: 170 lb 10.2 oz (77.4 kg) IBW/kg (Calculated) : 75.3  Temp (24hrs), Avg:99.9 F (37.7 C), Min:98.4 F (36.9 C), Max:101.5 F (38.6 C)  Recent Labs  Lab 12/23/17 0952  12/23/17 1431 12/24/17 0410 12/24/17 1841 12/25/17 0428 12/26/17 0501 12/27/17 0450 12/28/17 0648  WBC  --   --   --  12.9*  --  12.0* 10.9* 10.5 11.4*  CREATININE  --    < >  --  1.42*  --  1.00 1.06 0.86 0.70  LATICACIDVEN 4.09*  --  1.46  --  2.5* 1.2  --   --   --    < > = values in this interval not displayed.    Estimated Creatinine Clearance: 139.9 mL/min (by C-G formula based on SCr of 0.7 mg/dL).    Allergies  Allergen Reactions  . Penicillins Anaphylaxis    Has patient had a PCN reaction causing immediate rash, facial/tongue/throat swelling, SOB or lightheadedness with hypotension: Yes Has patient had a PCN reaction causing severe rash involving mucus membranes or skin necrosis: Yes Has patient had a PCN reaction that required hospitalization:Yes Has patient had a PCN reaction occurring within the last 10 years: No If all of the above answers are "NO", then may proceed with Cephalosporin use.     Antimicrobials this admission: Vanco 11/5 >> 11/7 Aztreonam 11/5 >> 11/6 Levaquin 11/5 >>11/5 Cefazolin 11/7 >>  Microbiology results: 11/5 BCx: MSSA 11/5 UCx: ng  Thank you for allowing pharmacy to be a part of this patient's care.  Elder Cyphers, BS Loura Back, [place] Clinical Pharmacist Pager [phone] 12/28/2017 12:15 PM

## 2017-12-29 LAB — BASIC METABOLIC PANEL WITH GFR
Anion gap: 6 (ref 5–15)
BUN: 11 mg/dL (ref 6–20)
CO2: 20 mmol/L — ABNORMAL LOW (ref 22–32)
Chloride: 109 mmol/L (ref 98–111)
GFR calc non Af Amer: >60 mL/min (ref >60)
Glucose, Bld: 99 mg/dL (ref 70–99)
Potassium: 3.6 mmol/L (ref 3.5–5.1)

## 2017-12-29 LAB — CBC
HCT: 24.7 % — ABNORMAL LOW (ref 39.0–52.0)
Hemoglobin: 7.8 g/dL — ABNORMAL LOW (ref 13.0–17.0)
MCH: 27.5 pg (ref 26.0–34.0)
MCHC: 31.6 g/dL (ref 30.0–36.0)
MCV: 87 fL (ref 80.0–100.0)
Platelets: 359 K/uL (ref 150–400)
RBC: 2.84 MIL/uL — ABNORMAL LOW (ref 4.22–5.81)
RDW: 13.2 % (ref 11.5–15.5)
WBC: 10.1 10*3/uL (ref 4.0–10.5)
nRBC: 0 % (ref 0.0–0.2)

## 2017-12-29 LAB — BASIC METABOLIC PANEL
Calcium: 7.9 mg/dL — ABNORMAL LOW (ref 8.9–10.3)
Creatinine, Ser: 0.8 mg/dL (ref 0.61–1.24)
GFR calc Af Amer: >60 mL/min (ref >60)
Sodium: 135 mmol/L (ref 135–145)

## 2017-12-29 MED ORDER — SODIUM CHLORIDE 0.9% FLUSH
10.0000 mL | Freq: Two times a day (BID) | INTRAVENOUS | Status: DC
  Administered 2017-12-29 – 2018-01-21 (×40): 10 mL

## 2017-12-29 MED ORDER — SODIUM CHLORIDE 0.9% FLUSH: 10.0000 mL | INTRAVENOUS | Status: DC | PRN

## 2017-12-29 NOTE — Progress Notes (Signed)
Peripherally Inserted Central Catheter/Midline Placement  The IV Nurse has discussed with the patient and/or persons authorized to consent for the patient, the purpose of this procedure and the potential benefits and risks involved with this procedure.  The benefits include less needle sticks, lab draws from the catheter, and the patient may be discharged home with the catheter. Risks include, but not limited to, infection, bleeding, blood clot (thrombus formation), and puncture of an artery; nerve damage and irregular heartbeat and possibility to perform a PICC exchange if needed/ordered by physician.  Alternatives to this procedure were also discussed.  Bard Power PICC patient education guide, fact sheet on infection prevention and patient information card has been provided to patient /or left at bedside.    PICC/Midline Placement Documentation  PICC Single Lumen 12/29/17 PICC Right Brachial 38 cm 0 cm (Active)  Indication for Insertion or Continuance of Line Prolonged intravenous therapies 12/29/2017  6:51 PM  Exposed Catheter (cm) 0 cm 12/29/2017  6:51 PM  Site Assessment Clean;Dry;Intact 12/29/2017  6:51 PM  Line Status Flushed;Saline locked;Blood return noted 12/29/2017  6:51 PM  Dressing Type Transparent;Securing device 12/29/2017  6:51 PM  Dressing Status Clean;Dry;Intact;Antimicrobial disc in place 12/29/2017  6:51 PM  Dressing Change Due 01/05/18 12/29/2017  6:51 PM       Howard Diaz 12/29/2017, 6:55 PM

## 2017-12-29 NOTE — Progress Notes (Signed)
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  Tricuspid valve vegetation noted on echo on 12/24/2017.  Unfortunately the patient is not a candidate for home antibiotics due to his ongoing drug use. - Continue IV cefazolin started 12/25/2017 -Blood cultures 12/23/2017 growing out MSSA - Blood cultures from 12/25/2017 no growth to date, will order P ICC placement today -ID consult -Cardiology consult, no plan for TEE  #) Septic pulmonary emboli: Noted on CT of the chest.  Patient continues to have small volume hemoptysis.  #) Anemia: Stable at this time.  Likely multifactorial including bone marrow suppression from sepsis, possible hemoptysis, anemia of chronic disease, possibly iron deficiency anemia -We will consider sending of iron studies, B12, folate, reticulocyte count, LDH  #) Abdominal pain/diarrhea: CT abdomen pelvis shows no clear etiology for abdominal pain it is now self resolved.Marland Kitchen  #) Left shoulder myositis and septic bursitis: Noted on MRI on 12/25/2017. -Orthopedic surgery does not recommend any aspiration as there is too little fluid  #) L3/4 osteomyelitis, paraspinous phlegmon, right psoas muscle abscess: All likely secondary to endocarditis.  The abscess is not significant sufficiently large for drainage at this time. -Antibiotics per  above -Continue pain control  #) IV drug use:  -Counseling provided -Patient does not want any medication for withdrawal  #) Chronic hepatitis C: -Outpatient treatment  Fluids: Tolerating p.o. Electrodes: Monitor and supplement Nutrition: Regular diet  Prophylaxis: Subcu heparin  Disposition: Unclear at this time however will continue IV antibiotics, will likely need to complete inpatient course  Full code   Consultants:   Infectious disease  Cardiology  Orthopedic surgery  Procedures:  Echo 12/24/2017:- Left ventricle: The cavity size was normal. Wall thickness was   normal. Systolic function was normal. The estimated ejection   fraction was in the range of 55% to 60%. Wall motion was normal;   there were no regional wall motion abnormalities. - Mitral valve: There was trivial regurgitation. - Right atrium: Central venous pressure (est): 3 mm Hg. - Atrial septum: No defect or patent foramen ovale was identified. - Tricuspid valve: Large, homogeneous echodensity associated with   the atrial side of the septal tricuspid leaflet consistent with   vegetation in the setting of documented bacteremia. There was   moderate regurgitation. - Pulmonary arteries: PA peak pressure: 28 mm Hg (S). - Pericardium, extracardiac: A moderate pericardial effusion was   identified posterior to the heart.    Antimicrobials:   IV vancomycin started 12/23/2017 to 12/25/2017  IV aztreonam 12/23/2017 to 12/24/2017  IV cefazolin started 12/25/2017 to ongoing   Subjective: This morning patient reports he is feeling better.  He has been able to tolerate p.o. p.o.  He continues to report back pain.  He denies any nausea, vomiting.  He has had some diarrhea but this now has resolved.  Objective: Vitals:  12/28/17 0545 12/28/17 1442 12/28/17 2117 12/29/17 0537  BP: 118/73 105/63 116/65 122/71  Pulse: 80 78 83 88  Resp: 16 17 16 18   Temp: 98.4 F (36.9 C) 100.1 F (37.8 C) 100 F (37.8  C) (!) 97.4 F (36.3 C)  TempSrc: Oral Oral Oral Oral  SpO2: 97% 98% 94% 98%  Weight:      Height:        Intake/Output Summary (Last 24 hours) at 12/29/2017 0829 Last data filed at 12/29/2017 0552 Gross per 24 hour  Intake 1125.45 ml  Output 1450 ml  Net -324.55 ml   Filed Weights   12/23/17 0931 12/23/17 1627  Weight: 81.6 kg 77.4 kg    Examination:  General exam: Appears calm and comfortable  Respiratory system: No increased work of breathing, no wheezing, no crackles or rhonchi Cardiovascular system: Regular rate and rhythm, 3 out of 6 systolic murmur. Gastrointestinal system: Abdomen is nondistended, soft, non-tender to no rebound or guarding, plus bowel sounds Central nervous system: Alert and oriented.  Bilateral lower extremity strength is 5 out of 5, intact distal sensation, some tenderness over palpation over mid and lower spine Extremities: Trace lower extremity edema, some improvement in abduction left arm Skin: well healed IV scars Psychiatry: Judgement and insight appear normal. Mood & affect appropriate.     Data Reviewed: I have personally reviewed following labs and imaging studies  CBC: Recent Labs  Lab 12/23/17 0950  12/25/17 0428 12/26/17 0501 12/27/17 0450 12/28/17 0648 12/29/17 0457  WBC 15.6*   < > 12.0* 10.9* 10.5 11.4* 10.1  NEUTROABS 13.5*  --   --   --   --   --   --   HGB 12.5*   < > 8.4* 8.5* 8.1* 7.8* 7.8*  HCT 38.8*   < > 26.1* 26.7* 25.4* 24.9* 24.7*  MCV 83.4   < > 85.0 87.5 86.7 86.5 87.0  PLT 162   < > 176 225 271 308 359   < > = values in this interval not displayed.   Basic Metabolic Panel: Recent Labs  Lab 12/23/17 1523  12/25/17 0428 12/26/17 0501 12/27/17 0450 12/28/17 0648 12/29/17 0457  NA  --    < > 136 134* 135 134* 135  K  --    < > 2.7* 3.3* 3.2* 3.5 3.6  CL  --    < > 108 110 109 108 109  CO2  --    < > 20* 16* 18* 19* 20*  GLUCOSE  --    < > 103* 116* 104* 111* 99  BUN  --    < > 16 16 12 11 11    CREATININE  --    < > 1.00 1.06 0.86 0.70 0.80  CALCIUM  --    < > 7.9* 7.7* 7.8* 7.9* 7.9*  MG 2.1  --  1.2* 2.2 1.8  --   --   PHOS  --   --  2.4*  --   --   --   --    < > = values in this interval not displayed.   GFR: Estimated Creatinine Clearance: 139.9 mL/min (by C-G formula based on SCr of 0.8 mg/dL). Liver Function Tests: Recent Labs  Lab 12/23/17 1200 12/26/17 0501 12/27/17 0450  AST 101* 41 53*  ALT 42 23 24  ALKPHOS 143* 158* 156*  BILITOT 1.5* 1.1 0.9  PROT 6.5 6.4* 6.4*  ALBUMIN 2.0* 1.6* 1.5*   No results for input(s): LIPASE,  AMYLASE in the last 168 hours. No results for input(s): AMMONIA in the last 168 hours. Coagulation Profile: No results for input(s): INR, PROTIME in the last 168 hours. Cardiac Enzymes: Recent Labs  Lab 12/26/17 0822  TROPONINI 0.04*   BNP (last 3 results) No results for input(s): PROBNP in the last 8760 hours. HbA1C: No results for input(s): HGBA1C in the last 72 hours. CBG: No results for input(s): GLUCAP in the last 168 hours. Lipid Profile: No results for input(s): CHOL, HDL, LDLCALC, TRIG, CHOLHDL, LDLDIRECT in the last 72 hours. Thyroid Function Tests: No results for input(s): TSH, T4TOTAL, FREET4, T3FREE, THYROIDAB in the last 72 hours. Anemia Panel: No results for input(s): VITAMINB12, FOLATE, FERRITIN, TIBC, IRON, RETICCTPCT in the last 72 hours. Sepsis Labs: Recent Labs  Lab 12/23/17 0981 12/23/17 1431 12/24/17 1841 12/25/17 0428  LATICACIDVEN 4.09* 1.46 2.5* 1.2    Recent Results (from the past 240 hour(s))  Blood Culture (routine x 2)     Status: Abnormal   Collection Time: 12/23/17  9:50 AM  Result Value Ref Range Status   Specimen Description   Final    BLOOD LEFT HAND Performed at [facility], [address], [place], [place] [address]    Special Requests   Final    BOTTLES DRAWN AEROBIC AND ANAEROBIC Blood Culture adequate volume Performed at [facility], [address] [place], [place]  [address]    Culture  Setup Time   Final    GRAM POSITIVE COCCI Gram Stain Report Called to,Read Back By and Verified With: JOHNSON,B. AT 2028 ON 12/23/2017 BY EVA ANAEROBIC BOTTLE ONLY Performed at [facility] CRITICAL RESULT CALLED TO, READ BACK BY AND VERIFIED WITHGean Quint RN 2130 12/24/17 A BROWNING Performed at [facility], [address], [place], [place] [address]    Culture STAPHYLOCOCCUS AUREUS (A)  Final   Report Status 12/26/2017 FINAL  Final   Organism ID, Bacteria STAPHYLOCOCCUS AUREUS  Final      Susceptibility   Staphylococcus aureus - MIC*    CIPROFLOXACIN <=0.5 SENSITIVE Sensitive     ERYTHROMYCIN >=8 RESISTANT Resistant     GENTAMICIN <=0.5 SENSITIVE Sensitive     OXACILLIN 0.5 SENSITIVE Sensitive     TETRACYCLINE <=1 SENSITIVE Sensitive     VANCOMYCIN 1 SENSITIVE Sensitive     TRIMETH/SULFA <=10 SENSITIVE Sensitive     CLINDAMYCIN <=0.25 SENSITIVE Sensitive     RIFAMPIN <=0.5 SENSITIVE Sensitive     Inducible Clindamycin NEGATIVE Sensitive     * STAPHYLOCOCCUS AUREUS  Blood Culture (routine x 2)     Status: Abnormal   Collection Time: 12/23/17  9:50 AM  Result Value Ref Range Status   Specimen Description   Final    BLOOD SITE NOT SPECIFIED Performed at [facility], [address], [place], [place] [address]    Special Requests   Final    BOTTLES DRAWN AEROBIC AND ANAEROBIC Blood Culture results may not be optimal due to an inadequate volume of blood received in culture bottles Performed at [facility], [address] [place], [place] [address]    Culture  Setup Time   Final    GRAM POSITIVE COCCI Gram Stain Report Called to,Read Back By and Verified With: JOHNSON,B. 2129 ON 12/23/2017 BY EVA Performed at [facility] CRITICAL RESULT CALLED TO, READ BACK BY AND VERIFIED WITHGean Quint RN 1324 12/24/17 A BROWNING AEROBIC AND ANAEROBIC BOTTLES Performed at [facility], [address],  [place], [place] [address]    Culture (A)   Final    STAPHYLOCOCCUS AUREUS SUSCEPTIBILITIES PERFORMED ON PREVIOUS CULTURE WITHIN THE LAST 5 DAYS. Performed at [facility], [address], [place], [place] [address]    Report Status 12/26/2017 FINAL  Final  Blood Culture ID Panel (Reflexed)     Status: Abnormal   Collection Time: 12/23/17  9:50 AM  Result Value Ref Range Status   Enterococcus species NOT DETECTED NOT DETECTED Final   Listeria monocytogenes NOT DETECTED NOT DETECTED Final   Staphylococcus species DETECTED (A) NOT DETECTED Final    Comment: CRITICAL RESULT CALLED TO, READ BACK BY AND VERIFIED WITHGean Quint RN [id number] 12/24/17 A BROWNING    Staphylococcus aureus (BCID) DETECTED (A) NOT DETECTED Final    Comment: Methicillin (oxacillin) susceptible Staphylococcus aureus (MSSA). Preferred therapy is anti staphylococcal beta lactam antibiotic (Cefazolin or Nafcillin), unless clinically contraindicated. CRITICAL RESULT CALLED TO, READ BACK BY AND VERIFIED WITHGean Quint RN [phone] 12/24/17 A BROWNING    Methicillin resistance NOT DETECTED NOT DETECTED Final   Streptococcus species NOT DETECTED NOT DETECTED Final   Streptococcus agalactiae NOT DETECTED NOT DETECTED Final   Streptococcus pneumoniae NOT DETECTED NOT DETECTED Final   Streptococcus pyogenes NOT DETECTED NOT DETECTED Final   Acinetobacter baumannii NOT DETECTED NOT DETECTED Final   Enterobacteriaceae species NOT DETECTED NOT DETECTED Final   Enterobacter cloacae complex NOT DETECTED NOT DETECTED Final   Escherichia coli NOT DETECTED NOT DETECTED Final   Klebsiella oxytoca NOT DETECTED NOT DETECTED Final   Klebsiella pneumoniae NOT DETECTED NOT DETECTED Final   Proteus species NOT DETECTED NOT DETECTED Final   Serratia marcescens NOT DETECTED NOT DETECTED Final   Haemophilus influenzae NOT DETECTED NOT DETECTED Final   Neisseria meningitidis NOT DETECTED NOT DETECTED Final   Pseudomonas aeruginosa NOT DETECTED NOT DETECTED Final   Candida albicans NOT  DETECTED NOT DETECTED Final   Candida glabrata NOT DETECTED NOT DETECTED Final   Candida krusei NOT DETECTED NOT DETECTED Final   Candida parapsilosis NOT DETECTED NOT DETECTED Final   Candida tropicalis NOT DETECTED NOT DETECTED Final    Comment: Performed at [facility], [address], [place], [place] [address]  Urine culture     Status: None   Collection Time: 12/23/17  1:00 PM  Result Value Ref Range Status   Specimen Description   Final    URINE, CLEAN CATCH Performed at [facility], [address] [place], [place] [address]    Special Requests   Final    NONE Performed at [facility], [address], [place], [place] [address]    Culture   Final    NO GROWTH Performed at [facility], [address], [place], [place] [address]    Report Status 12/24/2017 FINAL  Final  Culture, blood (routine x 2)     Status: None (Preliminary result)   Collection Time: 12/25/17  8:00 AM  Result Value Ref Range Status   Specimen Description BLOOD RIGHT WRIST  Final   Special Requests   Final    BOTTLES DRAWN AEROBIC ONLY Blood Culture adequate volume   Culture   Final    NO GROWTH 4 DAYS Performed at [facility], [address] [place], [place] [address]    Report Status PENDING  Incomplete  Culture, blood (routine x 2)     Status: None (Preliminary result)   Collection Time: 12/25/17  8:07 AM  Result Value Ref  Range Status   Specimen Description BLOOD LEFT HAND  Final   Special Requests   Final    BOTTLES DRAWN AEROBIC ONLY Blood Culture adequate volume   Culture   Final    NO GROWTH 4 DAYS Performed at [facility], [address] [place], [place] [address]    Report Status PENDING  Incomplete         Radiology Studies: No results found.      Scheduled Meds: . feeding supplement (ENSURE ENLIVE)  237 mL Oral BID BM  . heparin injection (subcutaneous)  5,000 Units Subcutaneous Q8H  . nicotine  21 mg Transdermal Daily  . nystatin  5 mL Oral QID  .  saccharomyces boulardii  250 mg Oral BID  . sodium chloride flush  3 mL Intravenous Q12H   Continuous Infusions: . sodium chloride Stopped (12/28/17 0541)  .  ceFAZolin (ANCEF) IV 2 g (12/29/17 0049)     LOS: 6 days    Time spent: 35    Delaine Lame, MD [facility]  If 7PM-7AM, please contact night-coverage [email or web address] Password [email or web address] 12/29/2017, 8:29 AM

## 2017-12-30 LAB — CBC
HCT: 23.8 % — ABNORMAL LOW (ref 39.0–52.0)
Hemoglobin: 7.5 g/dL — ABNORMAL LOW (ref 13.0–17.0)
MCH: 27.3 pg (ref 26.0–34.0)
MCHC: 31.5 g/dL (ref 30.0–36.0)
MCV: 86.5 fL (ref 80.0–100.0)
Platelets: 345 10*3/uL (ref 150–400)
RBC: 2.75 MIL/uL — ABNORMAL LOW (ref 4.22–5.81)
RDW: 13.2 % (ref 11.5–15.5)
WBC: 9.1 K/uL (ref 4.0–10.5)
nRBC: 0 % (ref 0.0–0.2)

## 2017-12-30 LAB — COMPREHENSIVE METABOLIC PANEL
ALT: 42 U/L (ref 0–44)
AST: 83 U/L — ABNORMAL HIGH (ref 15–41)
Albumin: 1.6 g/dL — ABNORMAL LOW (ref 3.5–5.0)
Alkaline Phosphatase: 215 U/L — ABNORMAL HIGH (ref 38–126)
BUN: 15 mg/dL (ref 6–20)
CO2: 22 mmol/L (ref 22–32)
Calcium: 7.8 mg/dL — ABNORMAL LOW (ref 8.9–10.3)
Creatinine, Ser: 0.73 mg/dL (ref 0.61–1.24)
GFR calc Af Amer: >60 mL/min (ref >60)
Glucose, Bld: 109 mg/dL — ABNORMAL HIGH (ref 70–99)
Sodium: 135 mmol/L (ref 135–145)
Total Bilirubin: 0.7 mg/dL (ref 0.3–1.2)
Total Protein: 6.6 g/dL (ref 6.5–8.1)

## 2017-12-30 LAB — RETICULOCYTES
Immature Retic Fract: 18 % — ABNORMAL HIGH (ref 2.3–15.9)
RBC.: 2.75 MIL/uL — ABNORMAL LOW (ref 4.22–5.81)
Retic Count, Absolute: 69 10*3/uL (ref 19.0–186.0)
Retic Ct Pct: 2.5 % (ref 0.4–3.1)

## 2017-12-30 LAB — COMPREHENSIVE METABOLIC PANEL WITH GFR
Anion gap: 7 (ref 5–15)
Chloride: 106 mmol/L (ref 98–111)
GFR calc non Af Amer: >60 mL/min (ref >60)
Potassium: 4 mmol/L (ref 3.5–5.1)

## 2017-12-30 LAB — FERRITIN: Ferritin: 471 ng/mL — ABNORMAL HIGH (ref 24–336)

## 2017-12-30 LAB — CULTURE, BLOOD (ROUTINE X 2)
Culture: NO GROWTH
Culture: NO GROWTH
Special Requests: ADEQUATE
Special Requests: ADEQUATE

## 2017-12-30 LAB — IRON AND TIBC
Iron: 16 ug/dL — ABNORMAL LOW (ref 45–182)
Saturation Ratios: 10 % — ABNORMAL LOW (ref 17.9–39.5)
TIBC: 154 ug/dL — ABNORMAL LOW (ref 250–450)
UIBC: 138 ug/dL

## 2017-12-30 LAB — VITAMIN B12: Vitamin B-12: 1058 pg/mL — ABNORMAL HIGH (ref 180–914)

## 2017-12-30 LAB — LACTATE DEHYDROGENASE: LDH: 116 U/L (ref 98–192)

## 2017-12-30 MED ORDER — SODIUM CHLORIDE 0.9 % IV SOLN
510.0000 mg | Freq: Once | INTRAVENOUS | Status: AC
  Administered 2017-12-30: 510 mg via INTRAVENOUS
  Filled 2017-12-30: qty 17

## 2017-12-30 NOTE — Care Management (Signed)
Financial counselor has reached out to [place] DSS to verify if patient has Medicaid. Will notify CM with information when received.

## 2017-12-30 NOTE — Care Management Note (Signed)
Case Management Note  Patient Details  Name: Howard Diaz MRN: [id number] Date of Birth: Jun 17, 1985  If discussed at Long Length of Stay Meetings, dates discussed:  12/30/17  Additional Comments:  Malcolm Metro, RN 12/30/2017, 1:20 PM

## 2017-12-30 NOTE — Progress Notes (Signed)
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  Assessment & Plan:   Principal Problem:   Acute osteomyelitis of lumbar spine (HCC) Active Problems:   IV drug user   Hepatitis-C   Lactic acid acidosis   Sepsis (HCC)   Hypokalemia   Tobacco abuse  MSSA tricuspid valve endocarditis: Likely secondary to IV drug use.  Tricuspid valve vegetation noted on echo on 12/24/2017.  Unfortunately the patient is not a candidate for home antibiotics due to his ongoing drug use. - Continue IV cefazolin started 12/25/2017 -Blood cultures 12/23/2017 growing out MSSA - Blood cultures from 12/25/2017 no growth to date,  PICC line placed on 11/11 -ID consult in chart -Cardiology consult, no plan for TEE, since vegetation seen on TTE  Septic pulmonary emboli: Noted on CT of the chest.  Patient continues to have small volume hemoptysis.  Anemia: Stable at this time.  Likely multifactorial including bone marrow suppression from sepsis, anemia of chronic disease and iron deficiency anemia based on lab work -Supplement with Feraheme today due to low iron levels noted.  Abdominal pain/diarrhea: CT abdomen pelvis shows no clear etiology for abdominal pain it is now self resolved..  Left shoulder myositis and septic bursitis: Noted on MRI on 12/25/2017. -Orthopedic surgery does not recommend any aspiration as there is too little fluid  L3/4 osteomyelitis, paraspinous phlegmon, right psoas muscle abscess: All likely secondary to endocarditis.  The abscess is not significant sufficiently large for drainage at this time. -Antibiotics per above -Continue pain  control  IV drug use:  -Counseling provided -Patient does not want any medication for withdrawal  Chronic hepatitis C: -Outpatient treatment   DVT prophylaxis: Heparin Code Status: Full Family Communication: Mother at bedside Disposition Plan: Continue IV Ancef through 12/31 for full 8-week course of treatment as recommended initially by ID.  PICC line has been placed and waiting for Medicaid approval and facility placement.  PT evaluation today.   Consultants:   ID  Cardiology  Orthopedic surgery  Procedures:  Echo 12/24/2017:- Left ventricle: The cavity size was normal. Wall thickness was normal. Systolic function was normal. The estimated ejection fraction was in the range of 55% to 60%. Wall motion was normal; there were no regional wall motion abnormalities. - Mitral valve: There was trivial regurgitation. - Right atrium: Central venous pressure (est): 3 mm Hg. - Atrial septum: No defect or patent foramen ovale was identified. - Tricuspid valve: Large, homogeneous echodensity associated with the atrial side of the septal tricuspid leaflet consistent with vegetation in the setting of documented bacteremia. There was moderate regurgitation. - Pulmonary arteries: PA peak pressure: 28 mm Hg (S). - Pericardium, extracardiac: A moderate pericardial effusion was identified posterior to the heart.  Antimicrobials:   IV vancomycin started 12/23/2017 to 12/25/2017  IV aztreonam 12/23/2017 to 12/24/2017  IV cefazolin started 12/25/2017 to ongoing   Subjective: Patient seen and evaluated today with no new acute complaints or concerns. No acute concerns or events noted overnight.  He would like to get up out of the bed as he is having ongoing back pain and would like PT evaluation.  Objective: Vitals:   12/29/17 1401 12/29/17 2130 12/30/17 [id number]  12/30/17 0817  BP: 133/79 111/66 121/73   Pulse: 78 100 87   Resp: 19     Temp: 99.1 F (37.3 C) (!) 102.5 F  (39.2 C) (!) 101.1 F (38.4 C)   TempSrc: Oral Oral Oral   SpO2: 96% 97% 95% 94%  Weight:      Height:        Intake/Output Summary (Last 24 hours) at 12/30/2017 1311 Last data filed at 12/30/2017 0916 Gross per 24 hour  Intake 418.96 ml  Output 850 ml  Net -431.04 ml   Filed Weights   12/23/17 0931 12/23/17 1627  Weight: 81.6 kg 77.4 kg    Examination:  General exam: Appears calm and comfortable  Respiratory system: Clear to auscultation. Respiratory effort normal. Cardiovascular system: S1 & S2 heard, RRR. No JVD, murmurs, rubs, gallops or clicks. No pedal edema. Gastrointestinal system: Abdomen is nondistended, soft and nontender. No organomegaly or masses felt. Normal bowel sounds heard. Central nervous system: Alert and oriented. No focal neurological deficits. Extremities: Symmetric 5 x 5 power. Skin: No rashes, lesions or ulcers Psychiatry: Judgement and insight appear normal. Mood & affect appropriate.     Data Reviewed: I have personally reviewed following labs and imaging studies  CBC: Recent Labs  Lab 12/26/17 0501 12/27/17 0450 12/28/17 0648 12/29/17 0457 12/30/17 0534  WBC 10.9* 10.5 11.4* 10.1 9.1  HGB 8.5* 8.1* 7.8* 7.8* 7.5*  HCT 26.7* 25.4* 24.9* 24.7* 23.8*  MCV 87.5 86.7 86.5 87.0 86.5  PLT 225 271 308 359 345   Basic Metabolic Panel: Recent Labs  Lab 12/23/17 1523  12/25/17 0428 12/26/17 0501 12/27/17 0450 12/28/17 0648 12/29/17 0457 12/30/17 0534  NA  --    < > 136 134* 135 134* 135 135  K  --    < > 2.7* 3.3* 3.2* 3.5 3.6 4.0  CL  --    < > 108 110 109 108 109 106  CO2  --    < > 20* 16* 18* 19* 20* 22  GLUCOSE  --    < > 103* 116* 104* 111* 99 109*  BUN  --    < > 16 16 12 11 11 15   CREATININE  --    < > 1.00 1.06 0.86 0.70 0.80 0.73  CALCIUM  --    < > 7.9* 7.7* 7.8* 7.9* 7.9* 7.8*  MG 2.1  --  1.2* 2.2 1.8  --   --   --   PHOS  --   --  2.4*  --   --   --   --   --    < > = values in this interval not displayed.    GFR: Estimated Creatinine Clearance: 139.9 mL/min (by C-G formula based on SCr of 0.73 mg/dL). Liver Function Tests: Recent Labs  Lab 12/26/17 0501 12/27/17 0450 12/30/17 0534  AST 41 53* 83*  ALT 23 24 42  ALKPHOS 158* 156* 215*  BILITOT 1.1 0.9 0.7  PROT 6.4* 6.4* 6.6  ALBUMIN 1.6* 1.5* 1.6*   No results for input(s): LIPASE, AMYLASE in the last 168 hours. No results for input(s): AMMONIA in the last 168 hours. Coagulation Profile: No results for input(s): INR, PROTIME in the last 168 hours. Cardiac Enzymes: Recent Labs  Lab 12/26/17 0822  TROPONINI 0.04*   BNP (last 3 results) No results for input(s): PROBNP in the last 8760 hours. HbA1C: No results for input(s): HGBA1C in the last 72 hours. CBG: No results for input(s):  GLUCAP in the last 168 hours. Lipid Profile: No results for input(s): CHOL, HDL, LDLCALC, TRIG, CHOLHDL, LDLDIRECT in the last 72 hours. Thyroid Function Tests: No results for input(s): TSH, T4TOTAL, FREET4, T3FREE, THYROIDAB in the last 72 hours. Anemia Panel: Recent Labs    12/30/17 0534  VITAMINB12 1,058*  FERRITIN 471*  TIBC 154*  IRON 16*  RETICCTPCT 2.5   Sepsis Labs: Recent Labs  Lab 12/23/17 1431 12/24/17 1841 12/25/17 0428  LATICACIDVEN 1.46 2.5* 1.2    Recent Results (from the past 240 hour(s))  Blood Culture (routine x 2)     Status: Abnormal   Collection Time: 12/23/17  9:50 AM  Result Value Ref Range Status   Specimen Description   Final    BLOOD LEFT HAND Performed at [facility], [address] [place], [place] [address]    Special Requests   Final    BOTTLES DRAWN AEROBIC AND ANAEROBIC Blood Culture adequate volume Performed at [facility], [address], [place], [place] [address]    Culture  Setup Time   Final    GRAM POSITIVE COCCI Gram Stain Report Called to,Read Back By and Verified With: JOHNSON,B. AT 2028 ON 12/23/2017 BY EVA ANAEROBIC BOTTLE ONLY Performed at [facility] CRITICAL RESULT  CALLED TO, READ BACK BY AND VERIFIED WITHGean Quint RN 0981 12/24/17 A BROWNING Performed at [facility], [address], [place], [place] [address]    Culture STAPHYLOCOCCUS AUREUS (A)  Final   Report Status 12/26/2017 FINAL  Final   Organism ID, Bacteria STAPHYLOCOCCUS AUREUS  Final      Susceptibility   Staphylococcus aureus - MIC*    CIPROFLOXACIN <=0.5 SENSITIVE Sensitive     ERYTHROMYCIN >=8 RESISTANT Resistant     GENTAMICIN <=0.5 SENSITIVE Sensitive     OXACILLIN 0.5 SENSITIVE Sensitive     TETRACYCLINE <=1 SENSITIVE Sensitive     VANCOMYCIN 1 SENSITIVE Sensitive     TRIMETH/SULFA <=10 SENSITIVE Sensitive     CLINDAMYCIN <=0.25 SENSITIVE Sensitive     RIFAMPIN <=0.5 SENSITIVE Sensitive     Inducible Clindamycin NEGATIVE Sensitive     * STAPHYLOCOCCUS AUREUS  Blood Culture (routine x 2)     Status: Abnormal   Collection Time: 12/23/17  9:50 AM  Result Value Ref Range Status   Specimen Description   Final    BLOOD SITE NOT SPECIFIED Performed at [facility], [address], [place], [place] [address]    Special Requests   Final    BOTTLES DRAWN AEROBIC AND ANAEROBIC Blood Culture results may not be optimal due to an inadequate volume of blood received in culture bottles Performed at [facility], [address], [place], [place] [address]    Culture  Setup Time   Final    GRAM POSITIVE COCCI Gram Stain Report Called to,Read Back By and Verified With: JOHNSON,B. 2129 ON 12/23/2017 BY EVA Performed at [facility] CRITICAL RESULT CALLED TO, READ BACK BY AND VERIFIED WITHGean Quint RN [id number] 12/24/17 A BROWNING AEROBIC AND ANAEROBIC BOTTLES Performed at [facility], [address] [place], [place] [address]    Culture (A)  Final    STAPHYLOCOCCUS AUREUS SUSCEPTIBILITIES PERFORMED ON PREVIOUS CULTURE WITHIN THE LAST 5 DAYS. Performed at [facility], [address], [place], [place] [address]    Report Status 12/26/2017 FINAL  Final  Blood Culture  ID Panel (Reflexed)     Status: Abnormal   Collection Time: 12/23/17  9:50 AM  Result Value Ref Range Status   Enterococcus species NOT DETECTED NOT DETECTED Final   Listeria monocytogenes NOT DETECTED NOT DETECTED Final   Staphylococcus species DETECTED (A) NOT DETECTED Final    Comment: CRITICAL RESULT CALLED TO, READ BACK BY AND VERIFIED WITHGean Quint RN [phone] 12/24/17 A BROWNING    Staphylococcus aureus (BCID) DETECTED (A) NOT DETECTED Final    Comment: Methicillin (oxacillin) susceptible Staphylococcus aureus (MSSA). Preferred therapy is anti staphylococcal beta lactam antibiotic (Cefazolin or Nafcillin), unless clinically contraindicated. CRITICAL RESULT CALLED TO, READ BACK BY AND VERIFIED WITHGean Quint RN [phone] 12/24/17 A BROWNING    Methicillin resistance NOT DETECTED NOT DETECTED Final   Streptococcus species NOT DETECTED NOT DETECTED Final   Streptococcus agalactiae NOT DETECTED NOT DETECTED Final   Streptococcus pneumoniae NOT DETECTED NOT DETECTED Final   Streptococcus pyogenes NOT DETECTED NOT DETECTED Final   Acinetobacter baumannii NOT DETECTED NOT DETECTED Final   Enterobacteriaceae species NOT DETECTED NOT DETECTED Final   Enterobacter cloacae complex NOT DETECTED NOT DETECTED Final   Escherichia coli NOT DETECTED NOT DETECTED Final   Klebsiella oxytoca NOT DETECTED NOT DETECTED Final   Klebsiella pneumoniae NOT DETECTED NOT DETECTED Final   Proteus species NOT DETECTED NOT DETECTED Final   Serratia marcescens NOT DETECTED NOT DETECTED Final   Haemophilus influenzae NOT DETECTED NOT DETECTED Final   Neisseria meningitidis NOT DETECTED NOT DETECTED Final   Pseudomonas aeruginosa NOT DETECTED NOT DETECTED Final   Candida albicans NOT DETECTED NOT DETECTED Final   Candida glabrata NOT DETECTED NOT DETECTED Final   Candida krusei NOT DETECTED NOT DETECTED Final   Candida parapsilosis NOT DETECTED NOT DETECTED Final   Candida tropicalis NOT DETECTED NOT DETECTED Final     Comment: Performed at [facility], [address], [place], [place] [address]  Urine culture     Status: None   Collection Time: 12/23/17  1:00 PM  Result Value Ref Range Status   Specimen Description   Final    URINE, CLEAN CATCH Performed at [facility], [address], [place], [place] [address]    Special Requests   Final    NONE Performed at [facility], [address] [place], [place] [address]    Culture   Final    NO GROWTH Performed at [facility], [address], [place], [place] [address]    Report Status 12/24/2017 FINAL  Final  Culture, blood (routine x 2)     Status: None   Collection Time: 12/25/17  8:00 AM  Result Value Ref Range Status   Specimen Description BLOOD RIGHT WRIST  Final   Special Requests   Final    BOTTLES DRAWN AEROBIC ONLY Blood Culture adequate volume   Culture   Final    NO GROWTH 5 DAYS Performed at [facility], [address], [place], [place] [address]    Report Status 12/30/2017 FINAL  Final  Culture, blood (routine x 2)     Status: None   Collection Time: 12/25/17  8:07 AM  Result Value Ref Range Status   Specimen Description BLOOD LEFT HAND  Final   Special Requests   Final    BOTTLES DRAWN AEROBIC ONLY Blood Culture adequate volume   Culture   Final    NO GROWTH 5 DAYS Performed at [facility], [address], [place], [place] [address]    Report Status 12/30/2017 FINAL  Final  Radiology Studies: No results found.      Scheduled Meds: . feeding supplement (ENSURE ENLIVE)  237 mL Oral BID BM  . heparin injection (subcutaneous)  5,000 Units Subcutaneous Q8H  . nicotine  21 mg Transdermal Daily  . nystatin  5 mL Oral QID  . saccharomyces boulardii  250 mg Oral BID  . sodium chloride flush  10-40 mL Intracatheter Q12H  . sodium chloride flush  3 mL Intravenous Q12H   Continuous Infusions: . sodium chloride 10 mL/hr at 12/29/17 1522  .  ceFAZolin (ANCEF) IV 2 g (12/30/17 0505)     LOS: 7  days    Time spent: 30 minutes    Kacy Conely Hoover Brunette, DO [facility] Pager [phone]  If 7PM-7AM, please contact night-coverage [email or web address] Password [email or web address] 12/30/2017, 1:11 PM

## 2017-12-30 NOTE — Evaluation (Signed)
Physical Therapy Evaluation Patient Details Name: Howard Diaz MRN: [id number] DOB: 04/16/1985 Today's Date: 12/30/2017   History of Present Illness  32 yo male with onset of sepsis after onset of osteomyelitis on lumbar spine from paraspinous abscess.  Has acute hepatic toxicity and lactic acidosis, AKI, new PICC line for ABT.  PMHx:  EtOH abuse, Hepatitis C, IV drug use,   Clinical Impression  Pt is demonstrating ability to stand and step but is limited by increased pain with standing.  He has a back brace at home which may be helpful but has reported being asked not to use one.  He will be seen for strengthening and standing control to increase standing tolerance for gait.  Follow acutely until dc to SNF.    Follow Up Recommendations SNF    Equipment Recommendations  Rolling walker with 5" wheels    Recommendations for Other Services       Precautions / Restrictions Precautions Precautions: Fall Restrictions Weight Bearing Restrictions: No      Mobility  Bed Mobility Overal bed mobility: Needs Assistance Bed Mobility: Supine to Sit;Sit to Supine     Supine to sit: Mod assist Sit to supine: Min assist   General bed mobility comments: assisted trunk OOB and legs with cues for body mechanics back to bed  Transfers Overall transfer level: Needs assistance Equipment used: Rolling walker (2 wheeled);1 person hand held assist Transfers: Sit to/from Stand Sit to Stand: Mod assist;Min assist            Ambulation/Gait Ambulation/Gait assistance: Min assist Gait Distance (Feet): 6 Feet Assistive device: Rolling walker (2 wheeled);1 person hand held assist Gait Pattern/deviations: Step-to pattern;Wide base of support;Trunk flexed Gait velocity: reduced Gait velocity interpretation: <1.8 ft/sec, indicate of risk for recurrent falls General Gait Details: sidesteps side of bed with some fatigue  Stairs            Wheelchair Mobility    Modified Rankin  (Stroke Patients Only)       Balance Overall balance assessment: Needs assistance Sitting-balance support: Feet supported Sitting balance-Leahy Scale: Fair     Standing balance support: Bilateral upper extremity supported Standing balance-Leahy Scale: Poor                               Pertinent Vitals/Pain Pain Assessment: 0-10 Pain Score: 10-Worst pain ever Pain Location: back Pain Descriptors / Indicators: Stabbing;Sharp Pain Intervention(s): Limited activity within patient's tolerance;Monitored during session;Premedicated before session;Repositioned;Patient requesting pain meds-RN notified    Home Living Family/patient expects to be discharged to:: Private residence Living Arrangements: Parent Available Help at Discharge: Family;Available 24 hours/day Type of Home: House Home Access: Stairs to enter Entrance Stairs-Rails: Can reach both;Left;Right Entrance Stairs-Number of Steps: 4 Home Layout: One level Home Equipment: None      Prior Function Level of Independence: Independent         Comments: only recently has been unable to walk     Hand Dominance   Dominant Hand: Right    Extremity/Trunk Assessment   Upper Extremity Assessment Upper Extremity Assessment: Overall WFL for tasks assessed    Lower Extremity Assessment Lower Extremity Assessment: Generalized weakness    Cervical / Trunk Assessment Cervical / Trunk Assessment: Normal;Other exceptions(osteomyelitis Lumbar spine with abscess)  Communication   Communication: No difficulties  Cognition Arousal/Alertness: Awake/alert Behavior During Therapy: Anxious Overall Cognitive Status: Within Functional Limits for tasks assessed  General Comments: pt agitated about movement but is able to stand and take a few steps      General Comments General comments (skin integrity, edema, etc.): pt was seen to asessmoibility but aslo noted 10/10  pain to move but 4/10 pain at rest    Exercises     Assessment/Plan    PT Assessment Patient needs continued PT services  PT Problem List Decreased strength;Decreased range of motion;Decreased activity tolerance;Decreased mobility;Decreased balance;Decreased coordination;Decreased knowledge of use of DME;Decreased safety awareness;Decreased skin integrity;Pain       PT Treatment Interventions DME instruction;Gait training;Stair training;Functional mobility training;Therapeutic activities;Therapeutic exercise;Balance training;Neuromuscular re-education;Patient/family education    PT Goals (Current goals can be found in the Care Plan section)  Acute Rehab PT Goals Patient Stated Goal: to get home PT Goal Formulation: With patient/family Time For Goal Achievement: 01/13/18 Potential to Achieve Goals: Good    Frequency Min 2X/week   Barriers to discharge Inaccessible home environment;Decreased caregiver support home with stairs with his mother    Co-evaluation               AM-PAC PT "6 Clicks" Daily Activity  Outcome Measure Difficulty turning over in bed (including adjusting bedclothes, sheets and blankets)?: Unable Difficulty moving from lying on back to sitting on the side of the bed? : Unable Difficulty sitting down on and standing up from a chair with arms (e.g., wheelchair, bedside commode, etc,.)?: Unable Help needed moving to and from a bed to chair (including a wheelchair)?: A Lot Help needed walking in hospital room?: A Little Help needed climbing 3-5 steps with a railing? : Total 6 Click Score: 9    End of Session Equipment Utilized During Treatment: Gait belt Activity Tolerance: Patient limited by fatigue;Patient limited by pain Patient left: in bed;with call bell/phone within reach;with bed alarm set;with family/visitor present Nurse Communication: Mobility status;Patient requests pain meds PT Visit Diagnosis: Unsteadiness on feet (R26.81);Muscle weakness  (generalized) (M62.81);Ataxic gait (R26.0);Pain Pain - Right/Left: (back) Pain - part of body: (back)    Time: 1234-1300 PT Time Calculation (min) (ACUTE ONLY): 26 min   Charges:   PT Evaluation $PT Eval Moderate Complexity: 1 Mod PT Treatments $Gait Training: 8-22 mins       Ivar Drape 12/30/2017, 9:54 PM   9:56 PM, 12/30/17 Samul Dada, PT, MS Physical Therapist - [place] [phone] (Office)

## 2017-12-31 LAB — BASIC METABOLIC PANEL
Anion gap: 8 (ref 5–15)
BUN: 15 mg/dL (ref 6–20)
CALCIUM: 7.9 mg/dL — AB (ref 8.9–10.3)
CO2: 23 mmol/L (ref 22–32)
Chloride: 105 mmol/L (ref 98–111)
Creatinine, Ser: 0.73 mg/dL (ref 0.61–1.24)
GFR calc Af Amer: >60 mL/min (ref >60)
GFR calc non Af Amer: >60 mL/min (ref >60)
GLUCOSE: 107 mg/dL — AB (ref 70–99)
Potassium: 4.1 mmol/L (ref 3.5–5.1)
Sodium: 136 mmol/L (ref 135–145)

## 2017-12-31 LAB — CBC
HCT: 23 % — ABNORMAL LOW (ref 39.0–52.0)
Hemoglobin: 7.2 g/dL — ABNORMAL LOW (ref 13.0–17.0)
MCH: 27.5 pg (ref 26.0–34.0)
MCHC: 31.3 g/dL (ref 30.0–36.0)
MCV: 87.8 fL (ref 80.0–100.0)
PLATELETS: 359 10*3/uL (ref 150–400)
RBC: 2.62 MIL/uL — ABNORMAL LOW (ref 4.22–5.81)
RDW: 13.2 % (ref 11.5–15.5)
WBC: 9.6 10*3/uL (ref 4.0–10.5)
nRBC: 0 % (ref 0.0–0.2)

## 2017-12-31 LAB — FOLATE RBC
Folate, Hemolysate: 277.2 ng/mL
Folate, RBC: 1238 ng/mL (ref >498)
Hematocrit: 22.4 % — ABNORMAL LOW (ref 37.5–51.0)

## 2017-12-31 NOTE — Progress Notes (Signed)
PT Cancellation Note  Patient Details Name: Howard Diaz MRN: [id number] DOB: 03/04/1985   Cancelled Treatment:    Reason Eval/Treat Not Completed: Pain limiting ability to participate  Attempted PT session.  Pt stated he is limited by high pain scale 10/10 LBP and was recently given morphine via IV for pain control.  Pt left supine in bed with call bell within reach.  [address][name], LPTA; CBIS [phone]  Juel BurrowCockerham, Casey Jo 12/31/2017, 2:48 PM

## 2017-12-31 NOTE — Care Management (Signed)
Call to [place] [employer] [phone]). Patient applied for Medicaid 10/31, application pending.

## 2017-12-31 NOTE — Progress Notes (Signed)
Pharmacy Antibiotic Note  Howard Diaz is a 32 y.o. male admitted on 12/23/2017 with MSSA bacteremia/endocarditis.  Pharmacy has been consulted for cefazolin dosing.  Per patient, he has tolerated keflex in the past despite penicillin allergy. 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 Tricuspid valve vegetation noted on echo on 12/24/2017. Marland Kitchen. BCX from 11/7  negative.   Plan: Continue Cefazolin 2000 mg IV every 8 hours. Monitor labs, c/s, and patient improvement.  Height: 5\' 11"  (180.3 cm) Weight: 170 lb 10.2 oz (77.4 kg) IBW/kg (Calculated) : 75.3  Temp (24hrs), Avg:99.9 F (37.7 C), Min:98.8 F (37.1 C), Max:100.5 F (38.1 C)  Recent Labs  Lab 12/24/17 1841 12/25/17 0428  12/27/17 0450 12/28/17 0648 12/29/17 0457 12/30/17 0534 12/31/17 0532  WBC  --  12.0*   < > 10.5 11.4* 10.1 9.1 9.6  CREATININE  --  1.00   < > 0.86 0.70 0.80 0.73 0.73  LATICACIDVEN 2.5* 1.2  --   --   --   --   --   --    < > = values in this interval not displayed.    Estimated Creatinine Clearance: 139.9 mL/min (by C-G formula based on SCr of 0.73 mg/dL).    Allergies  Allergen Reactions  . Penicillins Anaphylaxis    Has patient had a PCN reaction causing immediate rash, facial/tongue/throat swelling, SOB or lightheadedness with hypotension: Yes Has patient had a PCN reaction causing severe rash involving mucus membranes or skin necrosis: Yes Has patient had a PCN reaction that required hospitalization:Yes Has patient had a PCN reaction occurring within the last 10 years: No If all of the above answers are "NO", then may proceed with Cephalosporin use.     Antimicrobials this admission: Vanco 11/5 >> 11/7 Aztreonam 11/5 >> 11/6 Levaquin 11/5 >>11/5 Cefazolin 11/7 >>  Microbiology results: 11/7 BXx: ng 11/5 BCx: MSSA 11/5 UCx: ng  Thank you for allowing pharmacy to be a part of this patient's care.  Judeth CornfieldSteven Brelee Renk, PharmD Clinical Pharmacist 12/31/2017 8:34  AM

## 2017-12-31 NOTE — Progress Notes (Signed)
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  Assessment & Plan:   Principal Problem:   Acute osteomyelitis of lumbar spine (HCC) Active Problems:   IV drug user   Hepatitis-C   Lactic acid acidosis   Sepsis (HCC)   Hypokalemia   Tobacco abuse  MSSA tricuspid valve endocarditis: Likely secondary to IV drug use.  Tricuspid valve vegetation noted on echo on 12/24/2017.  Unfortunately the patient is not a candidate for home antibiotics due to his ongoing drug use. - Continue IV cefazolin started 12/25/2017 to continue thru 02/17/18.  -Blood cultures 12/23/2017 growing out MSSA - Blood cultures from 12/25/2017 no growth to date,  PICC line placed on 11/11 -ID consult in chart -Cardiology consult, no plan for TEE, since vegetation seen on TTE  Septic pulmonary emboli: Noted on CT of the chest.  Patient continues to have small volume hemoptysis.  Anemia: Stable at this time.  Likely multifactorial including bone marrow suppression from sepsis, anemia of chronic disease and iron deficiency anemia based on lab work -Supplement with Feraheme today due to low iron levels noted.  Abdominal pain/diarrhea: CT abdomen pelvis shows no clear etiology for abdominal pain it is now self resolved..  Left shoulder myositis and septic bursitis: Noted on MRI on 12/25/2017. -Orthopedic surgery does not recommend any aspiration as there is too little fluid  L3/4 osteomyelitis, paraspinous phlegmon, right psoas muscle abscess: All likely secondary to endocarditis.  The abscess is not significant sufficiently large for drainage at this time. -Antibiotics  per above -Continue pain control  IV drug use:  -Counseling provided -Patient does not want any medication for withdrawal  Chronic hepatitis C: -Outpatient treatment   DVT prophylaxis: Heparin Code Status: Full Family Communication: Mother at bedside Disposition Plan: Continue IV Ancef through 12/31 for full 8-week course of treatment as recommended initially by ID.  PICC line has been placed and waiting for Medicaid approval and facility placement.  PT evaluation pending.    Consultants:   ID  Cardiology  Orthopedic surgery  Procedures:  Echo 12/24/2017:- Left ventricle: The cavity size was normal. Wall thickness was normal. Systolic function was normal. The estimated ejection fraction was in the range of 55% to 60%. Wall motion was normal; there were no regional wall motion abnormalities. - Mitral valve: There was trivial regurgitation. - Right atrium: Central venous pressure (est): 3 mm Hg. - Atrial septum: No defect or patent foramen ovale was identified. - Tricuspid valve: Large, homogeneous echodensity associated with the atrial side of the septal tricuspid leaflet consistent with vegetation in the setting of documented bacteremia. There was moderate regurgitation. - Pulmonary arteries: PA peak pressure: 28 mm Hg (S). - Pericardium, extracardiac: A moderate pericardial effusion was identified posterior to the heart.  Antimicrobials:   IV vancomycin started 12/23/2017 to 12/25/2017  IV aztreonam 12/23/2017 to 12/24/2017  IV cefazolin started 12/25/2017 to ongoing   Subjective: Patient reports continued back pain and sweating.  No chills.  He wants to ambulate more.    Objective: Vitals:   12/30/17 1327 12/30/17 2123 12/31/17 0552 12/31/17 0817  BP: 124/66 115/62 119/80   Pulse: 78 89 73   Resp: 18  Temp: (!) 100.4 F (38 C) (!) 100.5 F (38.1 C) 98.8 F (37.1 C)   TempSrc: Oral Oral Oral   SpO2: 98% 94% 95% 96%  Weight:      Height:         Intake/Output Summary (Last 24 hours) at 12/31/2017 1108 Last data filed at 12/31/2017 0914 Gross per 24 hour  Intake 1236.01 ml  Output 500 ml  Net 736.01 ml   Filed Weights   12/23/17 0931 12/23/17 1627  Weight: 81.6 kg 77.4 kg    Examination:  General exam: Appears calm and comfortable  Respiratory system: Clear to auscultation. Respiratory effort normal. Cardiovascular system: S1 & S2 heard, RRR. No JVD. No pedal edema. Gastrointestinal system: Abdomen is nondistended, soft and nontender. No organomegaly or masses felt. Normal bowel sounds heard. Central nervous system: Alert and oriented. No focal neurological deficits. Extremities: Symmetric 5 x 5 power. Skin: No rashes, lesions or ulcers Psychiatry: Judgement and insight appear normal. Mood & affect appropriate.   Data Reviewed: I have personally reviewed following labs and imaging studies  CBC: Recent Labs  Lab 12/27/17 0450 12/28/17 0648 12/29/17 0457 12/30/17 0534 12/31/17 0532  WBC 10.5 11.4* 10.1 9.1 9.6  HGB 8.1* 7.8* 7.8* 7.5* 7.2*  HCT 25.4* 24.9* 24.7* 23.8* 23.0*  MCV 86.7 86.5 87.0 86.5 87.8  PLT 271 308 359 345 359   Basic Metabolic Panel: Recent Labs  Lab 12/25/17 0428 12/26/17 0501 12/27/17 0450 12/28/17 0648 12/29/17 0457 12/30/17 0534 12/31/17 0532  NA 136 134* 135 134* 135 135 136  K 2.7* 3.3* 3.2* 3.5 3.6 4.0 4.1  CL 108 110 109 108 109 106 105  CO2 20* 16* 18* 19* 20* 22 23  GLUCOSE 103* 116* 104* 111* 99 109* 107*  BUN 16 16 12 11 11 15 15   CREATININE 1.00 1.06 0.86 0.70 0.80 0.73 0.73  CALCIUM 7.9* 7.7* 7.8* 7.9* 7.9* 7.8* 7.9*  MG 1.2* 2.2 1.8  --   --   --   --   PHOS 2.4*  --   --   --   --   --   --    GFR: Estimated Creatinine Clearance: 139.9 mL/min (by C-G formula based on SCr of 0.73 mg/dL). Liver Function Tests: Recent Labs  Lab 12/26/17 0501 12/27/17 0450 12/30/17 0534  AST 41 53* 83*  ALT 23 24 42  ALKPHOS 158* 156* 215*  BILITOT 1.1 0.9 0.7  PROT  6.4* 6.4* 6.6  ALBUMIN 1.6* 1.5* 1.6*   No results for input(s): LIPASE, AMYLASE in the last 168 hours. No results for input(s): AMMONIA in the last 168 hours. Coagulation Profile: No results for input(s): INR, PROTIME in the last 168 hours. Cardiac Enzymes: Recent Labs  Lab 12/26/17 0822  TROPONINI 0.04*   BNP (last 3 results) No results for input(s): PROBNP in the last 8760 hours. HbA1C: No results for input(s): HGBA1C in the last 72 hours. CBG: No results for input(s): GLUCAP in the last 168 hours. Lipid Profile: No results for input(s): CHOL, HDL, LDLCALC, TRIG, CHOLHDL, LDLDIRECT in the last 72 hours. Thyroid Function Tests: No results for input(s): TSH, T4TOTAL, FREET4, T3FREE, THYROIDAB in the last 72 hours. Anemia Panel: Recent Labs    12/30/17 0534  VITAMINB12 1,058*  FERRITIN 471*  TIBC 154*  IRON 16*  RETICCTPCT 2.5   Sepsis Labs: Recent Labs  Lab 12/24/17 1841 12/25/17 0428  LATICACIDVEN 2.5* 1.2    Recent Results (from the past 240 hour(s))  Blood  Culture (routine x 2)     Status: Abnormal   Collection Time: 12/23/17  9:50 AM  Result Value Ref Range Status   Specimen Description   Final    BLOOD LEFT HAND Performed at [facility], [address] [place], [place] [address]    Special Requests   Final    BOTTLES DRAWN AEROBIC AND ANAEROBIC Blood Culture adequate volume Performed at [facility], [address], [place], [place] [address]    Culture  Setup Time   Final    GRAM POSITIVE COCCI Gram Stain Report Called to,Read Back By and Verified With: Hence Derrick,B. AT 2028 ON 12/23/2017 BY EVA ANAEROBIC BOTTLE ONLY Performed at [facility] CRITICAL RESULT CALLED TO, READ BACK BY AND VERIFIED WITHGean Quint RN 0981 12/24/17 A BROWNING Performed at [facility], [address], [place], [place] [address]    Culture STAPHYLOCOCCUS AUREUS (A)  Final   Report Status 12/26/2017 FINAL  Final   Organism ID, Bacteria STAPHYLOCOCCUS AUREUS  Final       Susceptibility   Staphylococcus aureus - MIC*    CIPROFLOXACIN <=0.5 SENSITIVE Sensitive     ERYTHROMYCIN >=8 RESISTANT Resistant     GENTAMICIN <=0.5 SENSITIVE Sensitive     OXACILLIN 0.5 SENSITIVE Sensitive     TETRACYCLINE <=1 SENSITIVE Sensitive     VANCOMYCIN 1 SENSITIVE Sensitive     TRIMETH/SULFA <=10 SENSITIVE Sensitive     CLINDAMYCIN <=0.25 SENSITIVE Sensitive     RIFAMPIN <=0.5 SENSITIVE Sensitive     Inducible Clindamycin NEGATIVE Sensitive     * STAPHYLOCOCCUS AUREUS  Blood Culture (routine x 2)     Status: Abnormal   Collection Time: 12/23/17  9:50 AM  Result Value Ref Range Status   Specimen Description   Final    BLOOD SITE NOT SPECIFIED Performed at [facility], [address] [place], [place] [address]    Special Requests   Final    BOTTLES DRAWN AEROBIC AND ANAEROBIC Blood Culture results may not be optimal due to an inadequate volume of blood received in culture bottles Performed at [facility], [address], [place], [place] [address]    Culture  Setup Time   Final    GRAM POSITIVE COCCI Gram Stain Report Called to,Read Back By and Verified With: Namiko Pritts,B. 2129 ON 12/23/2017 BY EVA Performed at [facility] CRITICAL RESULT CALLED TO, READ BACK BY AND VERIFIED WITHGean Quint RN [id number] 12/24/17 A BROWNING AEROBIC AND ANAEROBIC BOTTLES Performed at [facility], [address], [place], [place] [address]    Culture (A)  Final    STAPHYLOCOCCUS AUREUS SUSCEPTIBILITIES PERFORMED ON PREVIOUS CULTURE WITHIN THE LAST 5 DAYS. Performed at [facility], [address] [place], [place] [address]    Report Status 12/26/2017 FINAL  Final  Blood Culture ID Panel (Reflexed)     Status: Abnormal   Collection Time: 12/23/17  9:50 AM  Result Value Ref Range Status   Enterococcus species NOT DETECTED NOT DETECTED Final   Listeria monocytogenes NOT DETECTED NOT DETECTED Final   Staphylococcus species DETECTED (A) NOT DETECTED Final    Comment:  CRITICAL RESULT CALLED TO, READ BACK BY AND VERIFIED WITHGean Quint RN 1324 12/24/17 A BROWNING    Staphylococcus aureus (BCID) DETECTED (A) NOT DETECTED Final    Comment: Methicillin (oxacillin) susceptible Staphylococcus aureus (MSSA). Preferred therapy is anti staphylococcal beta lactam antibiotic (Cefazolin or Nafcillin), unless clinically contraindicated. CRITICAL RESULT CALLED TO,  READ BACK BY AND VERIFIED WITHGean Quint RN [phone] 12/24/17 A BROWNING    Methicillin resistance NOT DETECTED NOT DETECTED Final   Streptococcus species NOT DETECTED NOT DETECTED Final   Streptococcus agalactiae NOT DETECTED NOT DETECTED Final   Streptococcus pneumoniae NOT DETECTED NOT DETECTED Final   Streptococcus pyogenes NOT DETECTED NOT DETECTED Final   Acinetobacter baumannii NOT DETECTED NOT DETECTED Final   Enterobacteriaceae species NOT DETECTED NOT DETECTED Final   Enterobacter cloacae complex NOT DETECTED NOT DETECTED Final   Escherichia coli NOT DETECTED NOT DETECTED Final   Klebsiella oxytoca NOT DETECTED NOT DETECTED Final   Klebsiella pneumoniae NOT DETECTED NOT DETECTED Final   Proteus species NOT DETECTED NOT DETECTED Final   Serratia marcescens NOT DETECTED NOT DETECTED Final   Haemophilus influenzae NOT DETECTED NOT DETECTED Final   Neisseria meningitidis NOT DETECTED NOT DETECTED Final   Pseudomonas aeruginosa NOT DETECTED NOT DETECTED Final   Candida albicans NOT DETECTED NOT DETECTED Final   Candida glabrata NOT DETECTED NOT DETECTED Final   Candida krusei NOT DETECTED NOT DETECTED Final   Candida parapsilosis NOT DETECTED NOT DETECTED Final   Candida tropicalis NOT DETECTED NOT DETECTED Final    Comment: Performed at [facility], [address] [place], [place] [address]  Urine culture     Status: None   Collection Time: 12/23/17  1:00 PM  Result Value Ref Range Status   Specimen Description   Final    URINE, CLEAN CATCH Performed at [facility], [address],  [place], [place] [address]    Special Requests   Final    NONE Performed at [facility], [address], [place], [place] [address]    Culture   Final    NO GROWTH Performed at [facility], [address] [place], [place] [address]    Report Status 12/24/2017 FINAL  Final  Culture, blood (routine x 2)     Status: None   Collection Time: 12/25/17  8:00 AM  Result Value Ref Range Status   Specimen Description BLOOD RIGHT WRIST  Final   Special Requests   Final    BOTTLES DRAWN AEROBIC ONLY Blood Culture adequate volume   Culture   Final    NO GROWTH 5 DAYS Performed at [facility], [address], [place], [place] [address]    Report Status 12/30/2017 FINAL  Final  Culture, blood (routine x 2)     Status: None   Collection Time: 12/25/17  8:07 AM  Result Value Ref Range Status   Specimen Description BLOOD LEFT HAND  Final   Special Requests   Final    BOTTLES DRAWN AEROBIC ONLY Blood Culture adequate volume   Culture   Final    NO GROWTH 5 DAYS Performed at [facility], [address], [place], [place] [address]    Report Status 12/30/2017 FINAL  Final    Radiology Studies: No results found.  Scheduled Meds: . feeding supplement (ENSURE ENLIVE)  237 mL Oral BID BM  . heparin injection (subcutaneous)  5,000 Units Subcutaneous Q8H  . nicotine  21 mg Transdermal Daily  . nystatin  5 mL Oral QID  . saccharomyces boulardii  250 mg Oral BID  . sodium chloride flush  10-40 mL Intracatheter Q12H  . sodium chloride flush  3 mL Intravenous Q12H   Continuous Infusions: . sodium chloride 250 mL (12/30/17 1432)  .  ceFAZolin (ANCEF) IV 2 g (12/31/17 0549)  LOS: 8 days   Time spent: 25 minutes  Standley Dakins, MD [facility] Pager [phone]  If 7PM-7AM, please contact night-coverage [email or web address] Password [email or web address] 12/31/2017, 11:08 AM

## 2018-01-01 MED ORDER — PRO-STAT SUGAR FREE PO LIQD
30.0000 mL | Freq: Three times a day (TID) | ORAL | Status: DC
  Administered 2018-01-01 – 2018-01-20 (×55): 30 mL via ORAL
  Filled 2018-01-01 (×56): qty 30

## 2018-01-01 MED ORDER — SENNOSIDES-DOCUSATE SODIUM 8.6-50 MG PO TABS
1.0000 | ORAL_TABLET | Freq: Every day | ORAL | Status: DC
  Administered 2018-01-01 – 2018-01-02 (×2): 1 via ORAL
  Filled 2018-01-01 (×4): qty 1

## 2018-01-01 MED ORDER — ENSURE ENLIVE PO LIQD
237.0000 mL | Freq: Four times a day (QID) | ORAL | Status: DC
  Administered 2018-01-01 – 2018-01-21 (×62): 237 mL via ORAL

## 2018-01-01 NOTE — Progress Notes (Addendum)
Initial Nutrition Assessment  DOCUMENTATION CODES:   Severe malnutrition in context of acute illness/injury  INTERVENTION:  Ensure Enlive po QID, each supplement provides 350 kcal and 20 grams of protein   Provide 4 oz ice cream with Ensure as patient desires.   ProStat 30 ml TID (each 30 ml provides 100 kcal, 15 gr protein)   Recommend consider Vitamin C- 500 mg daily    NUTRITION DIAGNOSIS:   Severe Malnutrition related to acute illness, poor appetite, wound healing(lumbar osteomyelitits, endocarditis) as evidenced by estimated needs, percent weight loss, per patient/family report, energy intake < or equal to 50% for > or equal to 5 days, wt loss 5% in less than 1 month.  GOAL:  Patient will meet greater than or equal to 90% of their needs  MONITOR:   PO intake, Supplement acceptance, Labs, Weight trends, Skin  REASON FOR ASSESSMENT:   Malnutrition Screening Tool    ASSESSMENT:  Patient is a 32 yo male with hx of ETOH abuse, Hepatitis C and IV drug and tobacco abuse. He presented to ED 11/5 and found to be septic with acute lumbar osteomyelitis (confirmed by MRI). CRP elevated-29.6,  Phosphorus 2.4- low.  At bedside mother is present. Patient complains of poor appetite prior to admission and since admission (LOS-9 days) meal intake sparse 0-25%. He is easily consuming the Ensure Enlive at least 3 daily (1050 kcal, 60 gr protein) which meets < 50% of his needs.   His weight is down approximately 10 lb (4.5 kg) 5% in < 1 month- acute onset of illness. Last wt obtained 11/5. Discussed with RN who is obtaining a follow up standing wt on patient for assessment of further acute loss.    Labs: 11/12- Albumin 1.6 (L) , B12-1058 (H) , Folate 277 (WNL) BMP Latest Ref Rng & Units 12/31/2017 12/30/2017 12/29/2017  Glucose 70 - 99 mg/dL 782(N107(H) 562(Z109(H) 99  BUN 6 - 20 mg/dL 15 15 11   Creatinine 0.61 - 1.24 mg/dL 3.080.73 6.570.73 8.460.80  Sodium 135 - 145 mmol/L 136 135 135  Potassium 3.5 - 5.1  mmol/L 4.1 4.0 3.6  Chloride 98 - 111 mmol/L 105 106 109  CO2 22 - 32 mmol/L 23 22 20(L)  Calcium 8.9 - 10.3 mg/dL 7.9(L) 7.8(L) 7.9(L)     NUTRITION - FOCUSED PHYSICAL EXAM:  Diet Order:   Diet Order            Diet regular Room service appropriate? Yes; Fluid consistency: Thin  Diet effective now             EDUCATION NEEDS:   Education needs have been addressed Skin:  Skin Assessment: Reviewed RN Assessment  Last BM:  11/12  Height:   Ht Readings from Last 1 Encounters:  12/23/17 5\' 11"  (1.803 m)    Weight:   Wt Readings from Last 1 Encounters:  12/23/17 77.4 kg    Ideal Body Weight:  78 kg  BMI:  Body mass index is 23.8 kg/m.  Estimated Nutritional Needs:   Kcal:  [id number] (33-35 kcal/kg/bw)  Protein:  108-123 gr (1.4-1.6 gr/kg/bw)  Fluid:  2.5-2.7 liters daily   Royann ShiversLynn Pessy Delamar MS,RD,CSG,LDN Office: [phone] Pager: [phone]

## 2018-01-01 NOTE — Progress Notes (Signed)
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  Assessment & Plan:   Principal Problem:   Acute osteomyelitis of lumbar spine (HCC) Active Problems:   IV drug user   Hepatitis-C   Lactic acid acidosis   Sepsis (HCC)   Hypokalemia   Tobacco abuse  MSSA tricuspid valve endocarditis: Likely secondary to IV drug use.  Tricuspid valve vegetation noted on echo on 12/24/2017.  Unfortunately the patient is not a candidate for home antibiotics due to his ongoing drug use. - Continue IV cefazolin started 12/25/2017 to continue thru 02/17/18.  -Blood cultures 12/23/2017 growing out MSSA - Blood cultures from 12/25/2017 no growth to date,  PICC line placed on 11/11 -ID consult in chart -Cardiology consulted, no plan for TEE, since vegetation seen on TTE  Septic pulmonary emboli: Noted on CT of the chest.  Patient continues to have small volume hemoptysis.  Anemia: Stable at this time.  Likely multifactorial including bone marrow suppression from sepsis, anemia of chronic disease and iron deficiency anemia based on lab work -Supplement with Feraheme today due to low iron levels noted.  Abdominal pain/diarrhea: CT abdomen pelvis shows no clear etiology for abdominal pain it is now self resolved..  Left shoulder myositis and septic bursitis: Noted on MRI on 12/25/2017. -Orthopedic surgery does not recommend any aspiration as there is too little fluid  L3/4 osteomyelitis, paraspinous phlegmon, right psoas muscle abscess: All likely secondary to endocarditis.  The abscess is not significant sufficiently large for drainage at this time. -Antibiotics per  above -Continue pain control  IV drug use:  -Counseling provided -Patient does not want any medication for withdrawal  Chronic hepatitis C: -Outpatient treatment   DVT prophylaxis: Heparin Code Status: Full Family Communication: Mother at bedside Disposition Plan: Continue IV Ancef through 12/31 for full 8-week course of treatment as recommended initially by ID.  PICC line has been placed and waiting for Medicaid approval and facility placement.     Consultants:   ID  Cardiology  Orthopedic surgery  Procedures:  Echo 12/24/2017:- Left ventricle: The cavity size was normal. Wall thickness was normal. Systolic function was normal. The estimated ejection fraction was in the range of 55% to 60%. Wall motion was normal; there were no regional wall motion abnormalities. - Mitral valve: There was trivial regurgitation. - Right atrium: Central venous pressure (est): 3 mm Hg. - Atrial septum: No defect or patent foramen ovale was identified. - Tricuspid valve: Large, homogeneous echodensity associated with the atrial side of the septal tricuspid leaflet consistent with vegetation in the setting of documented bacteremia. There was moderate regurgitation. - Pulmonary arteries: PA peak pressure: 28 mm Hg (S). - Pericardium, extracardiac: A moderate pericardial effusion was identified posterior to the heart.  Antimicrobials:   IV vancomycin started 12/23/2017 to 12/25/2017  IV aztreonam 12/23/2017 to 12/24/2017  IV cefazolin started 12/25/2017 to ongoing   Subjective: Patient reports continued back pain especially at night.     Objective: Vitals:   12/31/17 2002 12/31/17 2101 01/01/18 0526 01/01/18 0825  BP:  118/73 125/81   Pulse:  69 62   Resp:  (!) 22 16   Temp:  98.1 F (36.7 C) 97.9 F (36.6  C)   TempSrc:  Oral Oral   SpO2: 94% 97% 97% 96%  Weight:      Height:        Intake/Output Summary (Last 24 hours) at 01/01/2018 1108 Last data filed at  01/01/2018 0900 Gross per 24 hour  Intake 720 ml  Output 1150 ml  Net -430 ml   Filed Weights   12/23/17 0931 12/23/17 1627  Weight: 81.6 kg 77.4 kg    Examination:  General exam: Appears calm and comfortable  Respiratory system: Clear to auscultation. Respiratory effort normal. Cardiovascular system: S1 & S2 heard, RRR. No JVD. No pedal edema. Gastrointestinal system: Abdomen is nondistended, soft and nontender. No organomegaly or masses felt. Normal bowel sounds heard. Central nervous system: Alert and oriented. No focal neurological deficits. Extremities: Symmetric 5 x 5 power. Skin: No rashes, lesions or ulcers Psychiatry: Judgement and insight appear normal. Mood & affect appropriate.   Data Reviewed: I have personally reviewed following labs and imaging studies  CBC: Recent Labs  Lab 12/27/17 0450 12/28/17 0648 12/29/17 0457 12/30/17 0534 12/31/17 0532  WBC 10.5 11.4* 10.1 9.1 9.6  HGB 8.1* 7.8* 7.8* 7.5* 7.2*  HCT 25.4* 24.9* 24.7* 23.8*  22.4* 23.0*  MCV 86.7 86.5 87.0 86.5 87.8  PLT 271 308 359 345 359   Basic Metabolic Panel: Recent Labs  Lab 12/26/17 0501 12/27/17 0450 12/28/17 0648 12/29/17 0457 12/30/17 0534 12/31/17 0532  NA 134* 135 134* 135 135 136  K 3.3* 3.2* 3.5 3.6 4.0 4.1  CL 110 109 108 109 106 105  CO2 16* 18* 19* 20* 22 23  GLUCOSE 116* 104* 111* 99 109* 107*  BUN 16 12 11 11 15 15   CREATININE 1.06 0.86 0.70 0.80 0.73 0.73  CALCIUM 7.7* 7.8* 7.9* 7.9* 7.8* 7.9*  MG 2.2 1.8  --   --   --   --    GFR: Estimated Creatinine Clearance: 139.9 mL/min (by C-G formula based on SCr of 0.73 mg/dL). Liver Function Tests: Recent Labs  Lab 12/26/17 0501 12/27/17 0450 12/30/17 0534  AST 41 53* 83*  ALT 23 24 42  ALKPHOS 158* 156* 215*  BILITOT 1.1 0.9 0.7  PROT 6.4* 6.4* 6.6  ALBUMIN 1.6* 1.5* 1.6*   No results for input(s): LIPASE, AMYLASE in the last 168 hours. No results for input(s): AMMONIA in the last 168 hours. Coagulation  Profile: No results for input(s): INR, PROTIME in the last 168 hours. Cardiac Enzymes: Recent Labs  Lab 12/26/17 0822  TROPONINI 0.04*   BNP (last 3 results) No results for input(s): PROBNP in the last 8760 hours. HbA1C: No results for input(s): HGBA1C in the last 72 hours. CBG: No results for input(s): GLUCAP in the last 168 hours. Lipid Profile: No results for input(s): CHOL, HDL, LDLCALC, TRIG, CHOLHDL, LDLDIRECT in the last 72 hours. Thyroid Function Tests: No results for input(s): TSH, T4TOTAL, FREET4, T3FREE, THYROIDAB in the last 72 hours. Anemia Panel: Recent Labs    12/30/17 0534  VITAMINB12 1,058*  FERRITIN 471*  TIBC 154*  IRON 16*  RETICCTPCT 2.5   Sepsis Labs: No results for input(s): PROCALCITON, LATICACIDVEN in the last 168 hours.  Recent Results (from the past 240 hour(s))  Blood Culture (routine x 2)     Status: Abnormal   Collection Time: 12/23/17  9:50 AM  Result Value Ref Range Status   Specimen Description   Final    BLOOD LEFT HAND Performed at [facility], [address] [place], [place]  [address]    Special Requests   Final    BOTTLES DRAWN AEROBIC AND ANAEROBIC Blood Culture adequate volume Performed at [facility] [address] [place], [place] [address]    Culture  Setup Time   Final    GRAM POSITIVE COCCI Gram Stain Report Called to,Read Back By and Verified With: Audrea Bolte,B. AT 2028 ON 12/23/2017 BY EVA ANAEROBIC BOTTLE ONLY Performed at [facility] CRITICAL RESULT CALLED TO, READ BACK BY AND VERIFIED WITHGean Quint RN [id number] 12/24/17 A [place]OWN[place] Performed at [facility], [address], [place], [place] [address]    Culture STAPHYLOCOCCUS AUREUS (A)  Final   Report Status 12/26/2017 FINAL  Final   Organism ID, Bacteria STAPHYLOCOCCUS AUREUS  Final      Susceptibility   Staphylococcus aureus - MIC*    CIPROFLOXACIN <=0.5 SENSITIVE Sensitive     ERYTHROMYCIN >=8 RESISTANT Resistant     GENTAMICIN <=0.5 SENSITIVE  Sensitive     OXACILLIN 0.5 SENSITIVE Sensitive     TETRACYCLINE <=1 SENSITIVE Sensitive     VANCOMYCIN 1 SENSITIVE Sensitive     TRIMETH/SULFA <=10 SENSITIVE Sensitive     CLINDAMYCIN <=0.25 SENSITIVE Sensitive     RIFAMPIN <=0.5 SENSITIVE Sensitive     Inducible Clindamycin NEGATIVE Sensitive     * STAPHYLOCOCCUS AUREUS  Blood Culture (routine x 2)     Status: Abnormal   Collection Time: 12/23/17  9:50 AM  Result Value Ref Range Status   Specimen Description   Final    BLOOD SITE NOT SPECIFIED Performed at [facility], [address], [place], [place] [address]    Special Requests   Final    BOTTLES DRAWN AEROBIC AND ANAEROBIC Blood Culture results may not be optimal due to an inadequate volume of blood received in culture bottles Performed at [facility], [address], [place], [place] [address]    Culture  Setup Time   Final    GRAM POSITIVE COCCI Gram Stain Report Called to,Read Back By and Verified With: Chetara Kropp,B. 2129 ON 12/23/2017 BY EVA Performed at [facility] CRITICAL RESULT CALLED TO, READ BACK BY AND VERIFIED WITHGean Quint RN [id number] 12/24/17 A [place]OWNING AEROBIC AND ANAEROBIC BOTTLES Performed at [facility], [address] [place], [place] [address]    Culture (A)  Final    STAPHYLOCOCCUS AUREUS SUSCEPTIBILITIES PERFORMED ON PREVIOUS CULTURE WITHIN THE LAST 5 DAYS. Performed at [facility], [address] [place], [place] [address]    Report Status 12/26/2017 FINAL  Final  Blood Culture ID Panel (Reflexed)     Status: Abnormal   Collection Time: 12/23/17  9:50 AM  Result Value Ref Range Status   Enterococcus species NOT DETECTED NOT DETECTED Final   Listeria monocytogenes NOT DETECTED NOT DETECTED Final   Staphylococcus species DETECTED (A) NOT DETECTED Final    Comment: CRITICAL RESULT CALLED TO, READ BACK BY AND VERIFIED WITHGean Quint RN 1324 12/24/17 A BROWNING    Staphylococcus aureus (BCID) DETECTED (A) NOT DETECTED Final     Comment: Methicillin (oxacillin) susceptible Staphylococcus aureus (MSSA). Preferred therapy is anti staphylococcal beta lactam antibiotic (Cefazolin or Nafcillin), unless clinically contraindicated. CRITICAL RESULT CALLED TO, READ BACK BY AND VERIFIED WITHGean Quint RN [id number] 12/24/17 A BROWNING    Methicillin resistance NOT DETECTED NOT DETECTED Final   Streptococcus species NOT DETECTED NOT DETECTED Final   Streptococcus agalactiae NOT DETECTED NOT DETECTED Final   Streptococcus pneumoniae NOT DETECTED  NOT DETECTED Final   Streptococcus pyogenes NOT DETECTED NOT DETECTED Final   Acinetobacter baumannii NOT DETECTED NOT DETECTED Final   Enterobacteriaceae species NOT DETECTED NOT DETECTED Final   Enterobacter cloacae complex NOT DETECTED NOT DETECTED Final   Escherichia coli NOT DETECTED NOT DETECTED Final   Klebsiella oxytoca NOT DETECTED NOT DETECTED Final   Klebsiella pneumoniae NOT DETECTED NOT DETECTED Final   Proteus species NOT DETECTED NOT DETECTED Final   Serratia marcescens NOT DETECTED NOT DETECTED Final   Haemophilus influenzae NOT DETECTED NOT DETECTED Final   Neisseria meningitidis NOT DETECTED NOT DETECTED Final   Pseudomonas aeruginosa NOT DETECTED NOT DETECTED Final   Candida albicans NOT DETECTED NOT DETECTED Final   Candida glabrata NOT DETECTED NOT DETECTED Final   Candida krusei NOT DETECTED NOT DETECTED Final   Candida parapsilosis NOT DETECTED NOT DETECTED Final   Candida tropicalis NOT DETECTED NOT DETECTED Final    Comment: Performed at [facility], [address], [place], [place] [address]  Urine culture     Status: None   Collection Time: 12/23/17  1:00 PM  Result Value Ref Range Status   Specimen Description   Final    URINE, CLEAN CATCH Performed at [facility], [address] [place], [place] [address]    Special Requests   Final    NONE Performed at [facility], [address] [place], [place] [address]    Culture   Final    NO  GROWTH Performed at [facility], [address], [place], [place] [address]    Report Status 12/24/2017 FINAL  Final  Culture, blood (routine x 2)     Status: None   Collection Time: 12/25/17  8:00 AM  Result Value Ref Range Status   Specimen Description BLOOD RIGHT WRIST  Final   Special Requests   Final    BOTTLES DRAWN AEROBIC ONLY Blood Culture adequate volume   Culture   Final    NO GROWTH 5 DAYS Performed at [facility], [address], [place], [place] [address]    Report Status 12/30/2017 FINAL  Final  Culture, blood (routine x 2)     Status: None   Collection Time: 12/25/17  8:07 AM  Result Value Ref Range Status   Specimen Description BLOOD LEFT HAND  Final   Special Requests   Final    BOTTLES DRAWN AEROBIC ONLY Blood Culture adequate volume   Culture   Final    NO GROWTH 5 DAYS Performed at [facility], [address], [place], [place] [address]    Report Status 12/30/2017 FINAL  Final    Radiology Studies: No results found.  Scheduled Meds: . feeding supplement (ENSURE ENLIVE)  237 mL Oral BID BM  . heparin injection (subcutaneous)  5,000 Units Subcutaneous Q8H  . nicotine  21 mg Transdermal Daily  . nystatin  5 mL Oral QID  . saccharomyces boulardii  250 mg Oral BID  . sodium chloride flush  10-40 mL Intracatheter Q12H  . sodium chloride flush  3 mL Intravenous Q12H   Continuous Infusions: . sodium chloride 500 mL (12/31/17 1627)  .  ceFAZolin (ANCEF) IV 2 g (01/01/18 0554)    LOS: 9 days   Time spent: 25 minutes  Standley Dakins, MD [facility] Pager [phone]  If 7PM-7AM, please contact night-coverage [email or web address] Password [email or web address] 01/01/2018, 11:08 AM

## 2018-01-01 NOTE — Progress Notes (Signed)
Physical Therapy Treatment Patient Details Name: Howard Diaz MRN: [id number] DOB: 12/18/1985 Today's Date: 01/01/2018    History of Present Illness 32 yo male with onset of sepsis after onset of osteomyelitis on lumbar spine from paraspinous abscess.  Has acute hepatic toxicity and lactic acidosis, AKI, new PICC line for ABT.  PMHx:  EtOH abuse, Hepatitis C, IV drug use,     PT Comments    Patient tolerated sitting up at bedside while completing BLE ROM exercise, had difficulty full extending knees while seated due to increased low back pain, demonstrates increased endurance/distance for gait training in hallways without loss of balance and requested to go back to bed after therapy due to fatigue.  Patient will benefit from continued physical therapy in hospital and recommended venue below to increase strength, balance, endurance for safe ADLs and gait.    Follow Up Recommendations  SNF;Supervision/Assistance - 24 hour;Supervision for mobility/OOB     Equipment Recommendations  Rolling walker with 5" wheels    Recommendations for Other Services       Precautions / Restrictions Precautions Precautions: Fall Restrictions Weight Bearing Restrictions: No    Mobility  Bed Mobility Overal bed mobility: Needs Assistance Bed Mobility: Supine to Sit;Sit to Supine     Supine to sit: Supervision Sit to supine: Supervision   General bed mobility comments: slow labored movement  Transfers Overall transfer level: Needs assistance Equipment used: Rolling walker (2 wheeled) Transfers: Sit to/from [employer] Sit to Stand: Min assist Stand pivot transfers: Min assist       General transfer comment: labored movement  Ambulation/Gait Ambulation/Gait assistance: Min assist Gait Distance (Feet): 75 Feet Assistive device: Rolling walker (2 wheeled) Gait Pattern/deviations: Decreased step length - right;Decreased step length - left;Decreased stride length Gait  velocity: decreased   General Gait Details: slow slightly labored cadence with short step/stride length, no loss of balance, limited secondary to fatigue   Stairs             Wheelchair Mobility    Modified Rankin (Stroke Patients Only)       Balance Overall balance assessment: Needs assistance Sitting-balance support: Feet supported;No upper extremity supported Sitting balance-Leahy Scale: Fair Sitting balance - Comments: fair/good supported with BUE   Standing balance support: Bilateral upper extremity supported;During functional activity Standing balance-Leahy Scale: Fair Standing balance comment: using RW                            Cognition Arousal/Alertness: Awake/alert Behavior During Therapy: WFL for tasks assessed/performed Overall Cognitive Status: Within Functional Limits for tasks assessed                                        Exercises General Exercises - Lower Extremity Long Arc Quad: Seated;AROM;Strengthening;Both;10 reps Hip Flexion/Marching: Seated;AROM;Both;10 reps;Strengthening Toe Raises: Seated;AROM;Strengthening;Both;10 reps Heel Raises: AROM;Strengthening;Both;10 reps;Seated    General Comments        Pertinent Vitals/Pain Pain Assessment: 0-10 Pain Score: 7  Pain Location: low back Pain Descriptors / Indicators: Aching;Sharp Pain Intervention(s): Limited activity within patient's tolerance;Monitored during session;Premedicated before session    Home Living                      Prior Function            PT Goals (current goals can now be found  in the care plan section) Acute Rehab PT Goals Patient Stated Goal: to get home PT Goal Formulation: With patient/family Time For Goal Achievement: 01/13/18 Potential to Achieve Goals: Good Progress towards PT goals: Progressing toward goals    Frequency    Min 3X/week      PT Plan Current plan remains appropriate    Co-evaluation               AM-PAC PT "6 Clicks" Daily Activity  Outcome Measure  Difficulty turning over in bed (including adjusting bedclothes, sheets and blankets)?: A Little Difficulty moving from lying on back to sitting on the side of the bed? : A Little Difficulty sitting down on and standing up from a chair with arms (e.g., wheelchair, bedside commode, etc,.)?: Unable Help needed moving to and from a bed to chair (including a wheelchair)?: A Little Help needed walking in hospital room?: A Little Help needed climbing 3-5 steps with a railing? : A Lot 6 Click Score: 15    End of Session Equipment Utilized During Treatment: Gait belt Activity Tolerance: Patient tolerated treatment well;Patient limited by fatigue Patient left: in bed;with call bell/phone within reach;with family/visitor present Nurse Communication: Mobility status PT Visit Diagnosis: Unsteadiness on feet (R26.81);Muscle weakness (generalized) (M62.81);Pain;Other abnormalities of gait and mobility (R26.89)     Time: 0940-1004 PT Time Calculation (min) (ACUTE ONLY): 24 min  Charges:  $Gait Training: 8-22 mins $Therapeutic Exercise: 8-22 mins                     2:06 PM, 01/01/18 [name], MPT Physical Therapist with [facility] 336 [phone] office [phone] mobile phone

## 2018-01-02 DIAGNOSIS — E43 Unspecified severe protein-calorie malnutrition: Secondary | ICD-10-CM

## 2018-01-02 LAB — CBC
HCT: 22.4 % — ABNORMAL LOW (ref 39.0–52.0)
HEMOGLOBIN: 7.1 g/dL — AB (ref 13.0–17.0)
MCH: 28 pg (ref 26.0–34.0)
MCHC: 31.7 g/dL (ref 30.0–36.0)
MCV: 88.2 fL (ref 80.0–100.0)
NRBC: 0 % (ref 0.0–0.2)
PLATELETS: 393 10*3/uL (ref 150–400)
RBC: 2.54 MIL/uL — AB (ref 4.22–5.81)
RDW: 13.2 % (ref 11.5–15.5)
WBC: 10.1 10*3/uL (ref 4.0–10.5)

## 2018-01-02 LAB — BASIC METABOLIC PANEL
ANION GAP: 8 (ref 5–15)
BUN: 16 mg/dL (ref 6–20)
CHLORIDE: 103 mmol/L (ref 98–111)
CO2: 26 mmol/L (ref 22–32)
Calcium: 8.1 mg/dL — ABNORMAL LOW (ref 8.9–10.3)
Creatinine, Ser: 0.55 mg/dL — ABNORMAL LOW (ref 0.61–1.24)
GFR calc Af Amer: >60 mL/min (ref >60)
Glucose, Bld: 113 mg/dL — ABNORMAL HIGH (ref 70–99)
POTASSIUM: 4 mmol/L (ref 3.5–5.1)
Sodium: 137 mmol/L (ref 135–145)

## 2018-01-02 MED ORDER — VITAMIN C 500 MG PO TABS
500.0000 mg | ORAL_TABLET | Freq: Every day | ORAL | Status: DC
Start: 2018-01-02 — End: 2018-01-21
  Administered 2018-01-02 – 2018-01-21 (×20): 500 mg via ORAL
  Filled 2018-01-02 (×20): qty 1

## 2018-01-02 NOTE — Plan of Care (Signed)

## 2018-01-02 NOTE — Progress Notes (Signed)
Physical Therapy Treatment Patient Details Name: Howard Diaz MRN: [id number] DOB: Mar 23, 1985 Today's Date: 01/02/2018    History of Present Illness 32 yo male with onset of sepsis after onset of osteomyelitis on lumbar spine from paraspinous abscess.  Has acute hepatic toxicity and lactic acidosis, AKI, new PICC line for ABT.  PMHx:  EtOH abuse, Hepatitis C, IV drug use,     PT Comments    Patient is making good progress with PT. Patient was able to perform therapeutic exercises sitting at edge of bed, including 10 times sit-to-stand with UE use on RW. Patient able to ambulated further and all mobility was with less assistance than previous visits. Patient relatively flat affect despite reporting 10/10 pain. Patient did state he was in less pain upright and walking than lying in bed. Patient would like to contact his mother to bring him a back brace that was given to him. Patient reports he is able to move better when the back brace is donned tight.  Patient would continue to benefit from skilled physical therapy in current environment and next venue to continue return to prior function and increase strength, endurance, balance, coordination, and functional mobility and gait skills.      Follow Up Recommendations  SNF;Supervision/Assistance - 24 hour;Supervision for mobility/OOB     Equipment Recommendations  Rolling walker with 5" wheels    Recommendations for Other Services       Precautions / Restrictions Precautions Precautions: Fall Restrictions Weight Bearing Restrictions: No    Mobility  Bed Mobility Overal bed mobility: Needs Assistance Bed Mobility: Supine to Sit;Sit to Supine     Supine to sit: Supervision Sit to supine: Supervision   General bed mobility comments: somewhat slow labored movement  Transfers Overall transfer level: Needs assistance Equipment used: Rolling walker (2 wheeled) Transfers: Sit to/from [employer] Sit to Stand: Min  guard;Supervision Stand pivot transfers: Min guard;Supervision       General transfer comment: labored movement  Ambulation/Gait Ambulation/Gait assistance: Min guard Gait Distance (Feet): 150 Feet Assistive device: Rolling walker (2 wheeled) Gait Pattern/deviations: Decreased step length - right;Decreased step length - left;Decreased stride length Gait velocity: decreased   General Gait Details: slow slightly labored cadence with short step/stride length, no loss of balance; Hbg 7.1; Hct 22.4; patient reported felt good to walk and get out of bed - less back pain walking than lying down   Stairs             Wheelchair Mobility    Modified Rankin (Stroke Patients Only)       Balance Overall balance assessment: Needs assistance Sitting-balance support: Feet supported;No upper extremity supported Sitting balance-Leahy Scale: Good Sitting balance - Comments: fair/good supported with BUE   Standing balance support: Bilateral upper extremity supported;During functional activity Standing balance-Leahy Scale: Fair Standing balance comment: using RW                            Cognition Arousal/Alertness: Awake/alert Behavior During Therapy: WFL for tasks assessed/performed Overall Cognitive Status: Within Functional Limits for tasks assessed                                        Exercises General Exercises - Upper Extremity Shoulder Flexion: AROM;Strengthening;Both;10 reps;Seated General Exercises - Lower Extremity Long Arc Quad: Seated;AROM;Strengthening;Both;10 reps Hip Flexion/Marching: Seated;AROM;Both;10 reps;Strengthening Toe Raises: Seated;AROM;Strengthening;Both;10 reps  Heel Raises: AROM;Strengthening;Both;10 reps;Seated Other Exercises Other Exercises: sit-to-stands - x10 with UE use on RW; erect posture throughout    General Comments        Pertinent Vitals/Pain Pain Assessment: 0-10 Pain Score: 10-Worst pain ever Pain  Location: low back Pain Descriptors / Indicators: Throbbing(swollen) Pain Intervention(s): Limited activity within patient's tolerance;Monitored during session    Home Living                      Prior Function            PT Goals (current goals can now be found in the care plan section) Acute Rehab PT Goals Patient Stated Goal: to get home PT Goal Formulation: With patient/family Time For Goal Achievement: 01/13/18 Potential to Achieve Goals: Good Progress towards PT goals: Progressing toward goals    Frequency    Min 3X/week      PT Plan Current plan remains appropriate    Co-evaluation              AM-PAC PT "6 Clicks" Daily Activity  Outcome Measure  Difficulty turning over in bed (including adjusting bedclothes, sheets and blankets)?: A Little Difficulty moving from lying on back to sitting on the side of the bed? : A Little Difficulty sitting down on and standing up from a chair with arms (e.g., wheelchair, bedside commode, etc,.)?: Unable Help needed moving to and from a bed to chair (including a wheelchair)?: A Little Help needed walking in hospital room?: A Little Help needed climbing 3-5 steps with a railing? : A Lot 6 Click Score: 15    End of Session Equipment Utilized During Treatment: Gait belt Activity Tolerance: Patient tolerated treatment well Patient left: in bed;with call bell/phone within reach(sitting at EOB; refused to sit in recliner) Nurse Communication: Mobility status PT Visit Diagnosis: Unsteadiness on feet (R26.81);Muscle weakness (generalized) (M62.81);Pain;Other abnormalities of gait and mobility (R26.89) Pain - Right/Left: (low back) Pain - part of body: (low back)     Time: 1240-1305 PT Time Calculation (min) (ACUTE ONLY): 25 min  Charges:  $Gait Training: 8-22 mins $Therapeutic Exercise: 8-22 mins                      Katina DungBarbara D. Hartnett-Rands, MS, PT [facility] [phone] 01/02/2018,  1:45 PM

## 2018-01-02 NOTE — Progress Notes (Signed)
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  Assessment & Plan:   Principal Problem:   Acute osteomyelitis of lumbar spine (HCC) Active Problems:   IV drug user   Hepatitis-C   Lactic acid acidosis   Sepsis (HCC)   Hypokalemia   Tobacco abuse   Protein-calorie malnutrition, severe  MSSA tricuspid valve endocarditis: Likely secondary to IV drug use.  Tricuspid valve vegetation noted on echo on 12/24/2017.  Unfortunately the patient is not a candidate for home antibiotics due to his ongoing drug use. - Continue IV cefazolin started 12/25/2017 to continue thru 02/17/18.  -Blood cultures 12/23/2017 growing out MSSA - Blood cultures from 12/25/2017 no growth to date,  PICC line placed on 11/11 -ID consult in chart -Cardiology consulted, no plan for TEE, since vegetation seen on TTE  Septic pulmonary emboli: Noted on CT of the chest.  Patient continues to have small volume hemoptysis.  Anemia: Stable at this time.  Likely multifactorial including bone marrow suppression from sepsis, anemia of chronic disease and iron deficiency anemia based on lab work -Supplement with [employer] given.  Following.  Transfuse for Hg <7.   Abdominal pain/diarrhea: Resolved.    Left shoulder myositis and septic bursitis: Noted on MRI on 12/25/2017. -Orthopedic surgery does not recommend any aspiration as there is too little fluid  L3/4 osteomyelitis, paraspinous phlegmon, right psoas muscle abscess: All likely secondary to endocarditis.  The abscess is not significant sufficiently large for drainage at this time. -Antibiotics per above -Continue pain  control  IV drug use:  -Counseling provided -Patient does not want any medication for withdrawal  Chronic hepatitis C: -Outpatient treatment   DVT prophylaxis: Heparin Code Status: Full Family Communication: Mother at bedside Disposition Plan: Continue IV Ancef through 12/31 for full 8-week course of treatment as recommended initially by ID.  PICC line has been placed and waiting for Medicaid approval and facility placement.     Consultants:   ID  Cardiology  Orthopedic surgery  Procedures:  Echo 12/24/2017:- Left ventricle: The cavity size was normal. Wall thickness was normal. Systolic function was normal. The estimated ejection fraction was in the range of 55% to 60%. Wall motion was normal; there were no regional wall motion abnormalities. - Mitral valve: There was trivial regurgitation. - Right atrium: Central venous pressure (est): 3 mm Hg. - Atrial septum: No defect or patent foramen ovale was identified. - Tricuspid valve: Large, homogeneous echodensity associated with the atrial side of the septal tricuspid leaflet consistent with vegetation in the setting of documented bacteremia. There was moderate regurgitation. - Pulmonary arteries: PA peak pressure: 28 mm Hg (S). - Pericardium, extracardiac: A moderate pericardial effusion was identified posterior to the heart.  Antimicrobials:   IV vancomycin started 12/23/2017 to 12/25/2017  IV aztreonam 12/23/2017 to 12/24/2017  IV cefazolin started 12/25/2017 to ongoing   Subjective: Patient reports continued back pain especially at night but he is ambulating in the halls with PT.      Objective: Vitals:   01/01/18 1700 01/01/18 2019 01/01/18 2118 01/02/18 0539  BP:   117/70 122/72  Pulse:   75 69  Resp:   18 16  Temp:   99.3 F (  37.4 C) 98.2 F (36.8 C)  TempSrc:   Oral Oral  SpO2:  93% 97% 95%  Weight: 78.7 kg     Height:        Intake/Output Summary (Last 24 hours) at 01/02/2018  1142 Last data filed at 01/02/2018 0910 Gross per 24 hour  Intake 2205.6 ml  Output 1425 ml  Net 780.6 ml   Filed Weights   12/23/17 0931 12/23/17 1627 01/01/18 1700  Weight: 81.6 kg 77.4 kg 78.7 kg    Examination:  General exam: Appears calm and comfortable  Respiratory system: Clear to auscultation. Respiratory effort normal. Cardiovascular system: S1 & S2 heard, RRR. No JVD. No pedal edema. Gastrointestinal system: Abdomen is nondistended, soft and nontender. No organomegaly or masses felt. Normal bowel sounds heard. Central nervous system: Alert and oriented. No focal neurological deficits. Extremities: Symmetric 5 x 5 power. Skin: No rashes, lesions or ulcers Psychiatry: Judgement and insight appear normal. Mood & affect appropriate.   Data Reviewed: I have personally reviewed following labs and imaging studies  CBC: Recent Labs  Lab 12/28/17 0648 12/29/17 0457 12/30/17 0534 12/31/17 0532 01/02/18 0611  WBC 11.4* 10.1 9.1 9.6 10.1  HGB 7.8* 7.8* 7.5* 7.2* 7.1*  HCT 24.9* 24.7* 23.8*  22.4* 23.0* 22.4*  MCV 86.5 87.0 86.5 87.8 88.2  PLT 308 359 345 359 393   Basic Metabolic Panel: Recent Labs  Lab 12/27/17 0450 12/28/17 0648 12/29/17 0457 12/30/17 0534 12/31/17 0532 01/02/18 0611  NA 135 134* 135 135 136 137  K 3.2* 3.5 3.6 4.0 4.1 4.0  CL 109 108 109 106 105 103  CO2 18* 19* 20* 22 23 26   GLUCOSE 104* 111* 99 109* 107* 113*  BUN 12 11 11 15 15 16   CREATININE 0.86 0.70 0.80 0.73 0.73 0.55*  CALCIUM 7.8* 7.9* 7.9* 7.8* 7.9* 8.1*  MG 1.8  --   --   --   --   --    GFR: Estimated Creatinine Clearance: 139.9 mL/min (A) (by C-G formula based on SCr of 0.55 mg/dL (L)). Liver Function Tests: Recent Labs  Lab 12/27/17 0450 12/30/17 0534  AST 53* 83*  ALT 24 42  ALKPHOS 156* 215*  BILITOT 0.9 0.7  PROT 6.4* 6.6  ALBUMIN 1.5* 1.6*   No results for input(s): LIPASE, AMYLASE in the last 168 hours. No results for input(s): AMMONIA in the last 168  hours. Coagulation Profile: No results for input(s): INR, PROTIME in the last 168 hours. Cardiac Enzymes: No results for input(s): CKTOTAL, CKMB, CKMBINDEX, TROPONINI in the last 168 hours. BNP (last 3 results) No results for input(s): PROBNP in the last 8760 hours. HbA1C: No results for input(s): HGBA1C in the last 72 hours. CBG: No results for input(s): GLUCAP in the last 168 hours. Lipid Profile: No results for input(s): CHOL, HDL, LDLCALC, TRIG, CHOLHDL, LDLDIRECT in the last 72 hours. Thyroid Function Tests: No results for input(s): TSH, T4TOTAL, FREET4, T3FREE, THYROIDAB in the last 72 hours. Anemia Panel: No results for input(s): VITAMINB12, FOLATE, FERRITIN, TIBC, IRON, RETICCTPCT in the last 72 hours. Sepsis Labs: No results for input(s): PROCALCITON, LATICACIDVEN in the last 168 hours.  Recent Results (from the past 240 hour(s))  Urine culture     Status: None   Collection Time: 12/23/17  1:00 PM  Result Value Ref Range Status   Specimen Description   Final    URINE, CLEAN CATCH Performed at [facility], [address] [place], [place] [address]    Special  Requests   Final    NONE Performed at [facility], [address] [place], [place] [address]    Culture   Final    NO GROWTH Performed at [facility], [address] [place], [place] [address]    Report Status 12/24/2017 FINAL  Final  Culture, blood (routine x 2)     Status: None   Collection Time: 12/25/17  8:00 AM  Result Value Ref Range Status   Specimen Description BLOOD RIGHT WRIST  Final   Special Requests   Final    BOTTLES DRAWN AEROBIC ONLY Blood Culture adequate volume   Culture   Final    NO GROWTH 5 DAYS Performed at [facility], [address] [place], [place] [address]    Report Status 12/30/2017 FINAL  Final  Culture, blood (routine x 2)     Status: None   Collection Time: 12/25/17  8:07 AM  Result Value Ref Range Status   Specimen Description BLOOD LEFT HAND  Final   Special  Requests   Final    BOTTLES DRAWN AEROBIC ONLY Blood Culture adequate volume   Culture   Final    NO GROWTH 5 DAYS Performed at [facility], [address], [place], [place] [address]    Report Status 12/30/2017 FINAL  Final    Radiology Studies: No results found.  Scheduled Meds: . feeding supplement (ENSURE ENLIVE)  237 mL Oral QID  . feeding supplement (PRO-STAT SUGAR FREE 64)  30 mL Oral TID  . heparin injection (subcutaneous)  5,000 Units Subcutaneous Q8H  . nicotine  21 mg Transdermal Daily  . nystatin  5 mL Oral QID  . saccharomyces boulardii  250 mg Oral BID  . senna-docusate  1 tablet Oral QHS  . sodium chloride flush  10-40 mL Intracatheter Q12H  . sodium chloride flush  3 mL Intravenous Q12H  . vitamin C  500 mg Oral Daily   Continuous Infusions: . sodium chloride 10 mL/hr at 01/02/18 0458  .  ceFAZolin (ANCEF) IV 2 g (01/02/18 0541)    LOS: 10 days   Time spent: 22 minutes  Standley Dakins, MD [facility] Pager [phone]  If 7PM-7AM, please contact night-coverage [email or web address] Password [email or web address] 01/02/2018, 11:42 AM

## 2018-01-03 LAB — VITAMIN D 25 HYDROXY (VIT D DEFICIENCY, FRACTURES): VIT D 25 HYDROXY: 36.2 ng/mL (ref 30.0–100.0)

## 2018-01-03 NOTE — Progress Notes (Signed)
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  Assessment & Plan:   Principal Problem:   Acute osteomyelitis of lumbar spine (HCC) Active Problems:   IV drug user   Hepatitis-C   Lactic acid acidosis   Sepsis (HCC)   Hypokalemia   Tobacco abuse   Protein-calorie malnutrition, severe  MSSA tricuspid valve endocarditis: Likely secondary to IV drug use.  Tricuspid valve vegetation noted on echo on 12/24/2017.  Unfortunately the patient is not a candidate for home antibiotics due to his ongoing drug use. - Continue IV cefazolin started 12/25/2017 to continue thru 02/17/18.  - Blood cultures 12/23/2017 growing out MSSA - Blood cultures from 12/25/2017 no growth to date,  PICC line placed on 11/11 - ID consult in chart - Cardiology consulted, no plan for TEE, since vegetation seen on TTE  Septic pulmonary emboli: Noted on CT of the chest.  Patient continues to have small volume hemoptysis.  Anemia: Stable at this time.  Likely multifactorial including bone marrow suppression from sepsis, anemia of chronic disease and iron deficiency anemia based on lab work -Supplement with [employer] given.  Following.  Transfuse for Hg <7.   Abdominal pain/diarrhea: Resolved.    Left shoulder myositis and septic bursitis: Noted on MRI on 12/25/2017. -Orthopedic surgery does not recommend any aspiration as there is too little fluid  L3/4 osteomyelitis, paraspinous phlegmon, right psoas muscle abscess: All likely secondary to endocarditis.  The abscess is not significant sufficiently large for drainage at this time. -Antibiotics per above -Continue pain  control  IV drug use:  -Counseling provided -Patient does not want any medication for withdrawal, however he remains on IV morphine for severe back pain a/w discitis.   Chronic hepatitis C: -Outpatient treatment   DVT prophylaxis: Heparin Code Status: Full Family Communication: Mother at bedside Disposition Plan: Continue IV Ancef through 12/31 for full 8-week course of treatment as recommended initially by ID.  PICC line has been placed and waiting for Medicaid approval and facility placement.  Per CM team, pt may have to stay in hospital for full treatment course.   Consultants:   ID  Cardiology  Orthopedic surgery  Procedures:  Echo 12/24/2017:- Left ventricle: The cavity size was normal. Wall thickness was normal. Systolic function was normal. The estimated ejection fraction was in the range of 55% to 60%. Wall motion was normal; there were no regional wall motion abnormalities. - Mitral valve: There was trivial regurgitation. - Right atrium: Central venous pressure (est): 3 mm Hg. - Atrial septum: No defect or patent foramen ovale was identified. - Tricuspid valve: Large, homogeneous echodensity associated with the atrial side of the septal tricuspid leaflet consistent with vegetation in the setting of documented bacteremia. There was moderate regurgitation. - Pulmonary arteries: PA peak pressure: 28 mm Hg (S). - Pericardium, extracardiac: A moderate pericardial effusion was identified posterior to the heart.  Antimicrobials:   IV vancomycin started 12/23/2017 to 12/25/2017  IV aztreonam 12/23/2017 to 12/24/2017  IV cefazolin started 12/25/2017 to ongoing   Subjective: Patient slept thru the night and pain was manageable. He is having bowel movements.  He is ambulating with a walker and PT     Objective: Vitals:  01/02/18 0539 01/02/18 1455 01/02/18 2019 01/03/18 0555  BP: 122/72 140/82 130/78 125/79  Pulse: 69 66 68 69  Resp: 16 18 18 17     Temp: 98.2 F (36.8 C) 97.9 F (36.6 C) 98.4 F (36.9 C) 98.3 F (36.8 C)  TempSrc: Oral Oral Oral Oral  SpO2: 95% 100% 99% 98%  Weight:      Height:        Intake/Output Summary (Last 24 hours) at 01/03/2018 1045 Last data filed at 01/03/2018 0900 Gross per 24 hour  Intake 1438.62 ml  Output 1450 ml  Net -11.38 ml   Filed Weights   12/23/17 0931 12/23/17 1627 01/01/18 1700  Weight: 81.6 kg 77.4 kg 78.7 kg    Examination:  General exam: Appears calm and comfortable  Respiratory system: Clear to auscultation. Respiratory effort normal. Cardiovascular system: S1 & S2 heard, RRR. No JVD. No pedal edema. Gastrointestinal system: Abdomen is nondistended, soft and nontender. No organomegaly or masses felt. Normal bowel sounds heard. Central nervous system: Alert and oriented. No focal neurological deficits. Extremities: Symmetric 5 x 5 power. Skin: No rashes, lesions or ulcers Psychiatry: Judgement and insight appear normal. Mood & affect appropriate.   Data Reviewed: I have personally reviewed following labs and imaging studies  CBC: Recent Labs  Lab 12/28/17 0648 12/29/17 0457 12/30/17 0534 12/31/17 0532 01/02/18 0611  WBC 11.4* 10.1 9.1 9.6 10.1  HGB 7.8* 7.8* 7.5* 7.2* 7.1*  HCT 24.9* 24.7* 23.8*  22.4* 23.0* 22.4*  MCV 86.5 87.0 86.5 87.8 88.2  PLT 308 359 345 359 393   Basic Metabolic Panel: Recent Labs  Lab 12/28/17 0648 12/29/17 0457 12/30/17 0534 12/31/17 0532 01/02/18 0611  NA 134* 135 135 136 137  K 3.5 3.6 4.0 4.1 4.0  CL 108 109 106 105 103  CO2 19* 20* 22 23 26   GLUCOSE 111* 99 109* 107* 113*  BUN 11 11 15 15 16   CREATININE 0.70 0.80 0.73 0.73 0.55*  CALCIUM 7.9* 7.9* 7.8* 7.9* 8.1*   GFR: Estimated Creatinine Clearance: 139.9 mL/min (A) (by C-G formula based on SCr of 0.55 mg/dL (L)). Liver Function Tests: Recent Labs  Lab 12/30/17 0534  AST 83*  ALT 42  ALKPHOS 215*  BILITOT 0.7  PROT 6.6  ALBUMIN 1.6*   No results for  input(s): LIPASE, AMYLASE in the last 168 hours. No results for input(s): AMMONIA in the last 168 hours. Coagulation Profile: No results for input(s): INR, PROTIME in the last 168 hours. Cardiac Enzymes: No results for input(s): CKTOTAL, CKMB, CKMBINDEX, TROPONINI in the last 168 hours. BNP (last 3 results) No results for input(s): PROBNP in the last 8760 hours. HbA1C: No results for input(s): HGBA1C in the last 72 hours. CBG: No results for input(s): GLUCAP in the last 168 hours. Lipid Profile: No results for input(s): CHOL, HDL, LDLCALC, TRIG, CHOLHDL, LDLDIRECT in the last 72 hours. Thyroid Function Tests: No results for input(s): TSH, T4TOTAL, FREET4, T3FREE, THYROIDAB in the last 72 hours. Anemia Panel: No results for input(s): VITAMINB12, FOLATE, FERRITIN, TIBC, IRON, RETICCTPCT in the last 72 hours. Sepsis Labs: No results for input(s): PROCALCITON, LATICACIDVEN in the last 168 hours.  Recent Results (from the past 240 hour(s))  Culture, blood (routine x 2)     Status: None   Collection Time: 12/25/17  8:00 AM  Result Value Ref Range Status   Specimen Description BLOOD RIGHT WRIST  Final   Special Requests   Final    BOTTLES DRAWN AEROBIC ONLY  Blood Culture adequate volume   Culture   Final    NO GROWTH 5 DAYS Performed at [facility], [address] [place], [place] [address]    Report Status 12/30/2017 FINAL  Final  Culture, blood (routine x 2)     Status: None   Collection Time: 12/25/17  8:07 AM  Result Value Ref Range Status   Specimen Description BLOOD LEFT HAND  Final   Special Requests   Final    BOTTLES DRAWN AEROBIC ONLY Blood Culture adequate volume   Culture   Final    NO GROWTH 5 DAYS Performed at [facility], [address], [place], [place] [address]    Report Status 12/30/2017 FINAL  Final    Radiology Studies: No results found.  Scheduled Meds: . feeding supplement (ENSURE ENLIVE)  237 mL Oral QID  . feeding supplement (PRO-STAT SUGAR FREE  64)  30 mL Oral TID  . heparin injection (subcutaneous)  5,000 Units Subcutaneous Q8H  . nicotine  21 mg Transdermal Daily  . nystatin  5 mL Oral QID  . saccharomyces boulardii  250 mg Oral BID  . senna-docusate  1 tablet Oral QHS  . sodium chloride flush  10-40 mL Intracatheter Q12H  . sodium chloride flush  3 mL Intravenous Q12H  . vitamin C  500 mg Oral Daily   Continuous Infusions: . sodium chloride 10 mL/hr at 01/02/18 0458  .  ceFAZolin (ANCEF) IV 2 g (01/03/18 0559)    LOS: 11 days   Time spent: 24 minutes  Standley Dakins, MD [facility] Pager [phone]  If 7PM-7AM, please contact night-coverage [email or web address] Password [email or web address] 01/03/2018, 10:45 AM

## 2018-01-03 NOTE — Plan of Care (Signed)

## 2018-01-04 DIAGNOSIS — E43 Unspecified severe protein-calorie malnutrition: Secondary | ICD-10-CM

## 2018-01-04 LAB — CBC
HCT: 24 % — ABNORMAL LOW (ref 39.0–52.0)
Hemoglobin: 7.4 g/dL — ABNORMAL LOW (ref 13.0–17.0)
MCH: 27.5 pg (ref 26.0–34.0)
MCHC: 30.8 g/dL (ref 30.0–36.0)
MCV: 89.2 fL (ref 80.0–100.0)
NRBC: 0 % (ref 0.0–0.2)
PLATELETS: 433 10*3/uL — AB (ref 150–400)
RBC: 2.69 MIL/uL — AB (ref 4.22–5.81)
RDW: 14 % (ref 11.5–15.5)
WBC: 9.1 10*3/uL (ref 4.0–10.5)

## 2018-01-04 MED ORDER — CYCLOBENZAPRINE HCL 10 MG PO TABS
10.0000 mg | ORAL_TABLET | Freq: Three times a day (TID) | ORAL | Status: DC
  Administered 2018-01-04 – 2018-01-09 (×15): 10 mg via ORAL
  Filled 2018-01-04 (×15): qty 1

## 2018-01-04 MED ORDER — LIDOCAINE 5 % EX PTCH
1.0000 | MEDICATED_PATCH | CUTANEOUS | Status: DC
  Administered 2018-01-04 – 2018-01-12 (×9): 1 via TRANSDERMAL
  Filled 2018-01-04 (×10): qty 1

## 2018-01-04 MED ORDER — MORPHINE SULFATE (PF) 2 MG/ML IV SOLN
2.0000 mg | Freq: Four times a day (QID) | INTRAVENOUS | Status: DC | PRN
  Administered 2018-01-04 – 2018-01-06 (×3): 2 mg via INTRAVENOUS
  Filled 2018-01-04 (×3): qty 1

## 2018-01-04 NOTE — Progress Notes (Signed)
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  Assessment & Plan:   Principal Problem:   Acute osteomyelitis of lumbar spine (HCC) Active Problems:   IV drug user   Hepatitis-C   Lactic acid acidosis   Sepsis (HCC)   Hypokalemia   Tobacco abuse   Protein-calorie malnutrition, severe  MSSA tricuspid valve endocarditis: Likely secondary to IV drug use.  Tricuspid valve vegetation noted on echo on 12/24/2017.  Unfortunately the patient is not a candidate for home antibiotics due to his ongoing drug use. - Continue IV cefazolin started 12/25/2017 to continue thru 02/17/18.  - Blood cultures 12/23/2017 growing out MSSA - Blood cultures from 12/25/2017 no growth to date,  PICC line placed on 11/11 - ID consult in chart - Cardiology consulted, no plan for TEE, since vegetation seen on TTE  Septic pulmonary emboli: Noted on CT of the chest.  Patient continues to have small volume hemoptysis.  Anemia: Stable at this time.  Likely multifactorial including bone marrow suppression from sepsis, anemia of chronic disease and iron deficiency anemia based on lab work -Supplement with [employer] given.  Following.  Transfuse for Hg <7.   Abdominal pain/diarrhea: Resolved.    Left shoulder myositis and septic bursitis: Noted on MRI on 12/25/2017. -Orthopedic surgery does not recommend any aspiration as there is too little fluid  L3/4 osteomyelitis, paraspinous phlegmon, right psoas muscle abscess: All likely secondary to endocarditis.  The abscess is not significant sufficiently large for drainage at this time. -Antibiotics per above -Continue pain  control  IV drug use:  -Counseling provided -Patient does not want any medication for withdrawal, however he remains on IV morphine for severe back pain a/w discitis.  Plan to taper pain medications  Chronic hepatitis C: -Outpatient treatment   DVT prophylaxis: Heparin Code Status: Full Family Communication: Mother at bedside Disposition Plan: Continue IV Ancef through 12/31 for full 8-week course of treatment as recommended initially by ID.  PICC line has been placed and waiting for Medicaid approval and facility placement.  Per CM team, pt may have to stay in hospital for full treatment course.   Consultants:   ID  Cardiology  Orthopedic surgery  Procedures:  Echo 12/24/2017:- Left ventricle: The cavity size was normal. Wall thickness was normal. Systolic function was normal. The estimated ejection fraction was in the range of 55% to 60%. Wall motion was normal; there were no regional wall motion abnormalities. - Mitral valve: There was trivial regurgitation. - Right atrium: Central venous pressure (est): 3 mm Hg. - Atrial septum: No defect or patent foramen ovale was identified. - Tricuspid valve: Large, homogeneous echodensity associated with the atrial side of the septal tricuspid leaflet consistent with vegetation in the setting of documented bacteremia. There was moderate regurgitation. - Pulmonary arteries: PA peak pressure: 28 mm Hg (S). - Pericardium, extracardiac: A moderate pericardial effusion was identified posterior to the heart.  Antimicrobials:   IV vancomycin started 12/23/2017 to 12/25/2017  IV aztreonam 12/23/2017 to 12/24/2017  IV cefazolin started 12/25/2017 to ongoing   Subjective: Complains of continued back pain as well as pain in his shoulders and generalized joints.  Has worsening pain when ambulating.  Objective: Vitals:  01/03/18 2038 01/03/18 2226 01/04/18 0551 01/04/18 1418  BP:  122/80 112/74 (!) 144/82  Pulse:  73 68  71  Resp:  17 16 18   Temp:  98.7 F (37.1 C) 98.6 F (37 C) 99.2 F (37.3 C)  TempSrc:  Oral Oral Oral  SpO2: 98% 97% 95% 98%  Weight:      Height:        Intake/Output Summary (Last 24 hours) at 01/04/2018 1810 Last data filed at 01/04/2018 1753 Gross per 24 hour  Intake 1080 ml  Output 2600 ml  Net -1520 ml   Filed Weights   12/23/17 0931 12/23/17 1627 01/01/18 1700  Weight: 81.6 kg 77.4 kg 78.7 kg    Examination:  General exam: Alert, awake, oriented x 3 Respiratory system: Clear to auscultation. Respiratory effort normal. Cardiovascular system:RRR. No murmurs, rubs, gallops. Gastrointestinal system: Abdomen is nondistended, soft and nontender. No organomegaly or masses felt. Normal bowel sounds heard. Central nervous system: Alert and oriented. No focal neurological deficits. Extremities: No C/C/E, +pedal pulses Skin: No rashes, lesions or ulcers Psychiatry: Judgement and insight appear normal. Mood & affect appropriate.  hes, lesions or ulcers Psychiatry: Judgement and insight appear normal. Mood & affect appropriate.   Data Reviewed: I have personally reviewed following labs and imaging studies  CBC: Recent Labs  Lab 12/29/17 0457 12/30/17 0534 12/31/17 0532 01/02/18 0611 01/04/18 1002  WBC 10.1 9.1 9.6 10.1 9.1  HGB 7.8* 7.5* 7.2* 7.1* 7.4*  HCT 24.7* 23.8*  22.4* 23.0* 22.4* 24.0*  MCV 87.0 86.5 87.8 88.2 89.2  PLT 359 345 359 393 433*   Basic Metabolic Panel: Recent Labs  Lab 12/29/17 0457 12/30/17 0534 12/31/17 0532 01/02/18 0611  NA 135 135 136 137  K 3.6 4.0 4.1 4.0  CL 109 106 105 103  CO2 20* 22 23 26   GLUCOSE 99 109* 107* 113*  BUN 11 15 15 16   CREATININE 0.80 0.73 0.73 0.55*  CALCIUM 7.9* 7.8* 7.9* 8.1*   GFR: Estimated Creatinine Clearance: 139.9 mL/min (A) (by C-G formula based on SCr of 0.55 mg/dL (L)). Liver Function Tests: Recent Labs  Lab 12/30/17 0534  AST 83*  ALT 42  ALKPHOS 215*  BILITOT 0.7  PROT 6.6  ALBUMIN  1.6*   No results for input(s): LIPASE, AMYLASE in the last 168 hours. No results for input(s): AMMONIA in the last 168 hours. Coagulation Profile: No results for input(s): INR, PROTIME in the last 168 hours. Cardiac Enzymes: No results for input(s): CKTOTAL, CKMB, CKMBINDEX, TROPONINI in the last 168 hours. BNP (last 3 results) No results for input(s): PROBNP in the last 8760 hours. HbA1C: No results for input(s): HGBA1C in the last 72 hours. CBG: No results for input(s): GLUCAP in the last 168 hours. Lipid Profile: No results for input(s): CHOL, HDL, LDLCALC, TRIG, CHOLHDL, LDLDIRECT in the last 72 hours. Thyroid Function Tests: No results for input(s): TSH, T4TOTAL, FREET4, T3FREE, THYROIDAB in the last 72 hours. Anemia Panel: No results for input(s): VITAMINB12, FOLATE, FERRITIN, TIBC, IRON, RETICCTPCT in the last 72 hours. Sepsis Labs: No results for input(s): PROCALCITON, LATICACIDVEN in the last 168 hours.  No results found for this or any previous visit (from the past 240 hour(s)).  Radiology Studies: No results found.  Scheduled Meds: . cyclobenzaprine  10 mg Oral TID  . feeding supplement (ENSURE ENLIVE)  237 mL Oral QID  . feeding supplement (PRO-STAT SUGAR FREE 64)  30 mL Oral TID  . heparin injection (subcutaneous)  5,000  Units Subcutaneous Q8H  . lidocaine  1 patch Transdermal Q24H  . nicotine  21 mg Transdermal Daily  . nystatin  5 mL Oral QID  . saccharomyces boulardii  250 mg Oral BID  . senna-docusate  1 tablet Oral QHS  . sodium chloride flush  10-40 mL Intracatheter Q12H  . sodium chloride flush  3 mL Intravenous Q12H  . vitamin C  500 mg Oral Daily   Continuous Infusions: . sodium chloride 10 mL/hr at 01/02/18 0458  .  ceFAZolin (ANCEF) IV 2 g (01/04/18 1316)    LOS: 12 days   Time spent: 35 minutes  Erick Blinks, MD [facility] Pager [phone]  If 7PM-7AM, please contact night-coverage [email or web address] Password [email or web address] 01/04/2018,  6:10 PM

## 2018-01-04 NOTE — Progress Notes (Signed)
Patient complaining of severe pain.  On call MD notified via text page.  MD not willing to change pain medication at this time.  Nurse advised to give PRN dose early.  Patient notified of MD decision and pain medication given.  Will continue to monitor.

## 2018-01-05 LAB — CREATININE, SERUM
CREATININE: 0.63 mg/dL (ref 0.61–1.24)
GFR calc Af Amer: >60 mL/min (ref >60)
GFR calc non Af Amer: >60 mL/min (ref >60)

## 2018-01-05 NOTE — Progress Notes (Signed)
Pharmacy Antibiotic Note  Howard Diaz is a 32 y.o. male admitted on 12/23/2017 with MSSA bacteremia/endocarditis.  Pharmacy has been consulted for cefazolin dosing.  Per patient, he has tolerated keflex in the past despite penicillin allergy. 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 Tricuspid valve vegetation noted on echo on 12/24/2017. Marland Kitchen. BCX from 11/7  remain negative.   Plan: Continue Cefazolin 2000 mg IV every 8 hours. Per ID, 8 weeks cefazolin (12/25/2017 to continue thru 02/17/18) Monitor labs, c/s, and patient improvement.  Height: 5\' 11"  (180.3 cm) Weight: 173 lb 6.4 oz (78.7 kg) IBW/kg (Calculated) : 75.3  Temp (24hrs), Avg:99.5 F (37.5 C), Min:99.1 F (37.3 C), Max:100.1 F (37.8 C)  Recent Labs  Lab 12/30/17 0534 12/31/17 0532 01/02/18 0611 01/04/18 1002 01/05/18 0522  WBC 9.1 9.6 10.1 9.1  --   CREATININE 0.73 0.73 0.55*  --  0.63    Estimated Creatinine Clearance: 139.9 mL/min (by C-G formula based on SCr of 0.63 mg/dL).    Allergies  Allergen Reactions  . Penicillins Anaphylaxis    Has patient had a PCN reaction causing immediate rash, facial/tongue/throat swelling, SOB or lightheadedness with hypotension: Yes Has patient had a PCN reaction causing severe rash involving mucus membranes or skin necrosis: Yes Has patient had a PCN reaction that required hospitalization:Yes Has patient had a PCN reaction occurring within the last 10 years: No If all of the above answers are "NO", then may proceed with Cephalosporin use.     Antimicrobials this admission: Vanco 11/5 >> 11/7 Aztreonam 11/5 >> 11/6 Levaquin 11/5 >>11/5 Cefazolin 11/7 >>  Microbiology results: 11/7 BXx: ng 11/5 BCx: MSSA 11/5 UCx: ng  Thank you for allowing pharmacy to be a part of this patient's care.  Elder CyphersLorie Ozan Maclay, BS Pharm D, [place] Clinical Pharmacist Pager [phone] 01/05/2018 8:46 AM

## 2018-01-05 NOTE — Progress Notes (Signed)
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  Assessment & Plan:   Principal Problem:   Acute osteomyelitis of lumbar spine (HCC) Active Problems:   IV drug user   Hepatitis-C   Lactic acid acidosis   Sepsis (HCC)   Hypokalemia   Tobacco abuse   Protein-calorie malnutrition, severe  MSSA tricuspid valve endocarditis: Likely secondary to IV drug use.  Tricuspid valve vegetation noted on echo on 12/24/2017.  Unfortunately the patient is not a candidate for home antibiotics due to his ongoing drug use. - Continue IV cefazolin started 12/25/2017 to continue thru 02/17/18.  - Blood cultures 12/23/2017 growing out MSSA - Blood cultures from 12/25/2017 no growth to date,  PICC line placed on 11/11 - ID consult in chart - Cardiology consulted, no plan for TEE, since vegetation seen on TTE  Septic pulmonary emboli: Noted on CT of the chest.  Patient continues to have small volume hemoptysis.  Anemia: Stable at this time.  Likely multifactorial including bone marrow suppression from sepsis, anemia of chronic disease and iron deficiency anemia based on lab work -Supplement with [employer] given.  Following.  Transfuse for Hg <7.   Abdominal pain/diarrhea: Resolved.    Left shoulder myositis and septic bursitis: Noted on MRI on 12/25/2017. -Orthopedic surgery does not recommend any aspiration as there is too little fluid  L3/4 osteomyelitis, paraspinous phlegmon, right psoas muscle abscess: All likely secondary to endocarditis.  The abscess is not significant sufficiently large for drainage at this time. -Antibiotics per above -Continue pain  control  IV drug use:  -Counseling provided -Patient does not want any medication for withdrawal, however he remains on IV morphine for severe back pain a/w discitis.  Plan to taper pain medications  Chronic hepatitis C: -Outpatient treatment   DVT prophylaxis: Heparin Code Status: Full Family Communication: Mother at bedside Disposition Plan: Continue IV Ancef through 12/31 for full 8-week course of treatment as recommended initially by ID.  PICC line has been placed and waiting for Medicaid approval and facility placement.  Per CM team, pt may have to stay in hospital for full treatment course.   Consultants:   ID  Cardiology  Orthopedic surgery  Procedures:  Echo 12/24/2017:- Left ventricle: The cavity size was normal. Wall thickness was normal. Systolic function was normal. The estimated ejection fraction was in the range of 55% to 60%. Wall motion was normal; there were no regional wall motion abnormalities. - Mitral valve: There was trivial regurgitation. - Right atrium: Central venous pressure (est): 3 mm Hg. - Atrial septum: No defect or patent foramen ovale was identified. - Tricuspid valve: Large, homogeneous echodensity associated with the atrial side of the septal tricuspid leaflet consistent with vegetation in the setting of documented bacteremia. There was moderate regurgitation. - Pulmonary arteries: PA peak pressure: 28 mm Hg (S). - Pericardium, extracardiac: A moderate pericardial effusion was identified posterior to the heart.  Antimicrobials:   IV vancomycin started 12/23/2017 to 12/25/2017  IV aztreonam 12/23/2017 to 12/24/2017  IV cefazolin started 12/25/2017 to ongoing   Subjective: Feels that the pain control is low but better with the oral pain medication as opposed to IV morphine.  Still has some pain  in his back.  Objective: Vitals:   01/04/18 1418 01/04/18 2210 01/05/18 0511 01/05/18 1454  BP: (!) 144/82 124/77 121/78 112/69    Pulse: 71 86 67 74  Resp: 18 18 18 16   Temp: 99.2 F (37.3 C) 100.1 F (37.8 C) 99.1 F (37.3 C)   TempSrc: Oral Oral Oral   SpO2: 98% 95% 96% 98%  Weight:      Height:        Intake/Output Summary (Last 24 hours) at 01/05/2018 1737 Last data filed at 01/05/2018 1500 Gross per 24 hour  Intake 1180 ml  Output 2700 ml  Net -1520 ml   Filed Weights   12/23/17 0931 12/23/17 1627 01/01/18 1700  Weight: 81.6 kg 77.4 kg 78.7 kg    Examination:  General exam: Alert, awake, oriented x 3 Respiratory system: Clear to auscultation. Respiratory effort normal. Cardiovascular system:RRR. No murmurs, rubs, gallops. Gastrointestinal system: Abdomen is nondistended, soft and nontender. No organomegaly or masses felt. Normal bowel sounds heard. Central nervous system: Alert and oriented. No focal neurological deficits. Extremities: No C/C/E, +pedal pulses Skin: No rashes, lesions or ulcers Psychiatry: Judgement and insight appear normal. Mood & affect appropriate.    Data Reviewed: I have personally reviewed following labs and imaging studies  CBC: Recent Labs  Lab 12/30/17 0534 12/31/17 0532 01/02/18 0611 01/04/18 1002  WBC 9.1 9.6 10.1 9.1  HGB 7.5* 7.2* 7.1* 7.4*  HCT 23.8*  22.4* 23.0* 22.4* 24.0*  MCV 86.5 87.8 88.2 89.2  PLT 345 359 393 433*   Basic Metabolic Panel: Recent Labs  Lab 12/30/17 0534 12/31/17 0532 01/02/18 0611 01/05/18 0522  NA 135 136 137  --   K 4.0 4.1 4.0  --   CL 106 105 103  --   CO2 22 23 26   --   GLUCOSE 109* 107* 113*  --   BUN 15 15 16   --   CREATININE 0.73 0.73 0.55* 0.63  CALCIUM 7.8* 7.9* 8.1*  --    GFR: Estimated Creatinine Clearance: 139.9 mL/min (by C-G formula based on SCr of 0.63 mg/dL). Liver Function Tests: Recent Labs  Lab 12/30/17 0534  AST 83*  ALT 42  ALKPHOS 215*  BILITOT 0.7  PROT 6.6  ALBUMIN 1.6*   No results for input(s): LIPASE, AMYLASE in the last 168 hours. No results for input(s): AMMONIA in the  last 168 hours. Coagulation Profile: No results for input(s): INR, PROTIME in the last 168 hours. Cardiac Enzymes: No results for input(s): CKTOTAL, CKMB, CKMBINDEX, TROPONINI in the last 168 hours. BNP (last 3 results) No results for input(s): PROBNP in the last 8760 hours. HbA1C: No results for input(s): HGBA1C in the last 72 hours. CBG: No results for input(s): GLUCAP in the last 168 hours. Lipid Profile: No results for input(s): CHOL, HDL, LDLCALC, TRIG, CHOLHDL, LDLDIRECT in the last 72 hours. Thyroid Function Tests: No results for input(s): TSH, T4TOTAL, FREET4, T3FREE, THYROIDAB in the last 72 hours. Anemia Panel: No results for input(s): VITAMINB12, FOLATE, FERRITIN, TIBC, IRON, RETICCTPCT in the last 72 hours. Sepsis Labs: No results for input(s): PROCALCITON, LATICACIDVEN in the last 168 hours.  No results found for this or any previous visit (from the past 240 hour(s)).  Radiology Studies: No results found.  Scheduled Meds: . cyclobenzaprine  10 mg Oral TID  . feeding supplement (ENSURE ENLIVE)  237 mL Oral QID  . feeding supplement (PRO-STAT SUGAR FREE 64)  30 mL Oral TID  . heparin injection (subcutaneous)  5,000  Units Subcutaneous Q8H  . lidocaine  1 patch Transdermal Q24H  . nicotine  21 mg Transdermal Daily  . nystatin  5 mL Oral QID  . saccharomyces boulardii  250 mg Oral BID  . senna-docusate  1 tablet Oral QHS  . sodium chloride flush  10-40 mL Intracatheter Q12H  . sodium chloride flush  3 mL Intravenous Q12H  . vitamin C  500 mg Oral Daily   Continuous Infusions: . sodium chloride 10 mL/hr at 01/02/18 0458  .  ceFAZolin (ANCEF) IV 2 g (01/05/18 1347)    LOS: 13 days   Time spent: 35 minutes  Erick BlinksJehanzeb Michalene Debruler, MD [facility] Pager [phone]  If 7PM-7AM, please contact night-coverage [email or web address] Password [email or web address] 01/05/2018, 5:37 PM

## 2018-01-05 NOTE — Progress Notes (Signed)
Physical Therapy Treatment Patient Details Name: Howard Diaz MRN: [id number] DOB: 1986/01/01 Today's Date: 01/05/2018    History of Present Illness 32 yo male with onset of sepsis after onset of osteomyelitis on lumbar spine from paraspinous abscess.  Has acute hepatic toxicity and lactic acidosis, AKI, new PICC line for ABT.  PMHx:  EtOH abuse, Hepatitis C, IV drug use,     PT Comments    Patient had difficulty for supine to sitting with head of bed lowered and without use of bed rails, demonstrated fair return for rolling to side and sitting up from side lying position, limited mostly due to increasing low back pain.  Good tolerance and return for completing additional standing exercises using RW without loss of balance and low back pain near baseline.  Patient tolerated sitting at bedside with his mother in room after therapy.  Patient will benefit from continued physical therapy in hospital and recommended venue below to increase strength, balance, endurance for safe ADLs and gait.   Follow Up Recommendations  SNF;Supervision/Assistance - 24 hour;Supervision for mobility/OOB     Equipment Recommendations  Rolling walker with 5" wheels    Recommendations for Other Services       Precautions / Restrictions Precautions Precautions: Fall Restrictions Weight Bearing Restrictions: No    Mobility  Bed Mobility Overal bed mobility: Needs Assistance Bed Mobility: Rolling;Sit to Sidelying Rolling: Min guard       Sit to sidelying: Min guard General bed mobility comments: without use of bed rail, head of bed slightly raised  Transfers Overall transfer level: Needs assistance Equipment used: Rolling walker (2 wheeled) Transfers: Sit to/from [employer] Sit to Stand: Supervision Stand pivot transfers: Supervision       General transfer comment: labored movement  Ambulation/Gait Ambulation/Gait assistance: Supervision Gait Distance (Feet): 150  Feet Assistive device: Rolling walker (2 wheeled) Gait Pattern/deviations: Decreased step length - right;Decreased step length - left;Decreased stride length Gait velocity: decreased   General Gait Details: slightly labored cadence with near normal speed, narrow base of support, short step/stride length, good return for going up/down ramps without loss of balance   Stairs             Wheelchair Mobility    Modified Rankin (Stroke Patients Only)       Balance Overall balance assessment: Needs assistance Sitting-balance support: Feet supported;No upper extremity supported Sitting balance-Leahy Scale: Good     Standing balance support: During functional activity;Bilateral upper extremity supported Standing balance-Leahy Scale: Fair Standing balance comment: using RW                            Cognition Arousal/Alertness: Awake/alert Behavior During Therapy: WFL for tasks assessed/performed Overall Cognitive Status: Within Functional Limits for tasks assessed                                        Exercises General Exercises - Lower Extremity Long Arc Quad: Seated;AROM;Strengthening;Both;10 reps Hip Flexion/Marching: Seated;AROM;Both;Strengthening;5 reps Toe Raises: Seated;AROM;Strengthening;Both;10 reps Heel Raises: AROM;Strengthening;Both;Seated;Standing;20 reps Mini-Sqauts: Standing;AROM;Strengthening;Both;20 reps    General Comments        Pertinent Vitals/Pain Pain Assessment: Faces Faces Pain Scale: Hurts little more Pain Location: low back Pain Descriptors / Indicators: Sore;Discomfort Pain Intervention(s): Limited activity within patient's tolerance;Monitored during session    Home Living  Prior Function            PT Goals (current goals can now be found in the care plan section) Acute Rehab PT Goals Patient Stated Goal: to get home PT Goal Formulation: With patient/family Time For Goal  Achievement: 01/13/18 Potential to Achieve Goals: Good Progress towards PT goals: Progressing toward goals    Frequency    Min 3X/week      PT Plan Current plan remains appropriate    Co-evaluation              AM-PAC PT "6 Clicks" Daily Activity  Outcome Measure  Difficulty turning over in bed (including adjusting bedclothes, sheets and blankets)?: A Little Difficulty moving from lying on back to sitting on the side of the bed? : Unable Difficulty sitting down on and standing up from a chair with arms (e.g., wheelchair, bedside commode, etc,.)?: A Little Help needed moving to and from a bed to chair (including a wheelchair)?: A Little Help needed walking in hospital room?: A Little Help needed climbing 3-5 steps with a railing? : A Lot 6 Click Score: 15    End of Session Equipment Utilized During Treatment: Gait belt;Back brace Activity Tolerance: Patient tolerated treatment well;Patient limited by pain Patient left: in bed;with call bell/phone within reach;with family/visitor present(seated at bedside) Nurse Communication: Mobility status PT Visit Diagnosis: Unsteadiness on feet (R26.81);Muscle weakness (generalized) (M62.81);Pain;Other abnormalities of gait and mobility (R26.89)     Time: [id number] PT Time Calculation (min) (ACUTE ONLY): 27 min  Charges:  $Gait Training: 8-22 mins $Therapeutic Exercise: 8-22 mins                     3:54 PM, 01/05/18 Ocie BobJames Asad Diaz, MPT Physical Therapist [facility] [phone] office [phone] mobile phone

## 2018-01-06 NOTE — Progress Notes (Signed)
Patient discussed with Dr Kerry HoughMemon, echo reviewed. Septal TV valve leaflet vegetation 2 x 1.5 cm at the base of the leaflet, not overally mobile aside from the normal motion of the TV. Mild to mod TR. Initial imaging did show evidence of pulmonary septic emboli. MSSA bacteremia, repeat cultures have cleared. I dont' see a strong indication to consider surgical intervention based on guideline review (absence of severe TR and CHF, sustained infection/treatment failure, recurrent pulmonary emboli on abx therapy, or very large vegetation > 2cm), would continue medical therapy. Particularly higher risk for valve surgery complications due to IV drug use.    Dina RichJonathan Styles Fambro MD

## 2018-01-06 NOTE — Progress Notes (Signed)
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  Assessment & Plan:   Principal Problem:   Acute osteomyelitis of lumbar spine (HCC) Active Problems:   IV drug user   Hepatitis-C   Lactic acid acidosis   Sepsis (HCC)   Hypokalemia   Tobacco abuse   Protein-calorie malnutrition, severe  MSSA tricuspid valve endocarditis: Likely secondary to IV drug use.  Tricuspid valve vegetation noted on echo on 12/24/2017.  Unfortunately the patient is not a candidate for home antibiotics due to his ongoing drug use. - Continue IV cefazolin started 12/25/2017 to continue thru 02/17/18.  - Blood cultures 12/23/2017 growing out MSSA - Blood cultures from 12/25/2017 no growth to date,  PICC line placed on 11/11 - ID consult in chart - Cardiology consulted, no plan for TEE, since vegetation seen on TTE  Septic pulmonary emboli: Noted on CT of the chest.  Patient continues to have small volume hemoptysis.  Anemia: Stable at this time.  Likely multifactorial including bone marrow suppression from sepsis, anemia of chronic disease and iron deficiency anemia based on lab work -Supplement with [employer] given.  Following.  Transfuse for Hg <7.   Abdominal pain/diarrhea: Resolved.    Left shoulder myositis and septic bursitis: Noted on MRI on 12/25/2017. -Orthopedic surgery does not recommend any aspiration as there is too little fluid  L3/4 osteomyelitis, paraspinous phlegmon, right psoas muscle abscess: All likely secondary to endocarditis.  The abscess is not significant sufficiently large for drainage at this time. -Antibiotics per above -Continue pain  control  IV drug use:  -Counseling provided -Patient does not want any medication for withdrawal, however he remains on IV morphine for severe back pain a/w discitis.  Plan to taper pain medications  Chronic hepatitis C: -Outpatient treatment   DVT prophylaxis: Heparin Code Status: Full Family Communication: Mother at bedside Disposition Plan: Continue IV Ancef through 12/31 for full 8-week course of treatment as recommended initially by ID.  PICC line has been placed and waiting for Medicaid approval and facility placement.  Per CM team, pt may have to stay in hospital for full treatment course.   Consultants:   ID  Cardiology  Orthopedic surgery  Procedures:  Echo 12/24/2017:- Left ventricle: The cavity size was normal. Wall thickness was normal. Systolic function was normal. The estimated ejection fraction was in the range of 55% to 60%. Wall motion was normal; there were no regional wall motion abnormalities. - Mitral valve: There was trivial regurgitation. - Right atrium: Central venous pressure (est): 3 mm Hg. - Atrial septum: No defect or patent foramen ovale was identified. - Tricuspid valve: Large, homogeneous echodensity associated with the atrial side of the septal tricuspid leaflet consistent with vegetation in the setting of documented bacteremia. There was moderate regurgitation. - Pulmonary arteries: PA peak pressure: 28 mm Hg (S). - Pericardium, extracardiac: A moderate pericardial effusion was identified posterior to the heart.  Antimicrobials:   IV vancomycin started 12/23/2017 to 12/25/2017  IV aztreonam 12/23/2017 to 12/24/2017  IV cefazolin started 12/25/2017 to ongoing   Subjective: Continues to complain of back pain  Objective: Vitals:   01/05/18 2039 01/05/18 2137 01/06/18 0426 01/06/18 1418  BP:  127/79 134/83  114/70  Pulse:  88 75 85  Resp:   18 17  Temp:  (!) 100.4 F (38 C) 98.7 F (37.1 C) 98.4 F (36.9 C)  TempSrc:   Oral Oral Oral  SpO2: 98% 96% 97% 98%  Weight:      Height:        Intake/Output Summary (Last 24 hours) at 01/06/2018 1635 Last data filed at 01/06/2018 1236 Gross per 24 hour  Intake 1210 ml  Output 2900 ml  Net -1690 ml   Filed Weights   12/23/17 0931 12/23/17 1627 01/01/18 1700  Weight: 81.6 kg 77.4 kg 78.7 kg    Examination:  General exam: Alert, awake, oriented x 3 Respiratory system: Clear to auscultation. Respiratory effort normal. Cardiovascular system:RRR. No murmurs, rubs, gallops. Gastrointestinal system: Abdomen is nondistended, soft and nontender. No organomegaly or masses felt. Normal bowel sounds heard. Central nervous system: Alert and oriented. No focal neurological deficits. Extremities: No C/C/E, +pedal pulses Skin: No rashes, lesions or ulcers Psychiatry: Judgement and insight appear normal. Mood & affect appropriate.    Data Reviewed: I have personally reviewed following labs and imaging studies  CBC: Recent Labs  Lab 12/31/17 0532 01/02/18 0611 01/04/18 1002  WBC 9.6 10.1 9.1  HGB 7.2* 7.1* 7.4*  HCT 23.0* 22.4* 24.0*  MCV 87.8 88.2 89.2  PLT 359 393 433*   Basic Metabolic Panel: Recent Labs  Lab 12/31/17 0532 01/02/18 0611 01/05/18 0522  NA 136 137  --   K 4.1 4.0  --   CL 105 103  --   CO2 23 26  --   GLUCOSE 107* 113*  --   BUN 15 16  --   CREATININE 0.73 0.55* 0.63  CALCIUM 7.9* 8.1*  --    GFR: Estimated Creatinine Clearance: 139.9 mL/min (by C-G formula based on SCr of 0.63 mg/dL). Liver Function Tests: No results for input(s): AST, ALT, ALKPHOS, BILITOT, PROT, ALBUMIN in the last 168 hours. No results for input(s): LIPASE, AMYLASE in the last 168 hours. No results for input(s): AMMONIA in the last 168 hours. Coagulation Profile: No results for input(s): INR, PROTIME in the last 168 hours. Cardiac Enzymes: No results for input(s): CKTOTAL, CKMB, CKMBINDEX, TROPONINI in the last 168 hours. BNP (last 3 results) No  results for input(s): PROBNP in the last 8760 hours. HbA1C: No results for input(s): HGBA1C in the last 72 hours. CBG: No results for input(s): GLUCAP in the last 168 hours. Lipid Profile: No results for input(s): CHOL, HDL, LDLCALC, TRIG, CHOLHDL, LDLDIRECT in the last 72 hours. Thyroid Function Tests: No results for input(s): TSH, T4TOTAL, FREET4, T3FREE, THYROIDAB in the last 72 hours. Anemia Panel: No results for input(s): VITAMINB12, FOLATE, FERRITIN, TIBC, IRON, RETICCTPCT in the last 72 hours. Sepsis Labs: No results for input(s): PROCALCITON, LATICACIDVEN in the last 168 hours.  No results found for this or any previous visit (from the past 240 hour(s)).  Radiology Studies: No results found.  Scheduled Meds: . cyclobenzaprine  10 mg Oral TID  . feeding supplement (ENSURE ENLIVE)  237 mL Oral QID  . feeding supplement (PRO-STAT SUGAR FREE 64)  30 mL Oral TID  . heparin injection (subcutaneous)  5,000 Units Subcutaneous Q8H  . lidocaine  1 patch Transdermal Q24H  . nicotine  21 mg Transdermal Daily  . nystatin  5 mL Oral QID  . saccharomyces boulardii  250 mg Oral BID  . senna-docusate  1 tablet Oral QHS  . sodium chloride flush  10-40 mL  Intracatheter Q12H  . sodium chloride flush  3 mL Intravenous Q12H  . vitamin C  500 mg Oral Daily   Continuous Infusions: . sodium chloride 10 mL/hr at 01/02/18 0458  .  ceFAZolin (ANCEF) IV 2 g (01/06/18 1313)    LOS: 14 days   Time spent: 35 minutes  Erick Blinks, MD [facility] Pager [phone]  If 7PM-7AM, please contact night-coverage [email or web address] Password [facility] 01/06/2018, 4:35 PM

## 2018-01-07 LAB — CBC
HCT: 27.3 % — ABNORMAL LOW (ref 39.0–52.0)
Hemoglobin: 8.5 g/dL — ABNORMAL LOW (ref 13.0–17.0)
MCH: 28.1 pg (ref 26.0–34.0)
MCHC: 31.1 g/dL (ref 30.0–36.0)
MCV: 90.1 fL (ref 80.0–100.0)
PLATELETS: 515 10*3/uL — AB (ref 150–400)
RBC: 3.03 MIL/uL — AB (ref 4.22–5.81)
RDW: 14.8 % (ref 11.5–15.5)
WBC: 10.2 10*3/uL (ref 4.0–10.5)
nRBC: 0 % (ref 0.0–0.2)

## 2018-01-07 LAB — CREATININE, SERUM
Creatinine, Ser: 0.54 mg/dL — ABNORMAL LOW (ref 0.61–1.24)
GFR calc Af Amer: >60 mL/min (ref >60)

## 2018-01-07 NOTE — Progress Notes (Signed)
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  Assessment & Plan:   Principal Problem:   Acute osteomyelitis of lumbar spine (HCC) Active Problems:   IV drug user   Hepatitis-C   Lactic acid acidosis   Sepsis (HCC)   Hypokalemia   Tobacco abuse   Protein-calorie malnutrition, severe  MSSA tricuspid valve endocarditis: Likely secondary to IV drug use.  Tricuspid valve vegetation noted on echo on 12/24/2017.  Unfortunately the patient is not a candidate for home antibiotics due to his ongoing drug use. - Continue IV cefazolin started 12/25/2017 to continue thru 02/17/18.  - Blood cultures 12/23/2017 growing out MSSA - Blood cultures from 12/25/2017 no growth to date,  PICC line placed on 11/11 - ID consult in chart - Cardiology consulted, no plan for TEE, since vegetation seen on TTE  Septic pulmonary emboli: Noted on CT of the chest.  Patient continues to have small volume hemoptysis.  Anemia: Stable at this time.  Likely multifactorial including bone marrow suppression from sepsis, anemia of chronic disease and iron deficiency anemia based on lab work -Supplement with [employer] given.  Following.  Transfuse for Hg <7.   Abdominal pain/diarrhea: Resolved.    Left shoulder myositis and septic bursitis: Noted on MRI on 12/25/2017. -Orthopedic surgery does not recommend any aspiration as there is too little fluid  L3/4 osteomyelitis, paraspinous phlegmon, right psoas muscle abscess: All likely secondary to endocarditis.  The abscess is not significant sufficiently large for drainage at this time. -Antibiotics per above -Continue pain  control  IV drug use:  -Counseling provided -Patient does not want any medication for withdrawal, however he remains on IV morphine for severe back pain a/w discitis.  Plan to taper pain medications  Chronic hepatitis C: -Outpatient treatment   DVT prophylaxis: Heparin Code Status: Full Family Communication: Mother at bedside Disposition Plan: Continue IV Ancef through 12/31 for full 8-week course of treatment as recommended initially by ID.  PICC line has been placed and waiting for Medicaid approval and facility placement.  Per CM team, pt may have to stay in hospital for full treatment course.   Consultants:   ID  Cardiology  Orthopedic surgery  Procedures:  Echo 12/24/2017:- Left ventricle: The cavity size was normal. Wall thickness was normal. Systolic function was normal. The estimated ejection fraction was in the range of 55% to 60%. Wall motion was normal; there were no regional wall motion abnormalities. - Mitral valve: There was trivial regurgitation. - Right atrium: Central venous pressure (est): 3 mm Hg. - Atrial septum: No defect or patent foramen ovale was identified. - Tricuspid valve: Large, homogeneous echodensity associated with the atrial side of the septal tricuspid leaflet consistent with vegetation in the setting of documented bacteremia. There was moderate regurgitation. - Pulmonary arteries: PA peak pressure: 28 mm Hg (S). - Pericardium, extracardiac: A moderate pericardial effusion was identified posterior to the heart.  Antimicrobials:   IV vancomycin started 12/23/2017 to 12/25/2017  IV aztreonam 12/23/2017 to 12/24/2017  IV cefazolin started 12/25/2017 to ongoing   Subjective: Reports continued back pain.  He spends most of his time in bed.  Objective: Vitals:   01/06/18 2111 01/06/18 2117 01/07/18 9562  01/07/18 1511  BP:  116/72 107/60 107/68  Pulse:  81 83 75  Resp:      Temp:  99.8 F (37.7 C) 99.5 F (37.5 C) 98.9 F  (37.2 C)  TempSrc:  Oral Oral Oral  SpO2: 98% 97% 98% 98%  Weight:      Height:        Intake/Output Summary (Last 24 hours) at 01/07/2018 1810 Last data filed at 01/07/2018 1400 Gross per 24 hour  Intake -  Output 550 ml  Net -550 ml   Filed Weights   12/23/17 0931 12/23/17 1627 01/01/18 1700  Weight: 81.6 kg 77.4 kg 78.7 kg    Examination:  General exam: Alert, awake, oriented x 3 Respiratory system: Clear to auscultation. Respiratory effort normal. Cardiovascular system:RRR. No murmurs, rubs, gallops. Gastrointestinal system: Abdomen is nondistended, soft and nontender. No organomegaly or masses felt. Normal bowel sounds heard. Central nervous system: Alert and oriented. No focal neurological deficits. Extremities: No C/C/E, +pedal pulses Skin: No rashes, lesions or ulcers Psychiatry: Judgement and insight appear normal. Mood & affect appropriate.     Data Reviewed: I have personally reviewed following labs and imaging studies  CBC: Recent Labs  Lab 01/02/18 0611 01/04/18 1002 01/07/18 0538  WBC 10.1 9.1 10.2  HGB 7.1* 7.4* 8.5*  HCT 22.4* 24.0* 27.3*  MCV 88.2 89.2 90.1  PLT 393 433* 515*   Basic Metabolic Panel: Recent Labs  Lab 01/02/18 0611 01/05/18 0522 01/07/18 0538  NA 137  --   --   K 4.0  --   --   CL 103  --   --   CO2 26  --   --   GLUCOSE 113*  --   --   BUN 16  --   --   CREATININE 0.55* 0.63 0.54*  CALCIUM 8.1*  --   --    GFR: Estimated Creatinine Clearance: 139.9 mL/min (A) (by C-G formula based on SCr of 0.54 mg/dL (L)). Liver Function Tests: No results for input(s): AST, ALT, ALKPHOS, BILITOT, PROT, ALBUMIN in the last 168 hours. No results for input(s): LIPASE, AMYLASE in the last 168 hours. No results for input(s): AMMONIA in the last 168 hours. Coagulation Profile: No results for input(s): INR, PROTIME in the last 168 hours. Cardiac Enzymes: No results for input(s): CKTOTAL, CKMB, CKMBINDEX, TROPONINI in the last 168  hours. BNP (last 3 results) No results for input(s): PROBNP in the last 8760 hours. HbA1C: No results for input(s): HGBA1C in the last 72 hours. CBG: No results for input(s): GLUCAP in the last 168 hours. Lipid Profile: No results for input(s): CHOL, HDL, LDLCALC, TRIG, CHOLHDL, LDLDIRECT in the last 72 hours. Thyroid Function Tests: No results for input(s): TSH, T4TOTAL, FREET4, T3FREE, THYROIDAB in the last 72 hours. Anemia Panel: No results for input(s): VITAMINB12, FOLATE, FERRITIN, TIBC, IRON, RETICCTPCT in the last 72 hours. Sepsis Labs: No results for input(s): PROCALCITON, LATICACIDVEN in the last 168 hours.  No results found for this or any previous visit (from the past 240 hour(s)).  Radiology Studies: No results found.  Scheduled Meds: . cyclobenzaprine  10 mg Oral TID  . feeding supplement (ENSURE ENLIVE)  237 mL Oral QID  . feeding supplement (PRO-STAT SUGAR FREE 64)  30 mL Oral TID  . heparin injection (subcutaneous)  5,000 Units Subcutaneous Q8H  . lidocaine  1 patch Transdermal Q24H  . nicotine  21 mg Transdermal Daily  . nystatin  5 mL Oral QID  . saccharomyces  boulardii  250 mg Oral BID  . senna-docusate  1 tablet Oral QHS  . sodium chloride flush  10-40 mL Intracatheter Q12H  . sodium chloride flush  3 mL Intravenous Q12H  . vitamin C  500 mg Oral Daily   Continuous Infusions: . sodium chloride 10 mL/hr at 01/02/18 0458  .  ceFAZolin (ANCEF) IV 2 g (01/07/18 1353)    LOS: 15 days   Time spent: 35 minutes  Erick BlinksJehanzeb Rodrick Payson, MD [facility] Pager [phone]  If 7PM-7AM, please contact night-coverage [email or web address] Password [email or web address] 01/07/2018, 6:10 PM

## 2018-01-07 NOTE — Progress Notes (Signed)
PT Cancellation Note  Patient Details Name: Howard Diaz MRN: [id number] DOB: 08/18/1985   Cancelled Treatment:    Reason Eval/Treat Not Completed: Pain limiting ability to participate. Patient reports he walked from his room to nurse's desk and back with his mother last night. In too much pain in pelvis today to participate in PT today.   Katina DungBarbara D. Hartnett-Rands, MS, PT [place] [id number] 01/07/2018, 12:01 PM

## 2018-01-08 DIAGNOSIS — I330 Acute and subacute infective endocarditis: Secondary | ICD-10-CM

## 2018-01-08 DIAGNOSIS — B958 Unspecified staphylococcus as the cause of diseases classified elsewhere: Secondary | ICD-10-CM | POA: Diagnosis present

## 2018-01-08 NOTE — Care Management Note (Signed)
Case Management Note  Patient Details  Name: Howard Diaz MRN: [id number] Date of Birth: 12/22/1985   If discussed at Long Length of Stay Meetings, dates discussed:  01/08/18  Additional Comments: No determined pt has out of state medicaid. CSW consult placed for placement under medicaid to complete IV therapy at SNF. [name], [name], RN 01/08/2018, 1:06 PM

## 2018-01-08 NOTE — Progress Notes (Signed)
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  Assessment & Plan:   Principal Problem:   Acute osteomyelitis of lumbar spine (HCC) Active Problems:   IV drug user   Hepatitis-C   Lactic acid acidosis   Sepsis (HCC)   Hypokalemia   Tobacco abuse   Protein-calorie malnutrition, severe  MSSA tricuspid valve endocarditis: Likely secondary to IV drug use.  Tricuspid valve vegetation noted on echo on 12/24/2017.  Unfortunately the patient is not a candidate for home antibiotics due to his ongoing drug use. - Continue IV cefazolin started 12/25/2017 to continue thru 02/17/18.  - Blood cultures 12/23/2017 growing out MSSA - Blood cultures from 12/25/2017 no growth to date,  PICC line placed on 11/11 - ID consult in chart - Cardiology consulted, no plan for TEE, since vegetation seen on TTE -We will likely need infectious disease follow-up after discharge prior to discontinuing antibiotics.  Septic pulmonary emboli: Noted on CT of the chest.  Patient continues to have small volume hemoptysis.  Anemia: Stable at this time.  Likely multifactorial including bone marrow suppression from sepsis, anemia of chronic disease and iron deficiency anemia based on lab work -Supplement with [employer] given.  Following.  Transfuse for Hg <7.   Abdominal pain/diarrhea: Resolved.    Left shoulder myositis and septic bursitis: Noted on MRI on 12/25/2017. -Orthopedic surgery does not recommend any aspiration as there is too little fluid  L3/4 osteomyelitis, paraspinous phlegmon, right psoas muscle abscess: All likely secondary to endocarditis.  The abscess is  not significant sufficiently large for drainage at this time. -Antibiotics per above -Continue pain control  IV drug use:  -Counseling provided -Patient does not want any medication for withdrawal, however he remains on IV morphine for severe back pain a/w discitis.  Plan to taper pain medications  Chronic hepatitis C: -Outpatient treatment   DVT prophylaxis: Heparin Code Status: Full Family Communication: Mother at bedside Disposition Plan: Continue IV Ancef through 12/31 for full 8-week course of treatment as recommended initially by ID.  PICC line has been placed and waiting for Medicaid approval and facility placement.  Per CM team, pt may have to stay in hospital for full treatment course.   Consultants:   ID  Cardiology  Orthopedic surgery  Procedures:  Echo 12/24/2017:- Left ventricle: The cavity size was normal. Wall thickness was normal. Systolic function was normal. The estimated ejection fraction was in the range of 55% to 60%. Wall motion was normal; there were no regional wall motion abnormalities. - Mitral valve: There was trivial regurgitation. - Right atrium: Central venous pressure (est): 3 mm Hg. - Atrial septum: No defect or patent foramen ovale was identified. - Tricuspid valve: Large, homogeneous echodensity associated with the atrial side of the septal tricuspid leaflet consistent with vegetation in the setting of documented bacteremia. There was moderate regurgitation. - Pulmonary arteries: PA peak pressure: 28 mm Hg (S). - Pericardium, extracardiac: A moderate pericardial effusion was identified posterior to the heart.  Antimicrobials:   IV vancomycin started 12/23/2017 to 12/25/2017  IV aztreonam 12/23/2017 to 12/24/2017  IV cefazolin started 12/25/2017 to ongoing   Subjective: No new complaints.  Continues to have back pain, but able  to ambulate more now.  Objective: Vitals:   01/07/18 1511 01/07/18 2256 01/08/18 0559 01/08/18  1431  BP: 107/68 (!) 110/58 107/62 109/65  Pulse: 75 93 90 86  Resp:  20 18 17   Temp: 98.9 F (37.2 C) 98.7 F (37.1 C) 98.3 F (36.8 C)   TempSrc: Oral Oral Oral   SpO2: 98% 97% 98% 98%  Weight:      Height:        Intake/Output Summary (Last 24 hours) at 01/08/2018 2002 Last data filed at 01/08/2018 1700 Gross per 24 hour  Intake 730 ml  Output 1300 ml  Net -570 ml   Filed Weights   12/23/17 0931 12/23/17 1627 01/01/18 1700  Weight: 81.6 kg 77.4 kg 78.7 kg    Examination:  General exam: Alert, awake, oriented x 3 Respiratory system: Clear to auscultation. Respiratory effort normal. Cardiovascular system:RRR. No murmurs, rubs, gallops. Gastrointestinal system: Abdomen is nondistended, soft and nontender. No organomegaly or masses felt. Normal bowel sounds heard. Central nervous system: Alert and oriented. No focal neurological deficits. Extremities: No C/C/E, +pedal pulses Skin: No rashes, lesions or ulcers Psychiatry: Judgement and insight appear normal. Mood & affect appropriate.    Data Reviewed: I have personally reviewed following labs and imaging studies  CBC: Recent Labs  Lab 01/02/18 0611 01/04/18 1002 01/07/18 0538  WBC 10.1 9.1 10.2  HGB 7.1* 7.4* 8.5*  HCT 22.4* 24.0* 27.3*  MCV 88.2 89.2 90.1  PLT 393 433* 515*   Basic Metabolic Panel: Recent Labs  Lab 01/02/18 0611 01/05/18 0522 01/07/18 0538  NA 137  --   --   K 4.0  --   --   CL 103  --   --   CO2 26  --   --   GLUCOSE 113*  --   --   BUN 16  --   --   CREATININE 0.55* 0.63 0.54*  CALCIUM 8.1*  --   --    GFR: Estimated Creatinine Clearance: 139.9 mL/min (A) (by C-G formula based on SCr of 0.54 mg/dL (L)). Liver Function Tests: No results for input(s): AST, ALT, ALKPHOS, BILITOT, PROT, ALBUMIN in the last 168 hours. No results for input(s): LIPASE, AMYLASE in the last 168 hours. No results for input(s): AMMONIA in the last 168 hours. Coagulation Profile: No results for  input(s): INR, PROTIME in the last 168 hours. Cardiac Enzymes: No results for input(s): CKTOTAL, CKMB, CKMBINDEX, TROPONINI in the last 168 hours. BNP (last 3 results) No results for input(s): PROBNP in the last 8760 hours. HbA1C: No results for input(s): HGBA1C in the last 72 hours. CBG: No results for input(s): GLUCAP in the last 168 hours. Lipid Profile: No results for input(s): CHOL, HDL, LDLCALC, TRIG, CHOLHDL, LDLDIRECT in the last 72 hours. Thyroid Function Tests: No results for input(s): TSH, T4TOTAL, FREET4, T3FREE, THYROIDAB in the last 72 hours. Anemia Panel: No results for input(s): VITAMINB12, FOLATE, FERRITIN, TIBC, IRON, RETICCTPCT in the last 72 hours. Sepsis Labs: No results for input(s): PROCALCITON, LATICACIDVEN in the last 168 hours.  No results found for this or any previous visit (from the past 240 hour(s)).  Radiology Studies: No results found.  Scheduled Meds: . cyclobenzaprine  10 mg Oral TID  . feeding supplement (ENSURE ENLIVE)  237 mL Oral QID  . feeding supplement (PRO-STAT SUGAR FREE 64)  30 mL Oral TID  . heparin injection (subcutaneous)  5,000 Units Subcutaneous Q8H  . lidocaine  1 patch Transdermal Q24H  . nicotine  21 mg Transdermal Daily  . nystatin  5 mL Oral QID  . saccharomyces boulardii  250 mg Oral BID  . senna-docusate  1 tablet Oral QHS  . sodium chloride flush  10-40 mL Intracatheter Q12H  . sodium chloride flush  3 mL Intravenous Q12H  . vitamin C  500 mg Oral Daily   Continuous Infusions: . sodium chloride 10 mL/hr at 01/02/18 0458  .  ceFAZolin (ANCEF) IV 2 g (01/08/18 1533)    LOS: 16 days   Time spent: 15 minutes  Erick Blinks, MD [facility] Pager [phone]  If 7PM-7AM, please contact night-coverage [email or web address] Password [email or web address] 01/08/2018, 8:02 PM

## 2018-01-08 NOTE — Progress Notes (Signed)
Pharmacy Antibiotic Note  Howard Diaz is a 32 y.o. male admitted on 12/23/2017 with MSSA bacteremia/endocarditis.  Pharmacy has been consulted for cefazolin dosing.  Per patient, he has tolerated keflex in the past despite penicillin allergy. 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 Tricuspid valve vegetation noted on echo on 12/24/2017. Marland Kitchen. BCX from 11/7  remain negative.   Plan: Continue Cefazolin 2000 mg IV every 8 hours. Per ID, 8 weeks cefazolin (12/25/2017 to continue thru 02/17/18) Monitor labs weekly  Height: 5\' 11"  (180.3 cm) Weight: 173 lb 6.4 oz (78.7 kg) IBW/kg (Calculated) : 75.3  Temp (24hrs), Avg:98.6 F (37 C), Min:98.3 F (36.8 C), Max:98.9 F (37.2 C)  Recent Labs  Lab 01/02/18 0611 01/04/18 1002 01/05/18 0522 01/07/18 0538  WBC 10.1 9.1  --  10.2  CREATININE 0.55*  --  0.63 0.54*    Estimated Creatinine Clearance: 139.9 mL/min (A) (by C-G formula based on SCr of 0.54 mg/dL (L)).    Allergies  Allergen Reactions  . Penicillins Anaphylaxis    Has patient had a PCN reaction causing immediate rash, facial/tongue/throat swelling, SOB or lightheadedness with hypotension: Yes Has patient had a PCN reaction causing severe rash involving mucus membranes or skin necrosis: Yes Has patient had a PCN reaction that required hospitalization:Yes Has patient had a PCN reaction occurring within the last 10 years: No If all of the above answers are "NO", then may proceed with Cephalosporin use.     Antimicrobials this admission: Vanco 11/5 >> 11/7 Aztreonam 11/5 >> 11/6 Levaquin 11/5 >>11/5 Cefazolin 11/7 >>  Microbiology results: 11/7 BXx: ng 11/5 BCx: MSSA 11/5 UCx: ng  Thank you for allowing pharmacy to be a part of this patient's care.  Elder CyphersLorie Holdan Stucke, BS Pharm D, [place] Clinical Pharmacist Pager [phone] 01/08/2018 10:18 AM

## 2018-01-08 NOTE — Clinical Social Work Note (Signed)
Clinical Social Work Assessment  Patient Details  Name: Howard Diaz MRN: [id number] Date of Birth: Apr 03, 1985  Date of referral:  01/08/18               Reason for consult:  Facility Placement                Permission sought to share information with:    Permission granted to share information::     Name::        Agency::     Relationship::     Contact Information:     Housing/Transportation Living arrangements for the past 2 months:  Barrister's clerk of Information:  Patient Patient Interpreter Needed:  None Criminal Activity/Legal Involvement Pertinent to Current Situation/Hospitalization:  No - Comment as needed Significant Relationships:  Parents Lives with:  Self Do you feel safe going back to the place where you live?  Yes(Patient plans on going to live with his mother at discharge.) Need for family participation in patient care:  Yes (Comment)  Care giving concerns:  None identified.    Social Worker assessment / plan:  LCSW met with patient and discussed SNF in [place] to complete IV antibiotics course.  Patient states that he would rather stay in [place] but is agreeable to SNF in [place]. Patient discussed SA hx. He declined SA treatment resources and stated that he felt he would remain sober upon discharge.  LCSW will complete SNF referral.   Employment status:  Unemployed Insurance information:  [employer] PT Recommendations:  [place] / Referral to community resources:  [place]  Patient/Family's Response to care:  Patient is agreeable to SNF.   Patient/Family's Understanding of and Emotional Response to Diagnosis, Current Treatment, and Prognosis:  Patient understands his diagnosis, treatment and prognosis.   Emotional Assessment Appearance:  Appears stated age Attitude/Demeanor/Rapport:    Affect (typically observed):  Accepting Orientation:  Oriented to Self, Oriented to Place, Oriented to   Time, Oriented to Situation Alcohol / Substance use:  Illicit Drugs Psych involvement (Current and /or in the community):  No (Comment)  Discharge Needs  Concerns to be addressed:  No discharge needs identified Readmission within the last 30 days:  No Current discharge risk:  None Barriers to Discharge:  Other(locating a facility tht will take patient with Medicaid only and SA hx. )   Quetzali Heinle D, LCSW 01/08/2018, 1:07 PM

## 2018-01-08 NOTE — Clinical Social Work Placement (Signed)
   CLINICAL SOCIAL WORK PLACEMENT  NOTE  Date:  01/08/2018  Patient Details  Name: Howard Diaz MRN: [id number] Date of Birth: 12/15/1985  Clinical Social Work is seeking post-discharge placement for this patient at the Skilled  Nursing Facility level of care (*CSW will initial, date and re-position this form in  chart as items are completed):  Yes   Patient/family provided with [place] Clinical Social Work Department's list of facilities offering this level of care within the geographic area requested by the patient (or if unable, by the patient's family).  Yes   Patient/family informed of their freedom to choose among providers that offer the needed level of care, that participate in Medicare, Medicaid or managed care program needed by the patient, have an available bed and are willing to accept the patient.  Yes   Patient/family informed of [facility]'s ownership interest in [facility] and [facility], as well as of the fact that they are under no obligation to receive care at these facilities.  PASRR submitted to EDS on       PASRR number received on       Existing PASRR number confirmed on       FL2 transmitted to all facilities in geographic area requested by pt/family on 01/08/18     FL2 transmitted to all facilities within larger geographic area on       Patient informed that his/her managed care company has contracts with or will negotiate with certain facilities, including the following:            Patient/family informed of bed offers received.  Patient chooses bed at       Physician recommends and patient chooses bed at      Patient to be transferred to   on  .  Patient to be transferred to facility by       Patient family notified on   of transfer.  Name of family member notified:        PHYSICIAN       Additional Comment:    _______________________________________________ Annice NeedySettle, Shainna Faux D, LCSW 01/08/2018, 2:09 PM

## 2018-01-08 NOTE — NC FL2 (Signed)
[place] MEDICAID FL2 LEVEL OF CARE SCREENING TOOL     IDENTIFICATION  Patient Name: Howard Diaz Birthdate: 10/25/1985 Sex: male Admission Date (Current Location): 12/23/2017  [place]  [facility] [place] [facility] Enrol[place] #[id number]. Medicaid worker is Ms. Emelda FearFerguson [phone] Facility and Address:  [facility],  [address], [place] [id number]      Provider Number: [id number]  Attending Physician Name and Address:  Erick BlinksMemon, Jehanzeb, MD  Relative Name and Phone Number:       Current Level of Care: Hospital Recommended Level of Care: Skilled Nursing Facility Prior Approval Number:    Date Approved/Denied:   PASRR Number:    Discharge Plan: SNF    Current Diagnoses: Patient Active Problem List   Diagnosis Date Noted  . Protein-calorie malnutrition, severe 01/02/2018  . Osteomyelitis (HCC) 12/23/2017  . IV drug user 12/23/2017  . Acute osteomyelitis of lumbar spine (HCC) 12/23/2017  . Hepatitis-C 12/23/2017  . Lactic acid acidosis 12/23/2017  . Sepsis (HCC) 12/23/2017  . Hypokalemia 12/23/2017  . Tobacco abuse     Orientation RESPIRATION BLADDER Height & Weight     Self, Time, Situation, Place  Normal Continent Weight: 173 lb 6.4 oz (78.7 kg) Height:  5\' 11"  (180.3 cm)  BEHAVIORAL SYMPTOMS/MOOD NEUROLOGICAL BOWEL NUTRITION STATUS      Continent Diet(Regular)  AMBULATORY STATUS COMMUNICATION OF NEEDS Skin   Limited Assist Verbally Normal                       Personal Care Assistance Level of Assistance  Bathing, Feeding, Dressing Bathing Assistance: Independent Feeding assistance: Independent Dressing Assistance: Independent     Functional Limitations Info  Sight, Hearing, Speech Sight Info: Adequate Hearing Info: Adequate Speech Info: Adequate    SPECIAL CARE FACTORS FREQUENCY  PT (By licensed PT)     PT Frequency: 5x/week              Contractures Contractures Info: Not present     Additional Factors Info  Code Status, Allergies Code Status Info: Full Allergies Info: Penicillins           Current Medications (01/08/2018):  This is the current hospital active medication list Current Facility-Administered Medications  Medication Dose Route Frequency Provider Last Rate Last Dose  . 0.9 %  sodium chloride infusion   Intravenous PRN Purohit, Salli QuarryShrey C, MD 10 mL/hr at 01/02/18 0458    . acetaminophen (TYLENOL) tablet 650 mg  650 mg Oral Q8H PRN Vassie LollMadera, Carlos, MD   650 mg at 12/29/17 2132   Or  . acetaminophen (TYLENOL) suppository 650 mg  650 mg Rectal Q8H PRN Vassie LollMadera, Carlos, MD      . ceFAZolin (ANCEF) IVPB 2g/100 mL premix  2 g Intravenous Q8H Purohit, Shrey C, MD 200 mL/hr at 01/08/18 0607 2 g at 01/08/18 0607  . cyclobenzaprine (FLEXERIL) tablet 10 mg  10 mg Oral TID Erick BlinksMemon, Jehanzeb, MD   10 mg at 01/08/18 1018  . feeding supplement (ENSURE ENLIVE) (ENSURE ENLIVE) liquid 237 mL  237 mL Oral QID Johnson, Clanford L, MD   237 mL at 01/08/18 1036  . feeding supplement (PRO-STAT SUGAR FREE 64) liquid 30 mL  30 mL Oral TID Laural BenesJohnson, Clanford L, MD   30 mL at 01/08/18 1018  . heparin injection 5,000 Units  5,000 Units Subcutaneous Q8H Vassie LollMadera, Carlos, MD   5,000 Units at 01/08/18 0603  . lidocaine (LIDODERM) 5 % 1 patch  1 patch Transdermal Q24H  Erick Blinks, MD   1 patch at 01/08/18 1243  . loperamide (IMODIUM) capsule 2 mg  2 mg Oral PRN Sherryll Burger, Pratik D, DO   2 mg at 12/25/17 2107  . morphine 2 MG/ML injection 2 mg  2 mg Intravenous Q6H PRN Erick Blinks, MD   2 mg at 01/06/18 0054  . nicotine (NICODERM CQ - dosed in mg/24 hours) patch 21 mg  21 mg Transdermal Daily Vassie Loll, MD      . nystatin (MYCOSTATIN) 100000 UNIT/ML suspension 500,000 Units  5 mL Oral QID Purohit, Shrey C, MD   500,000 Units at 01/08/18 1018  . ondansetron (ZOFRAN) tablet 4 mg  4 mg Oral Q6H PRN Vassie Loll, MD       Or  . ondansetron [facility]) injection 4 mg  4 mg Intravenous Q6H PRN  Vassie Loll, MD      . oxyCODONE (Oxy IR/ROXICODONE) immediate release tablet 10 mg  10 mg Oral Q4H PRN Purohit, Salli Quarry, MD   10 mg at 01/08/18 1018  . saccharomyces boulardii (FLORASTOR) capsule 250 mg  250 mg Oral BID Vassie Loll, MD   250 mg at 01/08/18 1018  . senna-docusate (Senokot-S) tablet 1 tablet  1 tablet Oral QHS Laural Benes, Clanford L, MD   1 tablet at 01/02/18 2017  . sodium chloride flush (NS) 0.9 % injection 10-40 mL  10-40 mL Intracatheter Q12H Vickki Hearing, MD   10 mL at 01/07/18 2231  . sodium chloride flush (NS) 0.9 % injection 10-40 mL  10-40 mL Intracatheter PRN Vickki Hearing, MD      . sodium chloride flush (NS) 0.9 % injection 3 mL  3 mL Intravenous Q12H Vassie Loll, MD   3 mL at 01/04/18 2209  . vitamin C (ASCORBIC ACID) tablet 500 mg  500 mg Oral Daily Johnson, Clanford L, MD   500 mg at 01/08/18 1018  . zolpidem (AMBIEN) tablet 5 mg  5 mg Oral QHS PRN Sherryll Burger, Pratik D, DO   5 mg at 01/07/18 2230     Discharge Medications: Please see discharge summary for a list of discharge medications.  Relevant Imaging Results:  Relevant Lab Results:   Additional Information PATIENT WILL NEED IV ANTIBIOTICS!!! SSN: [id number]. Continue Cefazolin 2000 mg IV every 8 hours. 8 weeks cefazolin (12/25/2017 to continue thru 02/17/18)  Kijana Estock, Juleen China, LCSW

## 2018-01-09 LAB — CREATININE, SERUM
Creatinine, Ser: 0.58 mg/dL — ABNORMAL LOW (ref 0.61–1.24)
GFR calc Af Amer: >60 mL/min (ref >60)
GFR calc non Af Amer: >60 mL/min (ref >60)

## 2018-01-09 MED ORDER — SENNOSIDES-DOCUSATE SODIUM 8.6-50 MG PO TABS
1.0000 | ORAL_TABLET | Freq: Two times a day (BID) | ORAL | Status: DC
  Administered 2018-01-09 – 2018-01-12 (×5): 1 via ORAL
  Filled 2018-01-09 (×7): qty 1

## 2018-01-09 MED ORDER — CYCLOBENZAPRINE HCL 5 MG PO TABS
7.5000 mg | ORAL_TABLET | Freq: Three times a day (TID) | ORAL | Status: DC
  Administered 2018-01-09 – 2018-01-19 (×30): 7.5 mg via ORAL
  Filled 2018-01-09 (×40): qty 1.5

## 2018-01-09 NOTE — Progress Notes (Signed)
PT Cancellation Note  Patient Details Name: Howard Diaz MRN: [id number] DOB: 09/12/1985   Cancelled Treatment:    Reason Eval/Treat Not Completed: Pain limiting ability to participate.  Patient declined therapy secondary to c/o severe groin pain and left side low back pain after taking shower today - RN notified.   2:36 PM, 01/09/18 Ocie BobJames Karder Goodin, MPT Physical Therapist with [facility] [phone] office [phone] mobile phone

## 2018-01-09 NOTE — Progress Notes (Signed)
PROGRESS NOTE  Howard Diaz  WUJ:[id number] DOB: 28-Sep-1985 DOA: 12/23/2017 PCP: Patient, No Pcp Per  Brief Narrative:  32 year old with past medical history relevant for IV drug use (cocaine and Suboxone), chronic hepatitis C admitted with sepsis and found to have endocarditis of tricuspid valve with metastatic disease to spine along with right psoas muscle abscess and paraspinous phlegmon.  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  Tricuspid valve vegetation noted on echo on 12/24/2017.  Unfortunately the patient is not a candidate for home antibiotics due to his ongoing drug use. - Continue IV cefazolin started 12/25/2017 to continue thru 02/17/18.  - Blood cultures 12/23/2017 growing out MSSA - Blood cultures from 12/25/2017 no growth to date,  PICC line placed on 11/11 - ID consult in chart - Cardiology consulted, no plan for TEE, since vegetation seen on TTE -We will likely need infectious disease follow-up after discharge prior to discontinuing antibiotics.  Septic pulmonary emboli: Noted on CT of the chest.  Patient continues to have small volume hemoptysis.  Iron Deficiency Anemia: Stable at this time.  Likely multifactorial including bone marrow suppression from sepsis, anemia of chronic disease and iron deficiency anemia based on lab work -Supplement with [employer] given.  Following.  Transfuse for Hg <7.   Abdominal pain/diarrhea: Resolved.    Left shoulder myositis and septic bursitis: Noted on MRI on 12/25/2017. -Orthopedic surgery does not recommend any aspiration as there is too little fluid  L3/4 osteomyelitis, paraspinous phlegmon, right psoas muscle abscess:  All likely secondary to endocarditis.  The abscess is not significant sufficiently large for drainage at this time. -Antibiotics per above -Continue pain control  Opioid induced Constipation - Scheduled laxatives ordered.   IV drug use:  -Counseling provided -Patient does not want any medication for withdrawal, however he remains on IV morphine for severe back pain a/w discitis.  Plan to taper pain medications  Chronic hepatitis C: -Outpatient treatment, plan to have patient follow up with Infectious Diseases Clinic after discharge.   DVT prophylaxis: Heparin Code Status: Full Family Communication: Mother at bedside Disposition Plan: Continue IV Ancef through 12/31 for full 8-week course of treatment as recommended initially by ID.  PICC line has been placed and waiting for Medicaid approval and facility placement.  Per CM team, pt may have to stay in hospital for full treatment course.   Consultants:   ID  Cardiology  Orthopedic surgery  Procedures:  Echo 12/24/2017:- Left ventricle: The cavity size was normal. Wall thickness was normal. Systolic function was normal. The estimated ejection fraction was in the range of 55% to 60%. Wall motion was normal; there were no regional wall motion abnormalities. - Mitral valve: There was trivial regurgitation. - Right atrium: Central venous pressure (est): 3 mm Hg. - Atrial septum: No defect or patent foramen ovale was identified. - Tricuspid valve: Large, homogeneous echodensity associated with the atrial side of the septal tricuspid leaflet consistent with vegetation in the setting of documented bacteremia. There was moderate regurgitation. - Pulmonary arteries: PA peak pressure: 28 mm Hg (S). - Pericardium, extracardiac: A moderate pericardial effusion was identified posterior to the heart.  Antimicrobials:   IV vancomycin started 12/23/2017  to 12/25/2017  IV aztreonam 12/23/2017 to 12/24/2017  IV cefazolin  started 12/25/2017 to ongoing  Subjective: No new complaints.  Pt says he was sweating in middle of night, pain is better controlled.  No recent BMs.   Objective: Vitals:   01/08/18 1431 01/08/18 2023 01/08/18 2150 01/09/18 0553  BP: 109/65  109/64 97/63  Pulse: 86  90 85  Resp: 17  16 18   Temp:   98.6 F (37 C) 98 F (36.7 C)  TempSrc:   Oral Oral  SpO2: 98% 97% 97% 99%  Weight:      Height:        Intake/Output Summary (Last 24 hours) at 01/09/2018 0901 Last data filed at 01/08/2018 1700 Gross per 24 hour  Intake 480 ml  Output 1000 ml  Net -520 ml   Filed Weights   12/23/17 0931 12/23/17 1627 01/01/18 1700  Weight: 81.6 kg 77.4 kg 78.7 kg    Examination:  General exam: Alert, awake, oriented x 3 Respiratory system: Clear to auscultation. Respiratory effort normal. Cardiovascular system:RRR. No murmurs, rubs, gallops. Gastrointestinal system: Abdomen is nondistended, soft and nontender. No organomegaly or masses felt. Normal bowel sounds heard. Central nervous system: Alert and oriented. No focal neurological deficits. Extremities: No C/C/E, +pedal pulses Skin: No rashes, lesions or ulcers Psychiatry: Judgement and insight appear normal. Mood & affect appropriate.    Data Reviewed: I have personally reviewed following labs and imaging studies  CBC: Recent Labs  Lab 01/04/18 1002 01/07/18 0538  WBC 9.1 10.2  HGB 7.4* 8.5*  HCT 24.0* 27.3*  MCV 89.2 90.1  PLT 433* 515*   Basic Metabolic Panel: Recent Labs  Lab 01/05/18 0522 01/07/18 0538 01/09/18 0529  CREATININE 0.63 0.54* 0.58*   GFR: Estimated Creatinine Clearance: 139.9 mL/min (A) (by C-G formula based on SCr of 0.58 mg/dL (L)). Liver Function Tests: No results for input(s): AST, ALT, ALKPHOS, BILITOT, PROT, ALBUMIN in the last 168 hours. No results for input(s): LIPASE, AMYLASE in the last 168 hours. No results for input(s): AMMONIA in the last 168 hours. Coagulation Profile: No results for  input(s): INR, PROTIME in the last 168 hours. Cardiac Enzymes: No results for input(s): CKTOTAL, CKMB, CKMBINDEX, TROPONINI in the last 168 hours. BNP (last 3 results) No results for input(s): PROBNP in the last 8760 hours. HbA1C: No results for input(s): HGBA1C in the last 72 hours. CBG: No results for input(s): GLUCAP in the last 168 hours. Lipid Profile: No results for input(s): CHOL, HDL, LDLCALC, TRIG, CHOLHDL, LDLDIRECT in the last 72 hours. Thyroid Function Tests: No results for input(s): TSH, T4TOTAL, FREET4, T3FREE, THYROIDAB in the last 72 hours. Anemia Panel: No results for input(s): VITAMINB12, FOLATE, FERRITIN, TIBC, IRON, RETICCTPCT in the last 72 hours. Sepsis Labs: No results for input(s): PROCALCITON, LATICACIDVEN in the last 168 hours.  No results found for this or any previous visit (from the past 240 hour(s)).  Radiology Studies: No results found.  Scheduled Meds: . cyclobenzaprine  10 mg Oral TID  . feeding supplement (ENSURE ENLIVE)  237 mL Oral QID  . feeding supplement (PRO-STAT SUGAR FREE 64)  30 mL Oral TID  . heparin injection (subcutaneous)  5,000 Units Subcutaneous Q8H  . lidocaine  1 patch Transdermal Q24H  . nicotine  21 mg Transdermal Daily  . nystatin  5 mL Oral QID  . saccharomyces boulardii  250 mg Oral BID  . senna-docusate  1 tablet Oral QHS  . sodium chloride flush  10-40 mL Intracatheter  Q12H  . sodium chloride flush  3 mL Intravenous Q12H  . vitamin C  500 mg Oral Daily   Continuous Infusions: . sodium chloride 10 mL/hr at 01/02/18 0458  .  ceFAZolin (ANCEF) IV 2 g (01/09/18 0527)    LOS: 17 days   Time spent: 22 minutes  Standley Dakins, MD [facility] Pager [phone]  If 7PM-7AM, please contact night-coverage [email or web address] Password [email or web address] 01/09/2018, 9:01 AM

## 2018-01-10 MED ORDER — ADULT MULTIVITAMIN W/MINERALS CH
1.0000 | ORAL_TABLET | Freq: Every day | ORAL | Status: DC
  Administered 2018-01-10 – 2018-01-21 (×12): 1 via ORAL
  Filled 2018-01-10 (×12): qty 1

## 2018-01-10 MED ORDER — ENOXAPARIN SODIUM 40 MG/0.4ML ~~LOC~~ SOLN
40.0000 mg | SUBCUTANEOUS | Status: DC
  Administered 2018-01-10 – 2018-01-20 (×11): 40 mg via SUBCUTANEOUS
  Filled 2018-01-10 (×11): qty 0.4

## 2018-01-10 NOTE — Progress Notes (Signed)
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  Tricuspid valve vegetation noted on echo on 12/24/2017.  Unfortunately the patient is not a candidate for home antibiotics due to his ongoing drug use. - Continue IV cefazolin started 12/25/2017 to continue thru 02/17/18.  - Blood cultures 12/23/2017 growing out MSSA - Blood cultures from 12/25/2017 no growth to date,  PICC line placed on 11/11 - ID consult in chart - Cardiology consulted, no plan for TEE, since vegetation seen on TTE -  Will need infectious disease follow-up after discharge prior to discontinuing antibiotics.  Septic pulmonary emboli: Noted on CT of the chest.  Patient continues to have small volume hemoptysis.  Iron Deficiency Anemia: Stable at this time.  Likely multifactorial including bone marrow suppression from sepsis, anemia of chronic disease and iron deficiency anemia based on lab work -Supplement with [employer] given.  Following.  Transfuse for Hg <7.   Abdominal pain/diarrhea: Resolved.    Left shoulder myositis and septic bursitis: Noted on MRI on 12/25/2017. -Orthopedic surgery does not recommend any aspiration as there is too little fluid  L3/4 osteomyelitis, paraspinous phlegmon, right psoas muscle abscess: All likely  secondary to endocarditis.  The abscess is not significant sufficiently large for drainage at this time. -Antibiotics per above -Continue pain control  Opioid induced Constipation - Scheduled laxatives ordered.  Pt reporting regular bowel movements.   IV drug use:  -Counseling provided -Patient does not want any medication for withdrawal, however he remains on IV morphine for severe back pain a/w discitis.  Plan to taper pain medications as able.   Chronic hepatitis C: -Outpatient treatment, plan to have patient follow up with Infectious Diseases Clinic after discharge.   DVT prophylaxis: Heparin Code Status: Full Family Communication: Mother at bedside Disposition Plan: Continue IV Ancef through 12/31 for full 8-week course of treatment as recommended initially by ID.  PICC line has been placed and waiting for Medicaid approval and facility placement.  Per CM team, pt may have to stay in hospital for full treatment course.   Consultants:   ID  Cardiology  Orthopedic surgery  Procedures:  Echo 12/24/2017:- Left ventricle: The cavity size was normal. Wall thickness was normal. Systolic function was normal. The estimated ejection fraction was in the range of 55% to 60%. Wall motion was normal; there were no regional wall motion abnormalities. - Mitral valve: There was trivial regurgitation. - Right atrium: Central venous pressure (est): 3 mm Hg. - Atrial septum: No defect or patent foramen ovale was identified. - Tricuspid valve: Large, homogeneous echodensity associated with the atrial side of the septal tricuspid leaflet consistent with vegetation in the setting of documented bacteremia. There was moderate regurgitation. - Pulmonary arteries: PA peak pressure: 28 mm Hg (S). - Pericardium, extracardiac: A moderate pericardial effusion was identified posterior to the  heart.  Antimicrobials:   IV vancomycin started 12/23/2017 to 12/25/2017  IV aztreonam  12/23/2017 to 12/24/2017  IV cefazolin started 12/25/2017 to ongoing  Subjective: Pt had an exacerbation of his low back pain yesterday when taking a shower but pain has settled back down to baseline and he is feeling better this morning.   Objective: Vitals:   01/08/18 2150 01/09/18 0553 01/09/18 2132 01/10/18 0532  BP: 109/64 97/63 107/63 107/74  Pulse: 90 85 89 81  Resp: 16 18 18 18   Temp: 98.6 F (37 C) 98 F (36.7 C) 98.6 F (37 C) 98 F (36.7 C)  TempSrc: Oral Oral Oral Oral  SpO2: 97% 99% 98% 98%  Weight:      Height:        Intake/Output Summary (Last 24 hours) at 01/10/2018 0809 Last data filed at 01/10/2018 [id number] Gross per 24 hour  Intake 480 ml  Output 1000 ml  Net -520 ml   Filed Weights   12/23/17 0931 12/23/17 1627 01/01/18 1700  Weight: 81.6 kg 77.4 kg 78.7 kg    Examination:  General exam: Alert, awake, oriented x 3 Respiratory system: Clear to auscultation. Respiratory effort normal. Cardiovascular system: normal s1,s2 sounds. No murmurs, rubs, gallops. Gastrointestinal system: Abdomen is nondistended, soft and nontender. No organomegaly or masses felt. Normal bowel sounds heard. Central nervous system: Alert and oriented. No focal neurological deficits. Extremities: No C/C/E, +pedal pulses Skin: No rashes, lesions or ulcers Psychiatry: Judgement and insight appear normal. Mood & affect appropriate.   Data Reviewed: I have personally reviewed following labs and imaging studies  CBC: Recent Labs  Lab 01/04/18 1002 01/07/18 0538  WBC 9.1 10.2  HGB 7.4* 8.5*  HCT 24.0* 27.3*  MCV 89.2 90.1  PLT 433* 515*   Basic Metabolic Panel: Recent Labs  Lab 01/05/18 0522 01/07/18 0538 01/09/18 0529  CREATININE 0.63 0.54* 0.58*   GFR: Estimated Creatinine Clearance: 139.9 mL/min (A) (by C-G formula based on SCr of 0.58 mg/dL (L)). Liver Function Tests: No results for input(s): AST, ALT, ALKPHOS, BILITOT, PROT, ALBUMIN in the last 168 hours. No  results for input(s): LIPASE, AMYLASE in the last 168 hours. No results for input(s): AMMONIA in the last 168 hours. Coagulation Profile: No results for input(s): INR, PROTIME in the last 168 hours. Cardiac Enzymes: No results for input(s): CKTOTAL, CKMB, CKMBINDEX, TROPONINI in the last 168 hours. BNP (last 3 results) No results for input(s): PROBNP in the last 8760 hours. HbA1C: No results for input(s): HGBA1C in the last 72 hours. CBG: No results for input(s): GLUCAP in the last 168 hours. Lipid Profile: No results for input(s): CHOL, HDL, LDLCALC, TRIG, CHOLHDL, LDLDIRECT in the last 72 hours. Thyroid Function Tests: No results for input(s): TSH, T4TOTAL, FREET4, T3FREE, THYROIDAB in the last 72 hours. Anemia Panel: No results for input(s): VITAMINB12, FOLATE, FERRITIN, TIBC, IRON, RETICCTPCT in the last 72 hours. Sepsis Labs: No results for input(s): PROCALCITON, LATICACIDVEN in the last 168 hours.  No results found for this or any previous visit (from the past 240 hour(s)).  Radiology Studies: No results found.  Scheduled Meds: . cyclobenzaprine  7.5 mg Oral TID  . enoxaparin (LOVENOX) injection  40 mg Subcutaneous Q24H  . feeding supplement (ENSURE ENLIVE)  237 mL Oral QID  . feeding supplement (PRO-STAT SUGAR FREE 64)  30 mL Oral TID  . lidocaine  1 patch Transdermal Q24H  . multivitamin with minerals  1 tablet Oral Daily  . nicotine  21 mg Transdermal  Daily  . saccharomyces boulardii  250 mg Oral BID  . senna-docusate  1 tablet Oral BID  . sodium chloride flush  10-40 mL Intracatheter Q12H  . sodium chloride flush  3 mL Intravenous Q12H  . vitamin C  500 mg Oral Daily   Continuous Infusions: . sodium chloride 500 mL (01/10/18 0604)  .  ceFAZolin (ANCEF) IV 2 g (01/10/18 0605)    LOS: 18 days   Time spent: 16 minutes  Standley Dakins, MD [facility] Pager [phone]  If 7PM-7AM, please contact night-coverage [email or web address] Password  [email or web address] 01/10/2018, 8:09 AM

## 2018-01-11 DIAGNOSIS — B958 Unspecified staphylococcus as the cause of diseases classified elsewhere: Secondary | ICD-10-CM

## 2018-01-11 DIAGNOSIS — I330 Acute and subacute infective endocarditis: Secondary | ICD-10-CM

## 2018-01-11 LAB — CBC WITH DIFFERENTIAL/PLATELET
ABS IMMATURE GRANULOCYTES: 0.06 10*3/uL (ref 0.00–0.07)
BASOS PCT: 1 %
Basophils Absolute: 0.1 10*3/uL (ref 0.0–0.1)
Eosinophils Absolute: 0.1 10*3/uL (ref 0.0–0.5)
Eosinophils Relative: 1 %
HEMATOCRIT: 29.3 % — AB (ref 39.0–52.0)
Hemoglobin: 9.1 g/dL — ABNORMAL LOW (ref 13.0–17.0)
IMMATURE GRANULOCYTES: 1 %
LYMPHS ABS: 3 10*3/uL (ref 0.7–4.0)
Lymphocytes Relative: 29 %
MCH: 28.3 pg (ref 26.0–34.0)
MCHC: 31.1 g/dL (ref 30.0–36.0)
MCV: 91.3 fL (ref 80.0–100.0)
MONOS PCT: 9 %
Monocytes Absolute: 0.9 10*3/uL (ref 0.1–1.0)
NEUTROS ABS: 6 10*3/uL (ref 1.7–7.7)
Neutrophils Relative %: 59 %
PLATELETS: 479 10*3/uL — AB (ref 150–400)
RBC: 3.21 MIL/uL — ABNORMAL LOW (ref 4.22–5.81)
RDW: 14.9 % (ref 11.5–15.5)
WBC: 10.1 10*3/uL (ref 4.0–10.5)
nRBC: 0 % (ref 0.0–0.2)

## 2018-01-11 LAB — BASIC METABOLIC PANEL
ANION GAP: 7 (ref 5–15)
BUN: 20 mg/dL (ref 6–20)
CHLORIDE: 102 mmol/L (ref 98–111)
CO2: 27 mmol/L (ref 22–32)
Calcium: 8.9 mg/dL (ref 8.9–10.3)
Creatinine, Ser: 0.65 mg/dL (ref 0.61–1.24)
GFR calc Af Amer: >60 mL/min (ref >60)
GFR calc non Af Amer: >60 mL/min (ref >60)
GLUCOSE: 106 mg/dL — AB (ref 70–99)
POTASSIUM: 4 mmol/L (ref 3.5–5.1)
Sodium: 136 mmol/L (ref 135–145)

## 2018-01-11 MED ORDER — ZOLPIDEM TARTRATE 5 MG PO TABS
10.0000 mg | ORAL_TABLET | Freq: Every evening | ORAL | Status: DC | PRN
  Administered 2018-01-11 – 2018-01-19 (×7): 10 mg via ORAL
  Filled 2018-01-11 (×8): qty 2

## 2018-01-11 NOTE — Progress Notes (Signed)
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  Tricuspid valve vegetation noted on echo on 12/24/2017.  Unfortunately the patient is not a candidate for home antibiotics due to his ongoing drug use. - Continue IV cefazolin started 12/25/2017 to continue thru 02/17/18.  - Blood cultures 12/23/2017 growing out MSSA - Blood cultures from 12/25/2017 no growth to date,  PICC line placed on 11/11 - ID consult in chart - Cardiology consulted, no plan for TEE, since vegetation seen on TTE -  Will need infectious disease follow-up after discharge prior to discontinuing antibiotics.  Septic pulmonary emboli: Noted on CT of the chest.  Small volume hemoptysis has resolved.  Iron Deficiency Anemia: Stable at this time.  Likely multifactorial including bone marrow suppression from sepsis, anemia of chronic disease and iron deficiency anemia based on lab work -Supplement with [employer] given.  Following.  Transfuse for Hg <7.   His Hg is improving.   Abdominal pain/diarrhea: Resolved.    Left shoulder myositis and septic bursitis: Noted on MRI on 12/25/2017. -Orthopedic surgery does not recommend any aspiration as there is too little fluid  L3/4 osteomyelitis, paraspinous phlegmon, right psoas muscle abscess:  All likely secondary to endocarditis.  The abscess is not significant sufficiently large for drainage at this time. -Antibiotics per above -Continue pain control  Opioid induced Constipation - Scheduled laxatives ordered.  Pt reporting regular bowel movements.   IV drug use:  -Counseling provided -Patient does not want any medication for withdrawal, however he remains on IV morphine for severe back pain a/w discitis.  Plan to taper pain medications as able.   Chronic hepatitis C: -Outpatient treatment, plan to have patient follow up with Infectious Diseases Clinic after discharge.   DVT prophylaxis: Heparin Code Status: Full Family Communication: Mother at bedside Disposition Plan: Continue IV Ancef through 12/31 for full 8-week course of treatment as recommended initially by ID.  PICC line has been placed and waiting for Medicaid approval and facility placement.  Per CM team, pt may have to stay in hospital for full treatment course.   Consultants:   ID  Cardiology  Orthopedic surgery  Procedures:  Echo 12/24/2017:- Left ventricle: The cavity size was normal. Wall thickness was normal. Systolic function was normal. The estimated ejection fraction was in the range of 55% to 60%. Wall motion was normal; there were no regional wall motion abnormalities. - Mitral valve: There was trivial regurgitation. - Right atrium: Central venous pressure (est): 3 mm Hg. - Atrial septum: No defect or patent foramen ovale was identified. - Tricuspid valve: Large, homogeneous echodensity associated with the atrial side of the septal tricuspid leaflet consistent with vegetation in the setting of documented bacteremia. There was moderate regurgitation. - Pulmonary arteries: PA peak pressure: 28 mm Hg (S). - Pericardium, extracardiac: A moderate pericardial effusion was  identified posterior to the heart.  Antimicrobials:   IV vancomycin started 12/23/2017 to 12/25/2017  IV  aztreonam 12/23/2017 to 12/24/2017  IV cefazolin started 12/25/2017 to ongoing  Subjective: Pt not sleeping well but he was able to walk the halls earlier this morning and he did very well.    Objective: Vitals:   01/09/18 2132 01/10/18 0532 01/10/18 1404 01/11/18 0636  BP: 107/63 107/74 117/68 101/64  Pulse: 89 81 87 (!) 101  Resp: 18 18 16 18   Temp: 98.6 F (37 C) 98 F (36.7 C) 98.9 F (37.2 C) 98.8 F (37.1 C)  TempSrc: Oral Oral  Oral  SpO2: 98% 98% 99% 98%  Weight:      Height:        Intake/Output Summary (Last 24 hours) at 01/11/2018 0940 Last data filed at 01/11/2018 0600 Gross per 24 hour  Intake 1440 ml  Output 200 ml  Net 1240 ml   Filed Weights   12/23/17 0931 12/23/17 1627 01/01/18 1700  Weight: 81.6 kg 77.4 kg 78.7 kg    Examination:  General exam: Alert, awake, oriented x 3 Respiratory system: Clear to auscultation. Respiratory effort normal. Cardiovascular system: normal s1,s2 sounds. No murmurs, rubs, gallops. Gastrointestinal system: Abdomen is nondistended, soft and nontender. No organomegaly or masses felt. Normal bowel sounds heard. Central nervous system: Alert and oriented. No focal neurological deficits. Extremities: No C/C/E, +pedal pulses Skin: No rashes, lesions or ulcers Psychiatry: Judgement and insight appear normal. Mood & affect appropriate.   Data Reviewed: I have personally reviewed following labs and imaging studies  CBC: Recent Labs  Lab 01/04/18 1002 01/07/18 0538 01/11/18 0653  WBC 9.1 10.2 10.1  NEUTROABS  --   --  6.0  HGB 7.4* 8.5* 9.1*  HCT 24.0* 27.3* 29.3*  MCV 89.2 90.1 91.3  PLT 433* 515* 479*   Basic Metabolic Panel: Recent Labs  Lab 01/05/18 0522 01/07/18 0538 01/09/18 0529 01/11/18 0653  NA  --   --   --  136  K  --   --   --  4.0  CL  --   --   --  102  CO2  --   --   --  27  GLUCOSE  --   --   --  106*  BUN  --   --   --  20  CREATININE 0.63 0.54* 0.58* 0.65  CALCIUM  --   --   --  8.9    GFR: Estimated Creatinine Clearance: 139.9 mL/min (by C-G formula based on SCr of 0.65 mg/dL). Liver Function Tests: No results for input(s): AST, ALT, ALKPHOS, BILITOT, PROT, ALBUMIN in the last 168 hours. No results for input(s): LIPASE, AMYLASE in the last 168 hours. No results for input(s): AMMONIA in the last 168 hours. Coagulation Profile: No results for input(s): INR, PROTIME in the last 168 hours. Cardiac Enzymes: No results for input(s): CKTOTAL, CKMB, CKMBINDEX, TROPONINI in the last 168 hours. BNP (last 3 results) No results for input(s): PROBNP in the last 8760 hours. HbA1C: No results for input(s): HGBA1C in the last 72 hours. CBG: No results for input(s): GLUCAP in the last 168 hours. Lipid Profile: No results for input(s): CHOL, HDL, LDLCALC, TRIG, CHOLHDL, LDLDIRECT in the last 72 hours. Thyroid Function Tests: No results for input(s): TSH, T4TOTAL, FREET4, T3FREE, THYROIDAB in the last 72 hours. Anemia Panel: No results for input(s): VITAMINB12, FOLATE, FERRITIN, TIBC, IRON, RETICCTPCT in the last 72 hours. Sepsis Labs: No results for  input(s): PROCALCITON, LATICACIDVEN in the last 168 hours.  No results found for this or any previous visit (from the past 240 hour(s)).  Radiology Studies: No results found.  Scheduled Meds: . cyclobenzaprine  7.5 mg Oral TID  . enoxaparin (LOVENOX) injection  40 mg Subcutaneous Q24H  . feeding supplement (ENSURE ENLIVE)  237 mL Oral QID  . feeding supplement (PRO-STAT SUGAR FREE 64)  30 mL Oral TID  . lidocaine  1 patch Transdermal Q24H  . multivitamin with minerals  1 tablet Oral Daily  . nicotine  21 mg Transdermal Daily  . saccharomyces boulardii  250 mg Oral BID  . senna-docusate  1 tablet Oral BID  . sodium chloride flush  10-40 mL Intracatheter Q12H  . sodium chloride flush  3 mL Intravenous Q12H  . vitamin C  500 mg Oral Daily   Continuous Infusions: . sodium chloride 500 mL (01/11/18 0535)  .  ceFAZolin (ANCEF)  IV 2 g (01/11/18 0535)    LOS: 19 days   Time spent: 15 minutes  Standley Dakinslanford Angele Wiemann, MD [facility] Pager [phone]  If 7PM-7AM, please contact night-coverage [email or web address] Password [email or web address] 01/11/2018, 9:40 AM

## 2018-01-12 LAB — CREATININE, SERUM
Creatinine, Ser: 0.63 mg/dL (ref 0.61–1.24)
GFR calc non Af Amer: >60 mL/min (ref >60)

## 2018-01-12 MED ORDER — BUSPIRONE HCL 5 MG PO TABS
7.5000 mg | ORAL_TABLET | Freq: Two times a day (BID) | ORAL | Status: DC
  Administered 2018-01-12 – 2018-01-14 (×6): 7.5 mg via ORAL
  Filled 2018-01-12 (×6): qty 2

## 2018-01-12 NOTE — Progress Notes (Signed)
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  Tricuspid valve vegetation noted on echo on 12/24/2017.  Unfortunately the patient is not a candidate for home antibiotics due to his ongoing drug use. - Continue IV cefazolin started 12/25/2017 to continue thru 02/17/18.  - Blood cultures 12/23/2017 growing out MSSA - Blood cultures from 12/25/2017 no growth to date,  PICC line placed on 11/11 - ID consult in chart - Cardiology consulted, no plan for TEE, since vegetation seen on TTE -  Will need infectious disease follow-up after discharge prior to discontinuing antibiotics.  Septic pulmonary emboli: Noted on CT of the chest.  Small volume hemoptysis has resolved.  Iron Deficiency Anemia: Stable at this time.  Likely multifactorial including bone marrow suppression from sepsis, anemia of chronic disease and iron deficiency anemia based on lab work -Supplement with [employer] given.  Following.  Transfuse for Hg <7.   His Hg is improving.   Abdominal pain/diarrhea: Resolved.    Left shoulder myositis and septic bursitis: Noted on MRI on 12/25/2017. -Orthopedic surgery does not recommend any aspiration as there is too little fluid  L3/4 osteomyelitis, paraspinous phlegmon, right psoas muscle abscess:  All likely secondary to endocarditis.  The abscess is not significant sufficiently large for drainage at this time. -Antibiotics per above -Continue pain control  Opioid induced Constipation - Scheduled laxatives ordered.  Pt reporting regular bowel movements.   IV drug use:  -Pt interested in following up with behavioral health -Patient does not want any medication for withdrawal, however he remains on IV morphine for severe back pain a/w discitis.  Plan to taper pain medications as able.   Chronic hepatitis C: -Outpatient treatment, plan to have patient follow up with Infectious Diseases Clinic after discharge.   Generalized Anxiety Disorder -Trial of BuSpar (buspirone) 7.5 mg p.o. twice daily and titrate as needed for symptom relief.  Trying to avoid benzos in the setting of ongoing substance abuse and opioid dependence.   DVT prophylaxis: Heparin Code Status: Full Family Communication: Mother at bedside Disposition Plan: Continue IV Ancef through 12/31 for full 8-week course of treatment as recommended initially by ID.  PICC line has been placed and waiting for Medicaid approval and facility placement.  Per CM team, pt may have to stay in hospital for full treatment course.  Update 11/25: Mother says that his [place] has been approved.   Consultants:   ID  Cardiology  Orthopedic surgery  Procedures:  Echo 12/24/2017:- Left ventricle: The cavity size was normal. Wall thickness was normal. Systolic function was normal. The estimated ejection fraction was in the range of 55% to 60%. Wall motion was normal; there were no regional wall motion abnormalities. - Mitral valve: There was trivial regurgitation. - Right atrium: Central venous pressure (est): 3 mm Hg. - Atrial septum: No  defect or patent foramen ovale was identified. - Tricuspid valve: Large, homogeneous echodensity associated with the atrial side of the septal tricuspid leaflet consistent  with vegetation in the setting of documented bacteremia. There was moderate regurgitation. - Pulmonary arteries: PA peak pressure: 28 mm Hg (S). - Pericardium, extracardiac: A moderate pericardial effusion was identified posterior to the heart.  Antimicrobials:   IV vancomycin started 12/23/2017 to 12/25/2017  IV aztreonam 12/23/2017 to 12/24/2017  IV cefazolin started 12/25/2017 to ongoing  Subjective: Pt says he slept much better with higher dose of zolpidem and he wants to ambulate more today.  His pain is a little better.  He is having a lot of anxiety.     Objective: Vitals:   01/11/18 1414 01/11/18 2139 01/12/18 0554 01/12/18 0734  BP: 104/63 118/78 105/75 121/77  Pulse: 96 100 85 95  Resp: 16 15 16    Temp: 98.2 F (36.8 C) 98.1 F (36.7 C) 97.8 F (36.6 C)   TempSrc: Oral Oral Oral   SpO2: 99% 97% 99%   Weight:      Height:        Intake/Output Summary (Last 24 hours) at 01/12/2018 0820 Last data filed at 01/12/2018 0548 Gross per 24 hour  Intake 1200 ml  Output 2275 ml  Net -1075 ml   Filed Weights   12/23/17 0931 12/23/17 1627 01/01/18 1700  Weight: 81.6 kg 77.4 kg 78.7 kg    Examination:  General exam: Alert, awake, oriented x 3 Respiratory system: Clear to auscultation. Respiratory effort normal. Cardiovascular system: normal s1,s2 sounds. No murmurs, rubs, gallops. Gastrointestinal system: Abdomen is nondistended, soft and nontender. No organomegaly or masses felt. Normal bowel sounds heard. Central nervous system: Alert and oriented. No focal neurological deficits. Extremities: No C/C/E, +pedal pulses Skin: No rashes, lesions or ulcers Psychiatry: Judgement and insight appear normal. Mood & affect appropriate.   Data Reviewed: I have personally reviewed following labs and imaging studies  CBC: Recent Labs  Lab 01/07/18 0538 01/11/18 0653  WBC 10.2 10.1  NEUTROABS  --  6.0  HGB 8.5* 9.1*  HCT 27.3* 29.3*  MCV 90.1 91.3  PLT 515* 479*    Basic Metabolic Panel: Recent Labs  Lab 01/07/18 0538 01/09/18 0529 01/11/18 0653 01/12/18 0535  NA  --   --  136  --   K  --   --  4.0  --   CL  --   --  102  --   CO2  --   --  27  --   GLUCOSE  --   --  106*  --   BUN  --   --  20  --   CREATININE 0.54* 0.58* 0.65 0.63  CALCIUM  --   --  8.9  --    GFR: Estimated Creatinine Clearance: 139.9 mL/min (by C-G formula based on SCr of 0.63 mg/dL). Liver Function Tests: No results for input(s): AST, ALT, ALKPHOS, BILITOT, PROT, ALBUMIN in the last 168 hours. No results for input(s): LIPASE, AMYLASE in the last 168 hours. No results for input(s): AMMONIA in the last 168 hours. Coagulation Profile: No results for input(s): INR, PROTIME in the last 168 hours. Cardiac Enzymes: No results for input(s): CKTOTAL, CKMB, CKMBINDEX, TROPONINI in the last 168 hours. BNP (last 3 results) No results for input(s): PROBNP in the last 8760 hours. HbA1C: No results for input(s): HGBA1C in the last 72 hours. CBG: No results for input(s): GLUCAP in the last 168 hours. Lipid Profile:  No results for input(s): CHOL, HDL, LDLCALC, TRIG, CHOLHDL, LDLDIRECT in the last 72 hours. Thyroid Function Tests: No results for input(s): TSH, T4TOTAL, FREET4, T3FREE, THYROIDAB in the last 72 hours. Anemia Panel: No results for input(s): VITAMINB12, FOLATE, FERRITIN, TIBC, IRON, RETICCTPCT in the last 72 hours. Sepsis Labs: No results for input(s): PROCALCITON, LATICACIDVEN in the last 168 hours.  No results found for this or any previous visit (from the past 240 hour(s)).  Radiology Studies: No results found.  Scheduled Meds: . cyclobenzaprine  7.5 mg Oral TID  . enoxaparin (LOVENOX) injection  40 mg Subcutaneous Q24H  . feeding supplement (ENSURE ENLIVE)  237 mL Oral QID  . feeding supplement (PRO-STAT SUGAR FREE 64)  30 mL Oral TID  . lidocaine  1 patch Transdermal Q24H  . multivitamin with minerals  1 tablet Oral Daily  . nicotine  21 mg  Transdermal Daily  . saccharomyces boulardii  250 mg Oral BID  . senna-docusate  1 tablet Oral BID  . sodium chloride flush  10-40 mL Intracatheter Q12H  . sodium chloride flush  3 mL Intravenous Q12H  . vitamin C  500 mg Oral Daily   Continuous Infusions: . sodium chloride 500 mL (01/12/18 0547)  .  ceFAZolin (ANCEF) IV 2 g (01/12/18 0548)    LOS: 20 days   Time spent: 20 minutes  Standley Dakinslanford Johnson, MD [facility] Pager [phone]  If 7PM-7AM, please contact night-coverage [email or web address] Password [email or web address] 01/12/2018, 8:20 AM

## 2018-01-13 MED ORDER — KETOROLAC TROMETHAMINE 30 MG/ML IJ SOLN
30.0000 mg | Freq: Once | INTRAMUSCULAR | Status: AC
  Administered 2018-01-13: 30 mg via INTRAVENOUS
  Filled 2018-01-13: qty 1

## 2018-01-13 MED ORDER — SENNOSIDES-DOCUSATE SODIUM 8.6-50 MG PO TABS: 1.0000 | ORAL_TABLET | Freq: Every evening | ORAL | Status: DC | PRN

## 2018-01-13 NOTE — Progress Notes (Signed)
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  Tricuspid valve vegetation noted on echo on 12/24/2017.  Unfortunately the patient is not a candidate for home antibiotics due to his ongoing drug use. - Continue IV cefazolin started 12/25/2017 to continue thru 02/17/18.  - Blood cultures 12/23/2017 growing out MSSA - Blood cultures from 12/25/2017 no growth to date,  PICC line placed on 11/11 - ID consult in chart - Cardiology consulted, no plan for TEE, since vegetation seen on TTE -  Will need infectious disease follow-up after discharge prior to discontinuing antibiotics.  Septic pulmonary emboli: Noted on CT of the chest.  Small volume hemoptysis has resolved.  Iron Deficiency Anemia: Stable at this time.  Likely multifactorial including bone marrow suppression from sepsis, anemia of chronic disease and iron deficiency anemia based on lab work -Supplement with [employer] given.  Following.  Transfuse for Hg <7.   His Hg is improving.   Abdominal pain/diarrhea: Resolved.    Left shoulder myositis and septic bursitis: Noted on MRI on 12/25/2017. -Orthopedic surgery does not recommend any aspiration as there is too little fluid  L3/4 osteomyelitis, paraspinous phlegmon, right psoas muscle abscess:  All likely secondary to endocarditis.  The abscess is not significant sufficiently large for drainage at this time. -Antibiotics per above -Continue pain control  Opioid induced Constipation - Scheduled laxatives ordered.  Pt reporting regular bowel movements.   IV drug use:  -Pt interested in following up with behavioral health -Patient does not want any medication for withdrawal, however he remains on IV morphine for severe back pain a/w discitis.  Plan to taper pain medications as able.   Chronic hepatitis C: -Outpatient treatment, plan to have patient follow up with Infectious Diseases Clinic after discharge.   Generalized Anxiety Disorder -Trial of BuSpar (buspirone) 7.5 mg p.o. twice daily and titrate as needed for symptom relief.  Trying to avoid benzos in the setting of ongoing substance abuse and opioid dependence.   DVT prophylaxis: Heparin Code Status: Full Family Communication: Mother at bedside Disposition Plan: Continue IV Ancef through 12/31 for full 8-week course of treatment as recommended initially by ID.  PICC line has been placed and waiting for Medicaid approval and facility placement.  Per CM team, pt may have to stay in hospital for full treatment course.  Update 11/25: Mother says that his [place] has been approved .   Consultants:   ID  Cardiology  Orthopedic surgery  Procedures:  Echo 12/24/2017:- Left ventricle: The cavity size was normal. Wall thickness was normal. Systolic function was normal. The estimated ejection fraction was in the range of 55% to 60%. Wall motion was normal; there were no regional wall motion abnormalities. - Mitral valve: There was trivial regurgitation. - Right atrium: Central venous pressure (est): 3 mm Hg. - Atrial septum:  No defect or patent foramen ovale was identified. - Tricuspid valve: Large, homogeneous echodensity associated with the atrial side of the septal tricuspid leaflet consistent  with vegetation in the setting of documented bacteremia. There was moderate regurgitation. - Pulmonary arteries: PA peak pressure: 28 mm Hg (S). - Pericardium, extracardiac: A moderate pericardial effusion was identified posterior to the heart.  Antimicrobials:   IV vancomycin started 12/23/2017 to 12/25/2017  IV aztreonam 12/23/2017 to 12/24/2017  IV cefazolin started 12/25/2017 to ongoing  Subjective: Pt says that he had a right-sided pain in the hip and lower back yesterday after he had walked a couple of times in the halls.  He says he is feeling much better this morning and the pain is nearly completely resolved.  He also says that his bowels have been loose.  He is eating very well.  Objective: Vitals:   01/12/18 1446 01/12/18 2228 01/13/18 0602 01/13/18 0609  BP: 122/77 (!) 100/58 91/63 (!) 101/56  Pulse: 99 96 87 85  Resp: 16 16 15    Temp: 98.1 F (36.7 C) 98.3 F (36.8 C) 98.7 F (37.1 C)   TempSrc: Oral Oral Oral   SpO2: 98% 99% 100%   Weight:      Height:        Intake/Output Summary (Last 24 hours) at 01/13/2018 1028 Last data filed at 01/13/2018 0609 Gross per 24 hour  Intake 480 ml  Output 1600 ml  Net -1120 ml   Filed Weights   12/23/17 0931 12/23/17 1627 01/01/18 1700  Weight: 81.6 kg 77.4 kg 78.7 kg    Examination:  General exam: Alert, awake, oriented x 3 Respiratory system: Clear to auscultation. Respiratory effort normal. Cardiovascular system: normal s1,s2 sounds. No murmurs, rubs, gallops. Gastrointestinal system: Abdomen is nondistended, soft and nontender. No organomegaly or masses felt. Normal bowel sounds heard. Central nervous system: Alert and oriented. No focal neurological deficits. Extremities: No C/C/E, +pedal pulses Skin: No rashes, lesions or ulcers Psychiatry: Judgement and insight appear normal. Mood & affect appropriate.   Data Reviewed: I have personally reviewed following labs and imaging studies  CBC: Recent Labs   Lab 01/07/18 0538 01/11/18 0653  WBC 10.2 10.1  NEUTROABS  --  6.0  HGB 8.5* 9.1*  HCT 27.3* 29.3*  MCV 90.1 91.3  PLT 515* 479*   Basic Metabolic Panel: Recent Labs  Lab 01/07/18 0538 01/09/18 0529 01/11/18 0653 01/12/18 0535  NA  --   --  136  --   K  --   --  4.0  --   CL  --   --  102  --   CO2  --   --  27  --   GLUCOSE  --   --  106*  --   BUN  --   --  20  --   CREATININE 0.54* 0.58* 0.65 0.63  CALCIUM  --   --  8.9  --    GFR: Estimated Creatinine Clearance: 139.9 mL/min (by C-G formula based on SCr of 0.63 mg/dL). Liver Function Tests: No results for input(s): AST, ALT, ALKPHOS, BILITOT, PROT, ALBUMIN in the last 168 hours. No results for input(s): LIPASE, AMYLASE in the last 168 hours. No results for input(s): AMMONIA in the last 168 hours. Coagulation Profile: No results for input(s): INR, PROTIME in the last 168 hours. Cardiac Enzymes: No results for input(s): CKTOTAL, CKMB, CKMBINDEX, TROPONINI in the last 168 hours. BNP (last 3 results) No results for input(s): PROBNP in the last  8760 hours. HbA1C: No results for input(s): HGBA1C in the last 72 hours. CBG: No results for input(s): GLUCAP in the last 168 hours. Lipid Profile: No results for input(s): CHOL, HDL, LDLCALC, TRIG, CHOLHDL, LDLDIRECT in the last 72 hours. Thyroid Function Tests: No results for input(s): TSH, T4TOTAL, FREET4, T3FREE, THYROIDAB in the last 72 hours. Anemia Panel: No results for input(s): VITAMINB12, FOLATE, FERRITIN, TIBC, IRON, RETICCTPCT in the last 72 hours. Sepsis Labs: No results for input(s): PROCALCITON, LATICACIDVEN in the last 168 hours.  No results found for this or any previous visit (from the past 240 hour(s)).  Radiology Studies: No results found.  Scheduled Meds: . busPIRone  7.5 mg Oral BID  . cyclobenzaprine  7.5 mg Oral TID  . enoxaparin (LOVENOX) injection  40 mg Subcutaneous Q24H  . feeding supplement (ENSURE ENLIVE)  237 mL Oral QID  . feeding  supplement (PRO-STAT SUGAR FREE 64)  30 mL Oral TID  . lidocaine  1 patch Transdermal Q24H  . multivitamin with minerals  1 tablet Oral Daily  . nicotine  21 mg Transdermal Daily  . saccharomyces boulardii  250 mg Oral BID  . sodium chloride flush  10-40 mL Intracatheter Q12H  . sodium chloride flush  3 mL Intravenous Q12H  . vitamin C  500 mg Oral Daily   Continuous Infusions: . sodium chloride 500 mL (01/12/18 0547)  .  ceFAZolin (ANCEF) IV 2 g (01/13/18 1610)    LOS: 21 days   Time spent: 22 minutes  Standley Dakins, MD [facility] Pager [phone]  If 7PM-7AM, please contact night-coverage [email or web address] Password [facility] 01/13/2018, 10:28 AM

## 2018-01-13 NOTE — Progress Notes (Signed)
Patient states that he got up to use the bathroom and something "popped" in his lower right back. When patient was asked to describe his pain he states "it just hurts and I can't move", MD aware. Orders placed and followed.

## 2018-01-13 NOTE — Care Management Note (Signed)
Case Management Note  Patient Details  Name: Howard Diaz MRN: [id number] Date of Birth: 11/07/1985   If discussed at Long Length of Stay Meetings, dates discussed:  01/13/2018  Additional Comments:  Japheth Diekman, Chrystine OilerSharley Diane, RN 01/13/2018, 12:03 PM

## 2018-01-14 LAB — CREATININE, SERUM
Creatinine, Ser: 0.58 mg/dL — ABNORMAL LOW (ref 0.61–1.24)
GFR calc non Af Amer: >60 mL/min (ref >60)

## 2018-01-14 NOTE — Progress Notes (Signed)
Nutrition Follow-up  INTERVENTION:  Regular diet   Ensure Enlive po TID, each supplement provides 350 kcal and 20 grams of protein   Obtain current weight   NUTRITION DIAGNOSIS:   Severe Malnutrition related to acute illness, poor appetite, wound healing(lumbar osteomyelitits, endocarditis) as evidenced by estimated needs, percent weight loss, per patient/family report, energy intake < or equal to 50% for > or equal to 5 days, wt loss 5% in less than 1 month; stable with significant improvement in appetite and intake.  GOAL:  Patient will meet greater than or equal to 90% of their needs  MONITOR:   PO intake, Supplement acceptance, Labs, Weight trends, Skin    ASSESSMENT:  Patient is a 32 yo male with hx of ETOH abuse, Hepatitis C and IV drug and tobacco abuse. He presented to ED 11/5 and found to be septic with acute lumbar osteomyelitis (confirmed by MRI). CRP elevated-29.6,  Phosphorus 2.4- low.  At bedside mother is present. Patients appetite has returned to baseline. His mother says she is bringing in outside food daily of which he is consuming 100%. He also has multiple outside snacks in room (chips, etc). In addition he continues to consume the Ensure Enlive at least 2-3 daily.   As of 11/14-His weight is down approximately 10 lb (4.5 kg) 5% in < 1 month- acute onset of illness.  Last wt obtained 11/14. Follow up weight is pending. Expect significant increase in weight due to patient reported kcal/protein intake.   Labs:   BMP Latest Ref Rng & Units 01/14/2018 01/12/2018 01/11/2018  Glucose 70 - 99 mg/dL - - 161(W106(H)  BUN 6 - 20 mg/dL - - 20  Creatinine 9.600.61 - 1.24 mg/dL 4.54(U0.58(L) 9.810.63 1.910.65  Sodium 135 - 145 mmol/L - - 136  Potassium 3.5 - 5.1 mmol/L - - 4.0  Chloride 98 - 111 mmol/L - - 102  CO2 22 - 32 mmol/L - - 27  Calcium 8.9 - 10.3 mg/dL - - 8.9     NUTRITION - FOCUSED PHYSICAL EXAM:  Diet Order:   Diet Order            Diet regular Room service appropriate?  Yes; Fluid consistency: Thin  Diet effective now             EDUCATION NEEDS:   Education needs have been addressed   Skin:  Skin Assessment: Reviewed RN Assessment  Last BM:  11/21   Height:   Ht Readings from Last 1 Encounters:  12/23/17 5\' 11"  (1.803 m)    Weight:   Wt Readings from Last 1 Encounters:  01/01/18 78.7 kg    Ideal Body Weight:  78 kg  BMI:  Body mass index is 24.18 kg/m.  Estimated Nutritional Needs:   Kcal:  [id number] (33-35 kcal/kg/bw)  Protein:  108-123 gr (1.4-1.6 gr/kg/bw)  Fluid:  2.5-2.7 liters daily   Royann ShiversLynn Dyke Weible MS,RD,CSG,LDN Office: [phone] Pager: [phone]

## 2018-01-14 NOTE — Progress Notes (Signed)
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  Tricuspid valve vegetation noted on echo on 12/24/2017.  Unfortunately the patient is not a candidate for home antibiotics due to his ongoing drug use. - Continue IV cefazolin started 12/25/2017 to continue thru 02/17/18.  - Blood cultures 12/23/2017 growing out MSSA - Blood cultures from 12/25/2017 no growth to date,  PICC line placed on 11/11 - ID consult in chart - Cardiology consulted, no plan for TEE, since vegetation seen on TTE -  Will need infectious disease follow-up after discharge prior to discontinuing antibiotics.  Septic pulmonary emboli: Noted on CT of the chest.  Small volume hemoptysis has resolved.  Iron Deficiency Anemia: Stable at this time.  Likely multifactorial including bone marrow suppression from sepsis, anemia of chronic disease and iron deficiency anemia based on lab work -Supplement with [employer] given.  Following.  Transfuse for Hg <7.   His Hg is improving.   Abdominal pain/diarrhea: Resolved.    Left shoulder myositis and septic bursitis: Noted on MRI on 12/25/2017. -Orthopedic surgery does not recommend any aspiration as there is too little fluid  L3/4 osteomyelitis, paraspinous phlegmon, right psoas muscle abscess:  All likely secondary to endocarditis.  The abscess is not significant sufficiently large for drainage at this time. -Antibiotics per above -Continue pain control  Opioid induced Constipation - Scheduled laxatives ordered.  Pt reporting regular bowel movements.   IV drug use:  -Pt interested in following up with behavioral health -Patient does not want any medication for withdrawal, however he remains on IV morphine for severe back pain a/w discitis.  Plan to taper pain medications as able.   Chronic hepatitis C: -Outpatient treatment, plan to have patient follow up with Infectious Diseases Clinic after discharge.   Generalized Anxiety Disorder -Trial of BuSpar (buspirone) 7.5 mg p.o. twice daily and titrate as needed for symptom relief.  Trying to avoid benzos in the setting of ongoing substance abuse and opioid dependence.   DVT prophylaxis: Heparin Code Status: Full Family Communication: Mother at bedside Disposition Plan: Continue IV Ancef through 12/31 for full 8-week course of treatment as recommended initially by ID.  PICC line has been placed and waiting for Medicaid approval and facility placement.  Per CM team, pt may have to stay in hospital for full treatment course.  Update 11/25: Mother says that his [place] has been approved.   Consultants:   ID  Cardiology  Orthopedic surgery  Procedures:  Echo 12/24/2017:- Left ventricle: The cavity size was normal. Wall thickness was normal. Systolic function was normal. The estimated ejectionfraction was in the range of 55% to 60%. Wall motion was normal; there were no regional wall motion abnormalities. - Mitral valve: There was trivial regurgitation. - Right atrium: Central venous pressure (est): 3 mm Hg. - Atrial septum: No  defect or patent foramen ovale was identified. - Tricuspid valve: Large, homogeneous echodensity associated withthe atrial side of the septal tricuspid leaflet consistent  with vegetation in the setting of documented bacteremia. There wasmoderate regurgitation. - Pulmonary arteries: PA peak pressure: 28 mm Hg (S). - Pericardium, extracardiac: A moderate pericardial effusion wasidentified posterior to the heart.  Antimicrobials:   IV vancomycin started 12/23/2017 to 12/25/2017  IV aztreonam 12/23/2017 to 12/24/2017  IV cefazolin started 12/25/2017 to ongoing  Subjective: Pt says he has back spasms after walking but he has been ambulating 2-3 times per day.  He is getting bored being in hospital.   Objective: Vitals:   01/13/18 0609 01/13/18 1651 01/13/18 2140 01/14/18 0634  BP: (!) 101/56 107/64 110/64 101/67  Pulse: 85 96 97 87  Resp:  18 16 18   Temp:   97.8 F (36.6 C) 98.5 F (36.9 C)  TempSrc:   Oral Oral  SpO2:  99% 99% 100%  Weight:      Height:        Intake/Output Summary (Last 24 hours) at 01/14/2018 1111 Last data filed at 01/14/2018 0800 Gross per 24 hour  Intake 730 ml  Output -  Net 730 ml   Filed Weights   12/23/17 0931 12/23/17 1627 01/01/18 1700  Weight: 81.6 kg 77.4 kg 78.7 kg    Examination:  General exam: Alert, awake, oriented x 3 Respiratory system: Clear to auscultation. Respiratory effort normal. Cardiovascular system: normal s1,s2 sounds. No murmurs, rubs, gallops. Gastrointestinal system: Abdomen is nondistended, soft and nontender. No organomegaly or masses felt. Normal bowel sounds heard. Central nervous system: Alert and oriented. No focal neurological deficits. Extremities: No C/C/E, +pedal pulses.  Musculoskeletal: spasm of lumbar paraspinal muscsles.  Skin: No rashes, lesions or ulcers.  Psychiatry: Judgement and insight appear normal. Mood & affect appropriate.   Data Reviewed: I have personally reviewed following labs and imaging studies  CBC: Recent Labs  Lab 01/11/18 0653  WBC 10.1  NEUTROABS 6.0  HGB 9.1*  HCT 29.3*  MCV 91.3  PLT 479*   Basic Metabolic Panel: Recent Labs  Lab  01/09/18 0529 01/11/18 0653 01/12/18 0535 01/14/18 0608  NA  --  136  --   --   K  --  4.0  --   --   CL  --  102  --   --   CO2  --  27  --   --   GLUCOSE  --  106*  --   --   BUN  --  20  --   --   CREATININE 0.58* 0.65 0.63 0.58*  CALCIUM  --  8.9  --   --    GFR: Estimated Creatinine Clearance: 139.9 mL/min (A) (by C-G formula based on SCr of 0.58 mg/dL (L)). Liver Function Tests: No results for input(s): AST, ALT, ALKPHOS, BILITOT, PROT, ALBUMIN in the last 168 hours. No results for input(s): LIPASE, AMYLASE in the last 168 hours. No results for input(s): AMMONIA in the last 168 hours. Coagulation Profile: No results for input(s): INR, PROTIME in the last 168 hours. Cardiac Enzymes: No results for input(s): CKTOTAL, CKMB, CKMBINDEX, TROPONINI in the last 168 hours. BNP (last 3 results) No results for input(s): PROBNP in the last 8760 hours. HbA1C: No results for input(s): HGBA1C in the last 72 hours. CBG: No results for input(s): GLUCAP in the last 168 hours. Lipid Profile: No results for input(s): CHOL, HDL, LDLCALC, TRIG, CHOLHDL, LDLDIRECT in the last 72 hours.  Thyroid Function Tests: No results for input(s): TSH, T4TOTAL, FREET4, T3FREE, THYROIDAB in the last 72 hours. Anemia Panel: No results for input(s): VITAMINB12, FOLATE, FERRITIN, TIBC, IRON, RETICCTPCT in the last 72 hours. Sepsis Labs: No results for input(s): PROCALCITON, LATICACIDVEN in the last 168 hours.  No results found for this or any previous visit (from the past 240 hour(s)).  Radiology Studies: No results found.  Scheduled Meds: . busPIRone  7.5 mg Oral BID  . cyclobenzaprine  7.5 mg Oral TID  . enoxaparin (LOVENOX) injection  40 mg Subcutaneous Q24H  . feeding supplement (ENSURE ENLIVE)  237 mL Oral QID  . feeding supplement (PRO-STAT SUGAR FREE 64)  30 mL Oral TID  . multivitamin with minerals  1 tablet Oral Daily  . saccharomyces boulardii  250 mg Oral BID  . sodium chloride flush   10-40 mL Intracatheter Q12H  . sodium chloride flush  3 mL Intravenous Q12H  . vitamin C  500 mg Oral Daily   Continuous Infusions: . sodium chloride 500 mL (01/12/18 0547)  .  ceFAZolin (ANCEF) IV 2 g (01/14/18 [id number])    LOS: 22 days   Time spent: 21 minutes  Standley Dakinslanford , MD [facility] Pager [phone]  If 7PM-7AM, please contact night-coverage [email or web address] Password [email or web address] 01/14/2018, 11:11 AM

## 2018-01-15 MED ORDER — BUSPIRONE HCL 5 MG PO TABS
10.0000 mg | ORAL_TABLET | Freq: Two times a day (BID) | ORAL | Status: DC
  Administered 2018-01-15 – 2018-01-19 (×9): 10 mg via ORAL
  Filled 2018-01-15 (×9): qty 2

## 2018-01-15 NOTE — Progress Notes (Signed)
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  Tricuspid valve vegetation noted on echo on 12/24/2017.  Unfortunately the patient is not a candidate for home antibiotics due to his ongoing drug use. - Continue IV cefazolin started 12/25/2017 to continue thru 02/17/18.  - Blood cultures 12/23/2017 growing out MSSA - Blood cultures from 12/25/2017 no growth to date,  PICC line placed on 11/11 - ID consult in chart - Cardiology consulted, no plan for TEE, since vegetation seen on TTE -  Will need infectious disease follow-up after discharge prior to discontinuing antibiotics. -  Pt is being counseled daily on the importance of completing the full course of treatment for this life threatening infection.   Septic pulmonary emboli: Noted on CT of the chest.  Hemoptysis has completely resolved.  Iron Deficiency Anemia: Stable at this time.  Likely multifactorial including bone marrow suppression from sepsis, anemia of chronic disease and iron deficiency anemia based on lab work -Supplemented with [employer].   His Hg is improving.   Abdominal pain/diarrhea: Resolved.    Left shoulder myositis and septic bursitis: Noted on MRI on 12/25/2017. -Orthopedic surgery does not recommend any aspiration as there is  too little fluid  L3/4 osteomyelitis, paraspinous phlegmon, right psoas muscle abscess: All likely secondary to endocarditis.  The abscess is not significant sufficiently large for drainage at this time. -Antibiotics per above -Continue pain control  Opioid induced Constipation - Scheduled laxatives ordered.  Pt reporting regular bowel movements.   IV drug use:  -Pt interested in following up with behavioral health -Patient does not want any medication for withdrawal, however he remains on IV morphine for severe back pain a/w discitis.  Plan to taper pain medications as able.   Chronic hepatitis C: -Outpatient treatment, plan to have patient follow up with Infectious Diseases Clinic after discharge.   Generalized Anxiety Disorder -BuSpar (buspirone); increased dose to 10 mg p.o. twice daily on 11/28 and titrate as needed for symptom relief.  Trying to avoid benzos in the setting of ongoing substance abuse and opioid dependence.   DVT prophylaxis: Heparin Code Status: Full Family Communication: Mother updated at bedside Disposition Plan: Continue IV Ancef through 12/31 for full 8-week course of treatment as recommended initially by ID.  PICC line has been placed.  Per CM team, pt was not accepted at any SNF facilities due to his drug abuse.  Update 11/25: Mother says that his [place] has been approved.  Pt will have to remain in hospital until full course of treatment has been completed.    Consultants:   ID  Cardiology  Orthopedic surgery  Procedures:  Echo 12/24/2017:- Left ventricle: The cavity size was normal. Wall thickness was normal. Systolic function was normal. The estimated ejectionfraction was in the range of 55% to 60%. Wall motion was  normal; there were no regional wall motion abnormalities. - Mitral valve: There was trivial regurgitation. - Right atrium: Central venous pressure (est): 3 mm Hg. - Atrial septum: No defect or patent foramen ovale was  identified. - Tricuspid valve: Large, homogeneous echodensity associated withthe atrial side of the septal tricuspid leaflet consistent with vegetation in the setting of documented bacteremia. There wasmoderate regurgitation. - Pulmonary arteries: PA peak pressure: 28 mm Hg (S). - Pericardium, extracardiac: A moderate pericardial effusion wasidentified posterior to the heart.  Antimicrobials:   IV vancomycin started 12/23/2017 to 12/25/2017  IV aztreonam 12/23/2017 to 12/24/2017  IV cefazolin started 12/25/2017 to ongoing  Subjective: Pt says he continues to have back spasms after walking but gets better with ice packs.  He wants to sleep mostly during the day.  He has no chest pain and no shortness of breath.  He has been having bowel movements.  He is ambulating with the walker in the hallways.     Objective: Vitals:   01/13/18 2140 01/14/18 0634 01/14/18 1535 01/15/18 0700  BP: 110/64 101/67 97/61 103/62  Pulse: 97 87 96 89  Resp: 16 18 16 18   Temp: 97.8 F (36.6 C) 98.5 F (36.9 C) 98 F (36.7 C) 98.4 F (36.9 C)  TempSrc: Oral Oral Oral Oral  SpO2: 99% 100% 100% 100%  Weight:   79.2 kg   Height:        Intake/Output Summary (Last 24 hours) at 01/15/2018 1008 Last data filed at 01/15/2018 0900 Gross per 24 hour  Intake 960 ml  Output 750 ml  Net 210 ml   Filed Weights   12/23/17 1627 01/01/18 1700 01/14/18 1535  Weight: 77.4 kg 78.7 kg 79.2 kg   Examination:  General exam: Alert, awake, oriented x 3.   Respiratory system: Clear to auscultation. Respiratory effort normal. Cardiovascular system: normal s1,s2 sounds. No murmurs, rubs, gallops. Gastrointestinal system: Abdomen is nondistended, soft and nontender. No organomegaly or masses felt. Normal bowel sounds heard. Central nervous system: Alert and oriented. No focal neurological deficits. Extremities: No C/C/E, +pedal pulses.  Musculoskeletal: tenderness and spasm of lumbar paraspinal muscles.  Skin: No  rashes, lesions or ulcers.  Psychiatry: Judgement and insight appear normal. Mood & affect appropriate.   Data Reviewed: I have personally reviewed following labs and imaging studies  CBC: Recent Labs  Lab 01/11/18 0653  WBC 10.1  NEUTROABS 6.0  HGB 9.1*  HCT 29.3*  MCV 91.3  PLT 479*   Basic Metabolic Panel: Recent Labs  Lab 01/09/18 0529 01/11/18 0653 01/12/18 0535 01/14/18 0608  NA  --  136  --   --   K  --  4.0  --   --   CL  --  102  --   --   CO2  --  27  --   --   GLUCOSE  --  106*  --   --   BUN  --  20  --   --   CREATININE 0.58* 0.65 0.63 0.58*  CALCIUM  --  8.9  --   --    GFR: Estimated Creatinine Clearance: 139.9 mL/min (A) (by C-G formula based on SCr of 0.58 mg/dL (L)). Liver Function Tests: No results for input(s): AST, ALT, ALKPHOS, BILITOT, PROT, ALBUMIN in the last 168 hours. No results for input(s): LIPASE, AMYLASE in the last 168 hours. No results for input(s): AMMONIA in the last 168 hours. Coagulation Profile: No results for input(s): INR, PROTIME in the last 168  hours. Cardiac Enzymes: No results for input(s): CKTOTAL, CKMB, CKMBINDEX, TROPONINI in the last 168 hours. BNP (last 3 results) No results for input(s): PROBNP in the last 8760 hours. HbA1C: No results for input(s): HGBA1C in the last 72 hours. CBG: No results for input(s): GLUCAP in the last 168 hours. Lipid Profile: No results for input(s): CHOL, HDL, LDLCALC, TRIG, CHOLHDL, LDLDIRECT in the last 72 hours. Thyroid Function Tests: No results for input(s): TSH, T4TOTAL, FREET4, T3FREE, THYROIDAB in the last 72 hours. Anemia Panel: No results for input(s): VITAMINB12, FOLATE, FERRITIN, TIBC, IRON, RETICCTPCT in the last 72 hours. Sepsis Labs: No results for input(s): PROCALCITON, LATICACIDVEN in the last 168 hours.  No results found for this or any previous visit (from the past 240 hour(s)).  Radiology Studies: No results found.  Scheduled Meds: . busPIRone  10 mg Oral BID    . cyclobenzaprine  7.5 mg Oral TID  . enoxaparin (LOVENOX) injection  40 mg Subcutaneous Q24H  . feeding supplement (ENSURE ENLIVE)  237 mL Oral QID  . feeding supplement (PRO-STAT SUGAR FREE 64)  30 mL Oral TID  . multivitamin with minerals  1 tablet Oral Daily  . saccharomyces boulardii  250 mg Oral BID  . sodium chloride flush  10-40 mL Intracatheter Q12H  . sodium chloride flush  3 mL Intravenous Q12H  . vitamin C  500 mg Oral Daily   Continuous Infusions: . sodium chloride 500 mL (01/12/18 0547)  .  ceFAZolin (ANCEF) IV 2 g (01/15/18 0555)    LOS: 23 days   Time spent: 15 minutes  Standley Dakinslanford Cathan Gearin, MD [facility] Pager [phone]  If 7PM-7AM, please contact night-coverage [email or web address] Password [email or web address] 01/15/2018, 10:08 AM

## 2018-01-15 NOTE — Progress Notes (Signed)
Pharmacy Antibiotic Note  Today is day #21 of IV cefazolin therapy for this 32 yom admitted on 12/23/2017 for MSSA tricuspid valve endocarditis (per ECHO on 11/6). Patient has a history of IV drug abuse and says he has tolerated cephalexin in past, despite noted PCN allergy.  Patient is afebrile at this time and renal function is stable.    Plan: Continue Cefazolin 2 g IV q8h for a total of 8 weeks per ID (from 12/25/2017 through 02/17/18)  Monitor labs weekly  Height: 5\' 11"  (180.3 cm) Weight: 174 lb 8 oz (79.2 kg) IBW/kg (Calculated) : 75.3  Temp (24hrs), Avg:98.2 F (36.8 C), Min:98 F (36.7 C), Max:98.4 F (36.9 C)  Recent Labs  Lab 01/09/18 0529 01/11/18 0653 01/12/18 0535 01/14/18 0608  WBC  --  10.1  --   --   CREATININE 0.58* 0.65 0.63 0.58*    Estimated Creatinine Clearance: 139.9 mL/min (A) (by C-G formula based on SCr of 0.58 mg/dL (L)).    Allergies  Allergen Reactions  . Penicillins Anaphylaxis    Has patient had a PCN reaction causing immediate rash, facial/tongue/throat swelling, SOB or lightheadedness with hypotension: Yes Has patient had a PCN reaction causing severe rash involving mucus membranes or skin necrosis: Yes Has patient had a PCN reaction that required hospitalization:Yes Has patient had a PCN reaction occurring within the last 10 years: No If all of the above answers are "NO", then may proceed with Cephalosporin use.     Antimicrobials this admission: Vanco 11/5 >> 11/7 Aztreonam 11/5 >> 11/6 Levaquin 11/5 >>11/5 Cefazolin 11/7 >>  Microbiology results: 11/7 BXx: ng x5 days, final 11/5 BCx: MSSA 11/5 UCx: ng (final)  Thank you for allowing pharmacy to participate in this patient's care.  Tama Highamara Khalib Fendley, Pharm. D. Clinical Pharmacist 01/15/2018 11:36 AM

## 2018-01-16 NOTE — Progress Notes (Signed)
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  Tricuspid valve vegetation noted on echo on 12/24/2017.  Unfortunately the patient is not a candidate for home antibiotics due to his ongoing drug use. - Continue IV cefazolin started 12/25/2017 to continue thru 02/17/18.   - Blood cultures 12/23/2017 growing out MSSA - Blood cultures from 12/25/2017 no growth to date,  PICC line placed on 11/11 - ID consult in chart.    - Cardiology consulted, no plan for TEE, since vegetation seen on TTE -  Will need infectious disease follow-up after discharge prior to discontinuing antibiotics. -  Pt is being counseled daily on the importance of completing the full course of treatment for this life threatening infection.  -  Pt is High Risk for leaving AMA.  If we can get him to complete 6 weeks of IV therapy without leaving AMA, I would discuss with ID if he could go home on an oral regimen for the remaining 2 weeks.  That plan seemed to calm the patient down today. He has been threatening to leave as he begins to feel better and recover from the infection.    Septic pulmonary emboli: Noted on CT of the chest.  Hemoptysis is nearly resolved.  Iron Deficiency Anemia: Stable at this time.  Likely multifactorial  including bone marrow suppression from sepsis, anemia of chronic disease and iron deficiency anemia based on lab work -Supplemented with [employer].   His Hg is improving.   Abdominal pain/diarrhea: Resolved.    Left shoulder myositis and septic bursitis: Noted on MRI on 12/25/2017. -Orthopedic surgery does not recommend any aspiration as there is too little fluid  L3/4 osteomyelitis, paraspinous phlegmon, right psoas muscle abscess: All likely secondary to endocarditis.  The abscess is not significant sufficiently large for drainage at this time. -Antibiotics plan as noted above -Continue pain control  Opioid induced Constipation - laxatives ordered as needed.  Pt reporting regular bowel movements.   IV drug use:  -Pt interested in following up with behavioral health -Patient is good candidate for suboxone treatment program after he is discharged.  Social worker has been speaking with him but he does not seem interested at this time.     Chronic hepatitis C: -Outpatient treatment, plan to have patient follow up with Infectious Diseases Clinic after discharge.   Generalized Anxiety Disorder -BuSpar (buspirone); increased dose to 10 mg p.o. twice daily on 11/28 and titrate as needed for symptom relief.  Trying to avoid benzos in the setting of ongoing substance abuse and opioid dependence.    DVT prophylaxis: Heparin Code Status: Full Family Communication: Mother updated at bedside Disposition Plan: Continue IV Ancef through 12/31 for full 8-week course of treatment as recommended initially by ID.  PICC line has been placed.  Per CM team, pt was not  accepted at any SNF facilities due to his drug abuse.  Update 11/25: Mother says that his [place] has been approved.  Pt will have to remain in hospital until full course of treatment has been completed.    Consultants:   ID  Cardiology  Orthopedic surgery  Procedures:  Echo 12/24/2017:- Left ventricle: The cavity size was  normal. Wall thickness was normal. Systolic function was normal. The estimated ejectionfraction was in the range of 55% to 60%. Wall motion was normal; there were no regional wall motion abnormalities. - Mitral valve: There was trivial regurgitation. - Right atrium: Central venous pressure (est): 3 mm Hg. - Atrial septum: No defect or patent foramen ovale was identified. - Tricuspid valve: Large, homogeneous echodensity associated withthe atrial side of the septal tricuspid leaflet consistent with vegetation in the setting of documented bacteremia. There wasmoderate regurgitation. - Pulmonary arteries: PA peak pressure: 28 mm Hg (S). - Pericardium, extracardiac: A moderate pericardial effusion wasidentified posterior to the heart.  Antimicrobials:   IV vancomycin started 12/23/2017 to 12/25/2017  IV aztreonam 12/23/2017 to 12/24/2017  IV cefazolin started 12/25/2017 to ongoing  Subjective: Pt says he continues to have back spasms after walking but pain controlled with ice packs.  He has been ambulating without the walker more frequently.   He is wanting to discharge.  He reports "cabin fever."       Objective: Vitals:   01/15/18 1955 01/15/18 2110 01/15/18 2111 01/16/18 0546  BP:  105/74  (!) 96/55  Pulse:  93  93  Resp:      Temp:   98 F (36.7 C) 98.9 F (37.2 C)  TempSrc:   Oral Oral  SpO2: 98% 100%  99%  Weight:      Height:        Intake/Output Summary (Last 24 hours) at 01/16/2018 1037 Last data filed at 01/16/2018 0900 Gross per 24 hour  Intake 480 ml  Output -  Net 480 ml   Filed Weights   12/23/17 1627 01/01/18 1700 01/14/18 1535  Weight: 77.4 kg 78.7 kg 79.2 kg   Examination:  General exam: Alert, awake, oriented x 3.   Respiratory system: Clear to auscultation. Respiratory effort normal. Cardiovascular system: normal s1,s2 sounds. No murmurs, rubs, gallops. Gastrointestinal system: Abdomen is nondistended, soft and nontender. No organomegaly or  masses felt. Normal bowel sounds heard. Central nervous system: Alert and oriented. No focal neurological deficits. Extremities: No C/C/E, +pedal pulses.  Musculoskeletal: tenderness and spasm of lumbar paraspinal muscles.  Skin: No rashes, lesions or ulcers.  Psychiatry: Judgement and insight appear normal. Mood & affect appropriate.   Data Reviewed: I have personally reviewed following labs and imaging studies  CBC: Recent Labs  Lab 01/11/18 0653  WBC 10.1  NEUTROABS 6.0  HGB 9.1*  HCT 29.3*  MCV 91.3  PLT 479*   Basic Metabolic Panel: Recent Labs  Lab 01/11/18 0653 01/12/18 0535 01/14/18 0608  NA 136  --   --   K 4.0  --   --   CL 102  --   --   CO2 27  --   --   GLUCOSE 106*  --   --   BUN 20  --   --   CREATININE 0.65 0.63 0.58*  CALCIUM 8.9  --   --    GFR: Estimated Creatinine Clearance: 139.9 mL/min (A) (by C-G formula based on SCr of 0.58 mg/dL (L)). Liver Function Tests: No results for input(s): AST, ALT, ALKPHOS, BILITOT,  PROT, ALBUMIN in the last 168 hours. No results for input(s): LIPASE, AMYLASE in the last 168 hours. No results for input(s): AMMONIA in the last 168 hours. Coagulation Profile: No results for input(s): INR, PROTIME in the last 168 hours. Cardiac Enzymes: No results for input(s): CKTOTAL, CKMB, CKMBINDEX, TROPONINI in the last 168 hours. BNP (last 3 results) No results for input(s): PROBNP in the last 8760 hours. HbA1C: No results for input(s): HGBA1C in the last 72 hours. CBG: No results for input(s): GLUCAP in the last 168 hours. Lipid Profile: No results for input(s): CHOL, HDL, LDLCALC, TRIG, CHOLHDL, LDLDIRECT in the last 72 hours. Thyroid Function Tests: No results for input(s): TSH, T4TOTAL, FREET4, T3FREE, THYROIDAB in the last 72 hours. Anemia Panel: No results for input(s): VITAMINB12, FOLATE, FERRITIN, TIBC, IRON, RETICCTPCT in the last 72 hours. Sepsis Labs: No results for input(s): PROCALCITON, LATICACIDVEN in the  last 168 hours.  No results found for this or any previous visit (from the past 240 hour(s)).  Radiology Studies: No results found.  Scheduled Meds: . busPIRone  10 mg Oral BID  . cyclobenzaprine  7.5 mg Oral TID  . enoxaparin (LOVENOX) injection  40 mg Subcutaneous Q24H  . feeding supplement (ENSURE ENLIVE)  237 mL Oral QID  . feeding supplement (PRO-STAT SUGAR FREE 64)  30 mL Oral TID  . multivitamin with minerals  1 tablet Oral Daily  . saccharomyces boulardii  250 mg Oral BID  . sodium chloride flush  10-40 mL Intracatheter Q12H  . sodium chloride flush  3 mL Intravenous Q12H  . vitamin C  500 mg Oral Daily   Continuous Infusions: . sodium chloride 500 mL (01/12/18 0547)  .  ceFAZolin (ANCEF) IV 2 g (01/16/18 0617)    LOS: 24 days   Time spent: 20 minutes  Standley Dakins, MD [facility] Pager [phone]  If 7PM-7AM, please contact night-coverage [email or web address] Password [facility] 01/16/2018, 10:37 AM

## 2018-01-16 NOTE — Progress Notes (Signed)
Physical Therapy Treatment Patient Details Name: Howard Diaz MRN: [id number] DOB: 1985/03/07 Today's Date: 01/16/2018    History of Present Illness 32 yo male with onset of sepsis after onset of osteomyelitis on lumbar spine from paraspinous abscess.  Has acute hepatic toxicity and lactic acidosis, AKI, new PICC line for ABT.  PMHx:  EtOH abuse, Hepatitis C, IV drug use,     PT Comments    Patient reported he had begun walking without AD 2 days ago. Today patient ambulated 300 feet including ramps and 2 flights of stairs without AD, using near normal gait speed and short equal length steps and erect posture throughout.  Patient would continue to benefit from skilled physical therapy in current environment and next venue to continue return to prior function and increase strength, endurance, balance, coordination, and functional mobility and gait skills.     Follow Up Recommendations  SNF;Supervision/Assistance - 24 hour;Supervision for mobility/OOB     Equipment Recommendations  Rolling walker with 5" wheels    Recommendations for Other Services       Precautions / Restrictions Restrictions Weight Bearing Restrictions: No    Mobility  Bed Mobility Overal bed mobility: Modified Independent Bed Mobility: Sit to Supine;Supine to Sit     Supine to sit: Supervision Sit to supine: Supervision   General bed mobility comments: without use of bed rail, head of bed slightly raised  Transfers Overall transfer level: Needs assistance Equipment used: None Transfers: Sit to/from [employer] Transfers Sit to Stand: Supervision Stand pivot transfers: Supervision       General transfer comment: labored movement  Ambulation/Gait Ambulation/Gait assistance: Supervision Gait Distance (Feet): 300 Feet Assistive device: None Gait Pattern/deviations: Decreased step length - right;Decreased step length - left;Decreased stride length Gait velocity: somewhat decreased    General Gait Details: slightly labored cadence with near normal speed, narrow base of support, short step/stride length, good return for going up/down ramps and stairs without loss of balance   Stairs Stairs: Yes   Stair Management: One rail Right;Alternating pattern;Forwards Number of Stairs: 20     Wheelchair Mobility    Modified Rankin (Stroke Patients Only)       Balance Overall balance assessment: Needs assistance Sitting-balance support: Feet supported;No upper extremity supported Sitting balance-Leahy Scale: Good     Standing balance support: During functional activity;No upper extremity supported Standing balance-Leahy Scale: Fair                              Cognition Arousal/Alertness: Awake/alert Behavior During Therapy: WFL for tasks assessed/performed Overall Cognitive Status: Within Functional Limits for tasks assessed                                        Exercises      General Comments        Pertinent Vitals/Pain Pain Assessment: Faces Faces Pain Scale: Hurts little more Pain Location: low back Pain Descriptors / Indicators: Sore;Discomfort Pain Intervention(s): Limited activity within patient's tolerance    Home Living                      Prior Function            PT Goals (current goals can now be found in the care plan section) Acute Rehab PT Goals Patient Stated Goal: to get home PT Goal  Formulation: With patient/family Time For Goal Achievement: 01/30/18 Potential to Achieve Goals: Good    Frequency    Min 3X/week      PT Plan Current plan remains appropriate    Co-evaluation              AM-PAC PT "6 Clicks" Mobility   Outcome Measure  Help needed turning from your back to your side while in a flat bed without using bedrails?: A Little Help needed moving from lying on your back to sitting on the side of a flat bed without using bedrails?: A Little Help needed moving to  and from a bed to a chair (including a wheelchair)?: A Little Help needed standing up from a chair using your arms (e.g., wheelchair or bedside chair)?: None Help needed to walk in hospital room?: None Help needed climbing 3-5 steps with a railing? : A Little 6 Click Score: 20    End of Session   Activity Tolerance: Patient tolerated treatment well Patient left: in bed;with family/visitor present;with nursing/sitter in room(seated at bedside) Nurse Communication: Mobility status PT Visit Diagnosis: Unsteadiness on feet (R26.81);Muscle weakness (generalized) (M62.81);Pain;Other abnormalities of gait and mobility (R26.89)     Time: [id number] PT Time Calculation (min) (ACUTE ONLY): 15 min  Charges:  $Gait Training: 8-22 mins                     Katina DungBarbara D. Hartnett-Rands, MS, PT [id number] 01/16/2018, 3:46 PM

## 2018-01-16 NOTE — Plan of Care (Signed)
  Problem: Acute Rehab PT Goals(only PT should resolve) Goal: Pt Will Go Supine/Side To Sit Outcome: Progressing Flowsheets (Taken 01/16/2018 1548) Pt will go Supine/Side to Sit: with modified independence Goal: Pt Will Transfer Bed To Chair/Chair To Bed Outcome: Progressing Flowsheets (Taken 01/16/2018 1548) Pt will Transfer Bed to Chair/Chair to Bed: with modified independence Goal: Pt Will Perform Standing Balance Or Pre-Gait Outcome: Progressing Flowsheets (Taken 01/16/2018 1548) Pt will perform standing balance or pre-gait : with Modified Independent Goal: Pt Will Ambulate Outcome: Progressing Flowsheets (Taken 01/16/2018 1548) Pt will Ambulate: with modified independence; > 125 feet; with least restrictive assistive device Goal: Pt Will Go Up/Down Stairs Outcome: Progressing Flowsheets (Taken 01/16/2018 1548) Pt will Go Up / Down Stairs: with modified independence  Britta MccreedyBarbara D. Hartnett-Rands, MS, PT [facility] [phone]#[id number] 01/16/2018

## 2018-01-17 LAB — CBC
HCT: 30.3 % — ABNORMAL LOW (ref 39.0–52.0)
Hemoglobin: 9.2 g/dL — ABNORMAL LOW (ref 13.0–17.0)
MCH: 27.8 pg (ref 26.0–34.0)
MCHC: 30.4 g/dL (ref 30.0–36.0)
MCV: 91.5 fL (ref 80.0–100.0)
NRBC: 0 % (ref 0.0–0.2)
PLATELETS: 363 10*3/uL (ref 150–400)
RBC: 3.31 MIL/uL — AB (ref 4.22–5.81)
RDW: 15.4 % (ref 11.5–15.5)
WBC: 6.8 10*3/uL (ref 4.0–10.5)

## 2018-01-17 MED ORDER — OXYCODONE HCL 5 MG PO TABS
5.0000 mg | ORAL_TABLET | ORAL | Status: DC | PRN
  Administered 2018-01-17 – 2018-01-20 (×11): 5 mg via ORAL
  Filled 2018-01-17 (×12): qty 1

## 2018-01-17 NOTE — Progress Notes (Signed)
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  Tricuspid valve vegetation noted on echo on 12/24/2017.  Unfortunately the patient is not a candidate for home antibiotics due to his ongoing drug use. - Continue IV cefazolin started 12/25/2017 to continue thru 02/17/18.   - Blood cultures 12/23/2017 growing out MSSA - Blood cultures from 12/25/2017 no growth to date,  PICC line placed on 11/11 - ID consult in chart.    - Cardiology consulted, no plan for TEE, since vegetation seen on TTE -  Will need infectious disease follow-up after discharge prior to discontinuing antibiotics. -  Pt is being counseled daily on the importance of completing the full course of treatment for this life threatening infection.  -  Pt is High Risk for leaving AMA.  If we can get him to complete 6 weeks of IV therapy without leaving AMA, I would discuss with ID if he could go home on an oral regimen for the remaining 2 weeks.  That plan seemed to calm the patient down today. He has been threatening to leave as he begins to feel better and recover from the infection.    Septic pulmonary emboli: Noted on CT of the chest.  Hemoptysis is nearly resolved.  Iron Deficiency Anemia: Stable at this time.  Likely multifactorial  including bone marrow suppression from sepsis, anemia of chronic disease and iron deficiency anemia based on lab work -Supplemented with [employer].   His Hg is improving.   Abdominal pain/diarrhea: Resolved.    Left shoulder myositis and septic bursitis: Noted on MRI on 12/25/2017. -Orthopedic surgery does not recommend any aspiration as there is too little fluid  L3/4 osteomyelitis, paraspinous phlegmon, right psoas muscle abscess: All likely secondary to endocarditis.  The abscess is not significant sufficiently large for drainage at this time. -Antibiotics plan as noted above -Continue pain control, will try to taper oxycodone  Opioid induced Constipation - laxatives ordered as needed.  Pt reporting regular bowel movements.   IV drug use:  -Pt interested in following up with behavioral health -Patient is good candidate for suboxone treatment program after he is discharged.  Social worker has been speaking with him but he does not seem interested at this time.     Chronic hepatitis C: -Outpatient treatment, plan to have patient follow up with Infectious Diseases Clinic after discharge.   Generalized Anxiety Disorder -BuSpar (buspirone); increased dose to 10 mg p.o. twice daily on 11/28 and titrate as needed for symptom relief.  Trying to avoid benzos in the setting of ongoing substance abuse and opioid dependence.    DVT prophylaxis: Heparin Code Status: Full Family Communication: Mother updated at bedside Disposition Plan: Continue IV Ancef through 12/31 for full 8-week course of treatment as recommended initially by ID.  PICC line has been placed.  Per  CM team, pt was not accepted at any SNF facilities due to his drug abuse. Pt will have to remain in hospital until full course of treatment has been completed.    Consultants:   ID  Cardiology  Orthopedic surgery  Procedures:  Echo 12/24/2017:- Left ventricle: The cavity size was normal. Wall thickness was normal.  Systolic function was normal. The estimated ejectionfraction was in the range of 55% to 60%. Wall motion was normal; there were no regional wall motion abnormalities. - Mitral valve: There was trivial regurgitation. - Right atrium: Central venous pressure (est): 3 mm Hg. - Atrial septum: No defect or patent foramen ovale was identified. - Tricuspid valve: Large, homogeneous echodensity associated withthe atrial side of the septal tricuspid leaflet consistent with vegetation in the setting of documented bacteremia. There wasmoderate regurgitation. - Pulmonary arteries: PA peak pressure: 28 mm Hg (S). - Pericardium, extracardiac: A moderate pericardial effusion wasidentified posterior to the heart.  Antimicrobials:   IV vancomycin started 12/23/2017 to 12/25/2017  IV aztreonam 12/23/2017 to 12/24/2017  IV cefazolin started 12/25/2017 to ongoing  Subjective: Overall he is feeling better.  Reports substantial improvement in his back pain.  Shoulders no longer hurt.  Able to ambulate better.  He is no longer requiring a walker.     Objective: Vitals:   01/16/18 0546 01/16/18 1510 01/16/18 2117 01/17/18 0516  BP: (!) 96/55 109/70 108/63 92/64  Pulse: 93 90 95 88  Resp:  18 18 18   Temp: 98.9 F (37.2 C) 97.8 F (36.6 C) 98.4 F (36.9 C) 98.4 F (36.9 C)  TempSrc: Oral Oral Oral Oral  SpO2: 99% 99% 99% 100%  Weight:      Height:        Intake/Output Summary (Last 24 hours) at 01/17/2018 1948 Last data filed at 01/17/2018 1700 Gross per 24 hour  Intake 720 ml  Output 300 ml  Net 420 ml   Filed Weights   12/23/17 1627 01/01/18 1700 01/14/18 1535  Weight: 77.4 kg 78.7 kg 79.2 kg   Examination:  General exam: Alert, awake, oriented x 3 Respiratory system: Clear to auscultation. Respiratory effort normal. Cardiovascular system:RRR. No murmurs, rubs, gallops. Gastrointestinal system: Abdomen is nondistended, soft and nontender. No organomegaly or masses felt. Normal bowel  sounds heard. Central nervous system: Alert and oriented. No focal neurological deficits. Extremities: No C/C/E, +pedal pulses Skin: No rashes, lesions or ulcers Psychiatry: Judgement and insight appear normal. Mood & affect appropriate.    Data Reviewed: I have personally reviewed following labs and imaging studies  CBC: Recent Labs  Lab 01/11/18 0653 01/17/18 0621  WBC 10.1 6.8  NEUTROABS 6.0  --   HGB 9.1* 9.2*  HCT 29.3* 30.3*  MCV 91.3 91.5  PLT 479* 363   Basic Metabolic Panel: Recent Labs  Lab 01/11/18 0653 01/12/18 0535 01/14/18 0608  NA 136  --   --   K 4.0  --   --   CL 102  --   --   CO2 27  --   --   GLUCOSE 106*  --   --   BUN 20  --   --   CREATININE 0.65 0.63 0.58*  CALCIUM 8.9  --   --    GFR: Estimated Creatinine Clearance: 139.9 mL/min (A) (by C-G formula based on SCr of 0.58 mg/dL (L)). Liver Function Tests: No results for input(s): AST, ALT, ALKPHOS, BILITOT, PROT, ALBUMIN in the last 168 hours. No results for input(s): LIPASE, AMYLASE in the  last 168 hours. No results for input(s): AMMONIA in the last 168 hours. Coagulation Profile: No results for input(s): INR, PROTIME in the last 168 hours. Cardiac Enzymes: No results for input(s): CKTOTAL, CKMB, CKMBINDEX, TROPONINI in the last 168 hours. BNP (last 3 results) No results for input(s): PROBNP in the last 8760 hours. HbA1C: No results for input(s): HGBA1C in the last 72 hours. CBG: No results for input(s): GLUCAP in the last 168 hours. Lipid Profile: No results for input(s): CHOL, HDL, LDLCALC, TRIG, CHOLHDL, LDLDIRECT in the last 72 hours. Thyroid Function Tests: No results for input(s): TSH, T4TOTAL, FREET4, T3FREE, THYROIDAB in the last 72 hours. Anemia Panel: No results for input(s): VITAMINB12, FOLATE, FERRITIN, TIBC, IRON, RETICCTPCT in the last 72 hours. Sepsis Labs: No results for input(s): PROCALCITON, LATICACIDVEN in the last 168 hours.  No results found for this or any  previous visit (from the past 240 hour(s)).  Radiology Studies: No results found.  Scheduled Meds: . busPIRone  10 mg Oral BID  . cyclobenzaprine  7.5 mg Oral TID  . enoxaparin (LOVENOX) injection  40 mg Subcutaneous Q24H  . feeding supplement (ENSURE ENLIVE)  237 mL Oral QID  . feeding supplement (PRO-STAT SUGAR FREE 64)  30 mL Oral TID  . multivitamin with minerals  1 tablet Oral Daily  . saccharomyces boulardii  250 mg Oral BID  . sodium chloride flush  10-40 mL Intracatheter Q12H  . sodium chloride flush  3 mL Intravenous Q12H  . vitamin C  500 mg Oral Daily   Continuous Infusions: . sodium chloride 500 mL (01/12/18 0547)  .  ceFAZolin (ANCEF) IV 2 g (01/17/18 1623)    LOS: 25 days   Time spent: 20 minutes  Erick BlinksJehanzeb , MD [facility] Pager [phone]  If 7PM-7AM, please contact night-coverage [email or web address] Password [email or web address] 01/17/2018, 7:48 PM

## 2018-01-18 MED ORDER — ALPRAZOLAM 1 MG PO TABS
1.0000 mg | ORAL_TABLET | ORAL | Status: AC
  Administered 2018-01-18: 1 mg via ORAL
  Filled 2018-01-18: qty 1

## 2018-01-18 MED ORDER — HYDROXYZINE HCL 25 MG PO TABS
50.0000 mg | ORAL_TABLET | Freq: Three times a day (TID) | ORAL | Status: DC | PRN
  Administered 2018-01-18: 50 mg via ORAL
  Filled 2018-01-18: qty 2

## 2018-01-18 NOTE — Progress Notes (Signed)
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  Tricuspid valve vegetation noted on echo on 12/24/2017.  Unfortunately the patient is not a candidate for home antibiotics due to his ongoing drug use. - Continue IV cefazolin started 12/25/2017 to continue thru 02/17/18.   - Blood cultures 12/23/2017 growing out MSSA - Blood cultures from 12/25/2017 no growth to date,  PICC line placed on 11/11 - ID consult in chart.    - Cardiology consulted, no plan for TEE, since vegetation seen on TTE -  Will need infectious disease follow-up after discharge prior to discontinuing antibiotics. -  Pt is being counseled daily on the importance of completing the full course of treatment for this life threatening infection.  -  Pt is High Risk for leaving AMA.  If we can get him to complete 6 weeks of IV therapy without leaving AMA, I would discuss with ID if he could go home on an oral regimen for the remaining 2 weeks.  That plan seemed to calm the patient down today. He has been threatening to leave as he begins to feel better and recover from the infection.    Septic pulmonary emboli: Noted on CT of the chest.  Hemoptysis is nearly resolved.  Iron Deficiency Anemia: Stable at this time.  Likely multifactorial  including bone marrow suppression from sepsis, anemia of chronic disease and iron deficiency anemia based on lab work -Supplemented with [employer].   His Hg is improving.   Abdominal pain/diarrhea: Resolved.    Left shoulder myositis and septic bursitis: Noted on MRI on 12/25/2017. -Orthopedic surgery does not recommend any aspiration as there is too little fluid  L3/4 osteomyelitis, paraspinous phlegmon, right psoas muscle abscess: All likely secondary to endocarditis.  The abscess is not significant sufficiently large for drainage at this time. -Antibiotics plan as noted above -Continue pain control, will try to taper oxycodone  Opioid induced Constipation - laxatives ordered as needed.  Pt reporting regular bowel movements.   IV drug use:  -Pt interested in following up with behavioral health -Patient is good candidate for suboxone treatment program after he is discharged.  Social worker has been speaking with him but he does not seem interested at this time.     Chronic hepatitis C: -Outpatient treatment, plan to have patient follow up with Infectious Diseases Clinic after discharge.   Generalized Anxiety Disorder -Currently on BuSpar.  We will add Vistaril.  If no relief, may need to consider small dose of benzodiazepines, but we are trying to avoid this.    DVT prophylaxis: Heparin Code Status: Full Family Communication: Mother updated at bedside Disposition Plan: Continue IV Ancef through 12/31 for full 8-week course of treatment as recommended initially by ID.  PICC line has been placed.  Per CM team, pt was not accepted at  any SNF facilities due to his drug abuse. Pt will have to remain in hospital until full course of treatment has been completed.    Consultants:   ID  Cardiology  Orthopedic surgery  Procedures:  Echo 12/24/2017:- Left ventricle: The cavity size was normal. Wall thickness was normal. Systolic function was normal. The estimated ejectionfraction  was in the range of 55% to 60%. Wall motion was normal; there were no regional wall motion abnormalities. - Mitral valve: There was trivial regurgitation. - Right atrium: Central venous pressure (est): 3 mm Hg. - Atrial septum: No defect or patent foramen ovale was identified. - Tricuspid valve: Large, homogeneous echodensity associated withthe atrial side of the septal tricuspid leaflet consistent with vegetation in the setting of documented bacteremia. There wasmoderate regurgitation. - Pulmonary arteries: PA peak pressure: 28 mm Hg (S). - Pericardium, extracardiac: A moderate pericardial effusion wasidentified posterior to the heart.  Antimicrobials:   IV vancomycin started 12/23/2017 to 12/25/2017  IV aztreonam 12/23/2017 to 12/24/2017  IV cefazolin started 12/25/2017 to ongoing  Subjective: Reports that he did not have a good night.  He has been having panic attacks complains of severe anxiety.  Reports that he had this in the past and was treated with Xanax.  Because of his anxiety, he feels that his pain is worse.  He was threatening to leave last night.     Objective: Vitals:   01/17/18 0516 01/17/18 2140 01/18/18 0558 01/18/18 1307  BP: 92/64 103/61 98/67 122/73  Pulse: 88 94 96 (!) 109  Resp: 18 18 18 18   Temp: 98.4 F (36.9 C) 98.4 F (36.9 C) 98.5 F (36.9 C) 98.3 F (36.8 C)  TempSrc: Oral Oral Oral Oral  SpO2: 100% 100% 98% 99%  Weight:      Height:        Intake/Output Summary (Last 24 hours) at 01/18/2018 1752 Last data filed at 01/18/2018 1751 Gross per 24 hour  Intake 2260 ml  Output 1750 ml  Net 510 ml   Filed Weights   12/23/17 1627 01/01/18 1700 01/14/18 1535  Weight: 77.4 kg 78.7 kg 79.2 kg   Examination:  General exam: Alert, awake, oriented x 3 Respiratory system: Clear to auscultation. Respiratory effort normal. Cardiovascular system:RRR. No murmurs, rubs, gallops. Gastrointestinal system: Abdomen is nondistended, soft and nontender. No  organomegaly or masses felt. Normal bowel sounds heard. Central nervous system: Alert and oriented. No focal neurological deficits. Extremities: No C/C/E, +pedal pulses Skin: No rashes, lesions or ulcers Psychiatry: Appears anxious    Data Reviewed: I have personally reviewed following labs and imaging studies  CBC: Recent Labs  Lab 01/17/18 0621  WBC 6.8  HGB 9.2*  HCT 30.3*  MCV 91.5  PLT 363   Basic Metabolic Panel: Recent Labs  Lab 01/12/18 0535 01/14/18 0608  CREATININE 0.63 0.58*   GFR: Estimated Creatinine Clearance: 139.9 mL/min (A) (by C-G formula based on SCr of 0.58 mg/dL (L)). Liver Function Tests: No results for input(s): AST, ALT, ALKPHOS, BILITOT, PROT, ALBUMIN in the last 168 hours. No results for input(s): LIPASE, AMYLASE in the last 168 hours. No results for input(s): AMMONIA in the last 168 hours. Coagulation Profile: No results for input(s): INR, PROTIME in the last 168 hours. Cardiac Enzymes: No results for input(s): CKTOTAL, CKMB, CKMBINDEX, TROPONINI in the last 168 hours. BNP (last 3 results) No results for input(s): PROBNP in the last 8760 hours. HbA1C: No results for input(s): HGBA1C in the last 72 hours. CBG: No results for  input(s): GLUCAP in the last 168 hours. Lipid Profile: No results for input(s): CHOL, HDL, LDLCALC, TRIG, CHOLHDL, LDLDIRECT in the last 72 hours. Thyroid Function Tests: No results for input(s): TSH, T4TOTAL, FREET4, T3FREE, THYROIDAB in the last 72 hours. Anemia Panel: No results for input(s): VITAMINB12, FOLATE, FERRITIN, TIBC, IRON, RETICCTPCT in the last 72 hours. Sepsis Labs: No results for input(s): PROCALCITON, LATICACIDVEN in the last 168 hours.  No results found for this or any previous visit (from the past 240 hour(s)).  Radiology Studies: No results found.  Scheduled Meds: . busPIRone  10 mg Oral BID  . cyclobenzaprine  7.5 mg Oral TID  . enoxaparin (LOVENOX) injection  40 mg Subcutaneous Q24H  .  feeding supplement (ENSURE ENLIVE)  237 mL Oral QID  . feeding supplement (PRO-STAT SUGAR FREE 64)  30 mL Oral TID  . multivitamin with minerals  1 tablet Oral Daily  . saccharomyces boulardii  250 mg Oral BID  . sodium chloride flush  10-40 mL Intracatheter Q12H  . sodium chloride flush  3 mL Intravenous Q12H  . vitamin C  500 mg Oral Daily   Continuous Infusions: . sodium chloride Stopped (01/18/18 0618)  .  ceFAZolin (ANCEF) IV 2 g (01/18/18 1402)    LOS: 26 days   Time spent: 20 minutes  Erick BlinksJehanzeb Memon, MD [facility] Pager [phone]  If 7PM-7AM, please contact night-coverage [email or web address] Password [email or web address] 01/18/2018, 5:52 PM

## 2018-01-19 LAB — CREATININE, SERUM
CREATININE: 0.54 mg/dL — AB (ref 0.61–1.24)
GFR calc Af Amer: >60 mL/min (ref >60)

## 2018-01-19 MED ORDER — ALPRAZOLAM 0.5 MG PO TABS
0.5000 mg | ORAL_TABLET | Freq: Three times a day (TID) | ORAL | Status: DC | PRN
  Administered 2018-01-19: 0.5 mg via ORAL
  Filled 2018-01-19: qty 1

## 2018-01-19 MED ORDER — BUSPIRONE HCL 5 MG PO TABS
15.0000 mg | ORAL_TABLET | Freq: Three times a day (TID) | ORAL | Status: DC
  Administered 2018-01-19 – 2018-01-21 (×7): 15 mg via ORAL
  Filled 2018-01-19 (×6): qty 3

## 2018-01-19 MED ORDER — CYCLOBENZAPRINE HCL 10 MG PO TABS
5.0000 mg | ORAL_TABLET | Freq: Three times a day (TID) | ORAL | Status: DC
  Administered 2018-01-19 – 2018-01-21 (×5): 5 mg via ORAL
  Filled 2018-01-19 (×5): qty 1

## 2018-01-19 MED ORDER — CLONAZEPAM 0.5 MG PO TABS
0.5000 mg | ORAL_TABLET | Freq: Three times a day (TID) | ORAL | Status: DC | PRN
  Administered 2018-01-19 – 2018-01-21 (×4): 0.5 mg via ORAL
  Filled 2018-01-19 (×4): qty 1

## 2018-01-19 MED ORDER — BUSPIRONE HCL 5 MG PO TABS: 15.0000 mg | ORAL_TABLET | Freq: Two times a day (BID) | ORAL | Status: DC

## 2018-01-19 NOTE — Progress Notes (Signed)
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  Tricuspid valve vegetation noted on echo on 12/24/2017.  Unfortunately the patient is not a candidate for home antibiotics due to his ongoing drug use. - Continue IV cefazolin started 12/25/2017 to continue thru 02/17/18.   - Blood cultures 12/23/2017 growing out MSSA - Blood cultures from 12/25/2017 no growth to date,  PICC line placed on 11/11 - ID consult in chart.    - Cardiology consulted, no plan for TEE, since vegetation seen on TTE -  Will need infectious disease follow-up after discharge prior to discontinuing antibiotics. -  Pt is being counseled daily on the importance of completing the full course of treatment for this life threatening infection.  -  Pt is High Risk for leaving AMA.  If we can get him to complete 6 weeks of IV therapy without leaving AMA, I would discuss with ID if he could go home on an oral regimen for the remaining 2 weeks.  I counseled patient at length about dangers of leaving AMA and abandoning treatment.  We cannot allow him to leave hospital without removing PICC line.    Septic pulmonary emboli: Noted on CT of the chest.  Hemoptysis is nearly resolved.  Iron Deficiency Anemia: Stable at this time.  Likely  multifactorial including bone marrow suppression from sepsis, anemia of chronic disease and iron deficiency anemia based on lab work -Supplemented with [employer].   His Hg is improving.   Abdominal pain/diarrhea: Resolved.    Left shoulder myositis and septic bursitis: Noted on MRI on 12/25/2017. -Orthopedic surgery does not recommend any aspiration as there is too little fluid  L3/4 osteomyelitis, paraspinous phlegmon, right psoas muscle abscess: All likely secondary to endocarditis.  The abscess is not significant sufficiently large for drainage at this time. -Antibiotics plan as noted above -Continue pain control, will try to taper oxycodone  Opioid induced Constipation - laxatives ordered as needed.  Pt reporting regular bowel movements.   IV drug use:  -Pt interested in following up with behavioral health  Chronic hepatitis C: -Outpatient treatment, plan to have patient follow up with Infectious Diseases Clinic after discharge.   Generalized Anxiety Disorder -symptoms remain uncontrolled.  Titrate buspar to 15 mg BID.  Added alprazolam prn.  Consult to TTS for management recommendations.     DVT prophylaxis: Heparin Code Status: Full Family Communication: Mother updated at bedside Disposition Plan: Continue IV Ancef through 12/31 for full 8-week course of treatment as recommended initially by ID.  PICC line has been placed.  Per CM team, pt was not accepted at any SNF facilities due to his drug abuse. Pt will have to remain in hospital until full course of treatment has been completed.    Consultants:   ID  Cardiology  Orthopedic surgery  Procedures:  Echo 12/24/2017:- Left ventricle: The cavity size was normal. Wall thickness was normal. Systolic function was normal. The estimated ejectionfraction was in the range of 55% to 60%. Wall motion was normal; there were no regional wall motion abnormalities. - Mitral valve: There was trivial regurgitation. - Right  atrium: Central venous pressure (est): 3 mm Hg. - Atrial septum: No defect or patent foramen ovale was identified. - Tricuspid valve: Large, homogeneous echodensity associated withthe atrial side of the septal tricuspid leaflet consistent with vegetation in the setting of documented bacteremia. There wasmoderate regurgitation. - Pulmonary arteries: PA peak pressure: 28 mm Hg (S). - Pericardium, extracardiac: A moderate pericardial effusion wasidentified posterior to the heart.  Antimicrobials:   IV vancomycin started 12/23/2017 to 12/25/2017  IV aztreonam 12/23/2017 to 12/24/2017  IV cefazolin started 12/25/2017 to ongoing  Subjective: Pt continues to complain of anxiety and threatens to leave AMA.  I had a long discussion with him this morning and risks of leaving AMA discussed including death.  Pt verbalized understanding.       Objective: Vitals:   01/18/18 0558 01/18/18 1307 01/18/18 2118 01/19/18 0530  BP: 98/67 122/73 104/67 (!) 95/59  Pulse: 96 (!) 109 86 97  Resp: 18 18 18 18   Temp: 98.5 F (36.9 C) 98.3 F (36.8 C) 98.5 F (36.9 C) 98.4 F (36.9 C)  TempSrc: Oral Oral Oral Oral  SpO2: 98% 99% 99% 90%  Weight:      Height:        Intake/Output Summary (Last 24 hours) at 01/19/2018 0851 Last data filed at 01/19/2018 1610 Gross per 24 hour  Intake 1440 ml  Output 1750 ml  Net -310 ml   Filed Weights   12/23/17 1627 01/01/18 1700 01/14/18 1535  Weight: 77.4 kg 78.7 kg 79.2 kg   Examination:  General exam: Alert, awake, oriented x 3 Respiratory system: Clear to auscultation. Respiratory effort normal. Cardiovascular system:normal s1,s2 sounds. No murmurs, rubs, gallops. Gastrointestinal system: Abdomen is nondistended, soft and nontender. No organomegaly or masses felt. Normal bowel sounds heard. Central nervous system: Alert and oriented. No focal neurological deficits. Extremities: No C/C/E, +pedal pulses Skin: No rashes, lesions or ulcers Psychiatry:  Appears anxious  Data Reviewed: I have personally reviewed following labs and imaging studies  CBC: Recent Labs  Lab 01/17/18 0621  WBC 6.8  HGB 9.2*  HCT 30.3*  MCV 91.5  PLT 363   Basic Metabolic Panel: Recent Labs  Lab 01/14/18 0608 01/19/18 0431  CREATININE 0.58* 0.54*   GFR: Estimated Creatinine Clearance: 139.9 mL/min (A) (by C-G formula based on SCr of 0.54 mg/dL (L)). Liver Function Tests: No results for input(s): AST, ALT, ALKPHOS, BILITOT, PROT, ALBUMIN in the last 168 hours. No results for input(s): LIPASE, AMYLASE in the last 168 hours. No results for input(s): AMMONIA in the last 168 hours. Coagulation Profile: No results for input(s): INR, PROTIME in the last 168 hours. Cardiac Enzymes: No results for input(s): CKTOTAL, CKMB, CKMBINDEX, TROPONINI in the last 168 hours. BNP (last 3 results) No results for input(s): PROBNP in the last 8760 hours. HbA1C: No results for input(s): HGBA1C in the last 72 hours. CBG: No results for input(s): GLUCAP in the last 168 hours. Lipid Profile: No results for input(s): CHOL, HDL, LDLCALC, TRIG, CHOLHDL, LDLDIRECT in the last 72 hours. Thyroid Function Tests: No results for input(s): TSH, T4TOTAL, FREET4, T3FREE, THYROIDAB in the last 72 hours. Anemia Panel: No results for input(s): VITAMINB12, FOLATE, FERRITIN, TIBC, IRON, RETICCTPCT in the last  72 hours. Sepsis Labs: No results for input(s): PROCALCITON, LATICACIDVEN in the last 168 hours.  No results found for this or any previous visit (from the past 240 hour(s)).  Radiology Studies: No results found.  Scheduled Meds: . busPIRone  15 mg Oral BID  . cyclobenzaprine  7.5 mg Oral TID  . enoxaparin (LOVENOX) injection  40 mg Subcutaneous Q24H  . feeding supplement (ENSURE ENLIVE)  237 mL Oral QID  . feeding supplement (PRO-STAT SUGAR FREE 64)  30 mL Oral TID  . multivitamin with minerals  1 tablet Oral Daily  . saccharomyces boulardii  250 mg Oral BID  . sodium  chloride flush  10-40 mL Intracatheter Q12H  . sodium chloride flush  3 mL Intravenous Q12H  . vitamin C  500 mg Oral Daily   Continuous Infusions: . sodium chloride Stopped (01/18/18 0618)  .  ceFAZolin (ANCEF) IV 2 g (01/19/18 0444)    LOS: 27 days   Time spent: 27 minutes  Standley Dakinslanford Dalexa Gentz, MD [facility] Pager [phone]  If 7PM-7AM, please contact night-coverage [email or web address] Password [email or web address] 01/19/2018, 8:51 AM

## 2018-01-19 NOTE — Consult Note (Signed)
Consulted with Dr Laural BenesJohnson and MD Lucianne MussKumar for medication recommendation related to wosening anxiety. Consider titration with current medication of Buspar and discontinue xanax and initiated  Klonopin. [facility].   Hillery Jacksanika  Lewis NP [phone]

## 2018-01-19 NOTE — BH Assessment (Signed)
This Clinical research associatewriter spoke with Dr. Laural BenesJohnson to clarify intention for TTS consult.  Fransisca KaufmannLaura Davis, NP, will contact Dr. Laural BenesJohnson with medication recommendations to help manage pt's anxiety while he is receiving inpt antibiotic tx.

## 2018-01-20 NOTE — Care Management Note (Signed)
Case Management Note  Patient Details  Name: Howard Diaz MRN: [id number] Date of Birth: 04/08/1985     If discussed at Long Length of Stay Meetings, dates discussed:  01/20/2018  Additional Comments:  Milka Windholz, Chrystine OilerSharley Diane, RN 01/20/2018, 12:03 PM

## 2018-01-20 NOTE — Progress Notes (Signed)
Physical Therapy Treatment/Discharge Summary Patient Details Name: Howard Diaz MRN: [id number] DOB: 1985/04/27 Today's Date: 01/20/2018    History of Present Illness 32 yo male with onset of sepsis after onset of osteomyelitis on lumbar spine from paraspinous abscess.  Has acute hepatic toxicity and lactic acidosis, AKI, new PICC line for ABT.  PMHx:  EtOH abuse, Hepatitis C, IV drug use,     PT Comments    Patient reports he walked 3 times in the middle of the night last night as he was unable to sleep. His pain is improving. He hopes to receive IV antibiotics for 6 weeks and end with an antibiotic injection that lasts 2 weeks to finish up his 8 week course at home. Patient understands and agrees to his DC from PT at this time to continue his activity and ambulation with nursing or family with him. Inpatient PT is discontinued at this time; goals met; all education completed. Thank you for the referral.     Follow Up Recommendations  No PT follow up     Equipment Recommendations  None recommended by PT    Recommendations for Other Services       Precautions / Restrictions Restrictions Weight Bearing Restrictions: No    Mobility  Bed Mobility                  Transfers                    Ambulation/Gait                 Stairs             Wheelchair Mobility    Modified Rankin (Stroke Patients Only)       Balance                                            Cognition Arousal/Alertness: Awake/alert Behavior During Therapy: WFL for tasks assessed/performed Overall Cognitive Status: Within Functional Limits for tasks assessed                                        Exercises      General Comments        Pertinent Vitals/Pain Faces Pain Scale: Hurts little more Pain Location: low back Pain Descriptors / Indicators: Sore;Discomfort    Home Living                      Prior Function             PT Goals (current goals can now be found in the care plan section) Acute Rehab PT Goals Patient Stated Goal: to get home PT Goal Formulation: All assessment and education complete, DC therapy Time For Goal Achievement: 01/30/18 Potential to Achieve Goals: Good Progress towards PT goals: Goals met/education completed, patient discharged from PT    Frequency    Min 3X/week      PT Plan Discharge plan needs to be updated    Co-evaluation              AM-PAC PT "6 Clicks" Mobility   Outcome Measure  Help needed turning from your back to your side while in a flat bed without using bedrails?: A Little Help needed moving from  lying on your back to sitting on the side of a flat bed without using bedrails?: A Little Help needed moving to and from a bed to a chair (including a wheelchair)?: A Little Help needed standing up from a chair using your arms (e.g., wheelchair or bedside chair)?: None Help needed to walk in hospital room?: None Help needed climbing 3-5 steps with a railing? : A Little 6 Click Score: 20    End of Session   Activity Tolerance: Patient tolerated treatment well Patient left: in bed(seated at bedside) Nurse Communication: Mobility status PT Visit Diagnosis: Unsteadiness on feet (R26.81);Muscle weakness (generalized) (M62.81);Pain;Other abnormalities of gait and mobility (R26.89)     Time: 1422-1430 PT Time Calculation (min) (ACUTE ONLY): 8 min  Charges:  $Self Care/Home Management: 8-22                     Floria Raveling. Hartnett-Rands, MS, PT Per [place] [phone] 01/20/2018

## 2018-01-20 NOTE — Progress Notes (Signed)
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  Tricuspid valve vegetation noted on echo on 12/24/2017.  Unfortunately the patient is not a candidate for home antibiotics due to his ongoing drug use. - Continue IV cefazolin started 12/25/2017 to continue thru 02/17/18.   - Blood cultures 12/23/2017 growing out MSSA - Blood cultures from 12/25/2017 no growth to date,  PICC line placed on 11/11 - ID consult in chart.    - Cardiology consulted, no plan for TEE, since vegetation seen on TTE -  Will need infectious disease follow-up after discharge prior to discontinuing antibiotics. -  Pt is being counseled daily on the importance of completing the full course of treatment for this life threatening infection.  -  Pt is High Risk for leaving AMA.  If we can get him to complete 6 weeks of IV therapy without leaving AMA, I would discuss with ID if he could go home on an oral regimen for the remaining 2 weeks.  I counseled patient at length about dangers of leaving AMA and abandoning treatment.  We cannot allow him to leave hospital without removing PICC line.    Septic pulmonary emboli: Noted on CT of the chest.  He did have hemoptysis earlier but that has mostly resolved.   Iron Deficiency Anemia: Stable at  this time.  Likely multifactorial including bone marrow suppression from sepsis, anemia of chronic disease and iron deficiency anemia based on lab work -Supplemented with [employer].   His Hg is improving.   Abdominal pain/diarrhea: Resolved.    Left shoulder myositis and septic bursitis: Noted on MRI on 12/25/2017. -Orthopedic surgery does not recommend any aspiration as there is too little fluid.   L3/4 osteomyelitis, paraspinous phlegmon, right psoas muscle abscess: All likely secondary to endocarditis.  The abscess is not significant sufficiently large for drainage at this time. -Antibiotics plan as noted above -Continue pain management.   Opioid induced Constipation - laxatives ordered as needed.  Pt reporting regular bowel movements.   IV drug use:  -Pt interested in following up with behavioral health  Chronic hepatitis C: -Outpatient treatment, plan to have patient follow up with Infectious Diseases Clinic after discharge.   Generalized Anxiety Disorder -symptoms remain uncontrolled.  Titrate buspar to 15 mg BID.  Added alprazolam prn.  Consult to TTS for management recommendations.     DVT prophylaxis: Heparin Code Status: Full Family Communication: Mother updated at bedside Disposition Plan: Continue IV Ancef through 12/31 for full 8-week course of treatment as recommended initially by ID.  PICC line has been placed.  Per CM team, pt was not accepted at any SNF facilities due to his drug abuse. Pt will have to remain in hospital until full course of treatment has been completed.    Consultants:   ID  Cardiology  Orthopedic  surgery  Procedures:  Echo 12/24/2017:- Left ventricle: The cavity size was normal. Wall thickness was normal. Systolic function was normal. The estimated ejectionfraction was in the range of 55% to 60%. Wall motion was normal; there were no regional wall motion abnormalities. - Mitral valve: There was trivial regurgitation. - Right atrium:  Central venous pressure (est): 3 mm Hg. - Atrial septum: No defect or patent foramen ovale was identified. - Tricuspid valve: Large, homogeneous echodensity associated withthe atrial side of the septal tricuspid leaflet consistent with vegetation in the setting of documented bacteremia. There wasmoderate regurgitation. - Pulmonary arteries: PA peak pressure: 28 mm Hg (S). - Pericardium, extracardiac: A moderate pericardial effusion wasidentified posterior to the heart.  Antimicrobials:   IV vancomycin started 12/23/2017 to 12/25/2017  IV aztreonam 12/23/2017 to 12/24/2017  IV cefazolin started 12/25/2017 to ongoing  Subjective: Pt says that his anxiety seems a little better controlled today.       Objective: Vitals:   01/19/18 0530 01/19/18 1405 01/19/18 2127 01/20/18 0604  BP: (!) 95/59 (!) 91/51 98/66 102/64  Pulse: 97 92 (!) 101 84  Resp: 18 18 20 18   Temp: 98.4 F (36.9 C)  98.2 F (36.8 C) 98.2 F (36.8 C)  TempSrc: Oral  Oral Oral  SpO2: 90% 100% 100% 99%  Weight:      Height:        Intake/Output Summary (Last 24 hours) at 01/20/2018 0905 Last data filed at 01/19/2018 1700 Gross per 24 hour  Intake 480 ml  Output -  Net 480 ml   Filed Weights   12/23/17 1627 01/01/18 1700 01/14/18 1535  Weight: 77.4 kg 78.7 kg 79.2 kg   Examination:  General exam: Alert, awake, oriented x 3 Respiratory system: Clear to auscultation. Respiratory effort normal. Cardiovascular system:normal s1,s2 sounds. No murmurs, rubs, gallops. Gastrointestinal system: Abdomen is nondistended, soft and nontender. No organomegaly or masses felt. Normal bowel sounds heard. Central nervous system: Alert and oriented. No focal neurological deficits. Extremities: No C/C/E, +pedal pulses Skin: No rashes, lesions or ulcers Psychiatry: Appears much less anxious.   Data Reviewed: I have personally reviewed following labs and imaging studies  CBC: Recent Labs  Lab 01/17/18 0621  WBC 6.8  HGB  9.2*  HCT 30.3*  MCV 91.5  PLT 363   Basic Metabolic Panel: Recent Labs  Lab 01/14/18 0608 01/19/18 0431  CREATININE 0.58* 0.54*   GFR: Estimated Creatinine Clearance: 139.9 mL/min (A) (by C-G formula based on SCr of 0.54 mg/dL (L)). Liver Function Tests: No results for input(s): AST, ALT, ALKPHOS, BILITOT, PROT, ALBUMIN in the last 168 hours. No results for input(s): LIPASE, AMYLASE in the last 168 hours. No results for input(s): AMMONIA in the last 168 hours. Coagulation Profile: No results for input(s): INR, PROTIME in the last 168 hours. Cardiac Enzymes: No results for input(s): CKTOTAL, CKMB, CKMBINDEX, TROPONINI in the last 168 hours. BNP (last 3 results) No results for input(s): PROBNP in the last 8760 hours. HbA1C: No results for input(s): HGBA1C in the last 72 hours. CBG: No results for input(s): GLUCAP in the last 168 hours. Lipid Profile: No results for input(s): CHOL, HDL, LDLCALC, TRIG, CHOLHDL, LDLDIRECT in the last 72 hours. Thyroid Function Tests: No results for input(s): TSH, T4TOTAL, FREET4, T3FREE, THYROIDAB in the last 72 hours. Anemia Panel: No results for input(s): VITAMINB12, FOLATE, FERRITIN, TIBC, IRON, RETICCTPCT in the last 72 hours. Sepsis Labs: No results for input(s): PROCALCITON, LATICACIDVEN in the last 168 hours.  No results  found for this or any previous visit (from the past 240 hour(s)).  Radiology Studies: No results found.  Scheduled Meds: . busPIRone  15 mg Oral TID  . cyclobenzaprine  5 mg Oral TID  . enoxaparin (LOVENOX) injection  40 mg Subcutaneous Q24H  . feeding supplement (ENSURE ENLIVE)  237 mL Oral QID  . feeding supplement (PRO-STAT SUGAR FREE 64)  30 mL Oral TID  . multivitamin with minerals  1 tablet Oral Daily  . saccharomyces boulardii  250 mg Oral BID  . sodium chloride flush  10-40 mL Intracatheter Q12H  . sodium chloride flush  3 mL Intravenous Q12H  . vitamin C  500 mg Oral Daily   Continuous Infusions: .  sodium chloride Stopped (01/18/18 0618)  .  ceFAZolin (ANCEF) IV 2 g (01/20/18 4540)    LOS: 28 days   Time spent: 24 minutes  Standley Dakins, MD [facility] Pager [phone]  If 7PM-7AM, please contact night-coverage [email or web address] Password [email or web address] 01/20/2018, 9:05 AM

## 2018-01-21 DIAGNOSIS — B182 Chronic viral hepatitis C: Secondary | ICD-10-CM

## 2018-01-21 LAB — CREATININE, SERUM
Creatinine, Ser: 0.68 mg/dL (ref 0.61–1.24)
GFR calc Af Amer: >60 mL/min (ref >60)
GFR calc non Af Amer: >60 mL/min (ref >60)

## 2018-01-21 MED ORDER — LINEZOLID 600 MG PO TABS: 600.0000 mg | ORAL_TABLET | Freq: Two times a day (BID) | ORAL | 0 refills | Status: DC

## 2018-01-21 MED ORDER — BUSPIRONE HCL 15 MG PO TABS: 15.0000 mg | ORAL_TABLET | Freq: Three times a day (TID) | ORAL | 0 refills | Status: AC

## 2018-01-21 NOTE — Progress Notes (Signed)
Patient discharged home with mother in stable condition

## 2018-01-21 NOTE — Progress Notes (Signed)
Patient stated that he wanted to leave the hospital, state he was "tired of being in the hospital".  Spoke with patient about the importance of completing treatment.  Patient stated that he still wanted to leave. Informed MD of patient desire to leave.  Patient stated that he would wait to talk with MD before he made his final decision concerning leaving before being discharged.

## 2018-01-21 NOTE — Discharge Instructions (Signed)
Seek medical care or return to ER if symptoms come back, worsen or new problems develop.   Bacteremia Bacteremia is the presence of bacteria in the blood. When bacteria enter the bloodstream, they can cause a life-threatening reaction called sepsis, which is a medical emergency. Bacteremia can spread to other parts of the body, including the heart, joints, and brain. What are the causes? This condition is caused by bacteria that get into the blood. Bacteria can enter the blood:  From a skin infection or a cut on your skin.  During an episode of pneumonia.  From an infection in your stomach or intestine (gastrointestinal infection).  From an infection in your bladder or urinary system (urinary tract infection).  During a dental or medical procedure.  After you brush your teeth so hard that your gums bleed.  When a bacterial infection in another part of the body spreads to the blood.  Through a dirty needle.  What increases the risk? This condition is more likely to develop in:  Children.  Elderly adults.  People who have a long-lasting (chronic) disease or medical condition.  People who have an artificial joint or heart valve.  People who have heart valve disease.  People who have a tube, such as a catheter or IV tube, that has been inserted for a medical treatment.  People who have a weak body defense system (immune system).  People who use IV drugs.  What are the signs or symptoms? Symptoms of this condition include:  Fever.  Chills.  A racing heart.  Shortness of breath.  Dizziness.  Weakness.  Confusion.  Nausea or vomiting.  Diarrhea.  In some cases, there are no symptoms. Bacteremia that has spread to the other parts of the body may cause symptoms in those areas. How is this diagnosed? This condition may be diagnosed with a physical exam and tests, such as:  A complete blood count (CBC). This test looks for signs of infection.  Blood cultures.  These look for bacteria in your blood.  Tests of any tubes that you may have inserted into your body, such as an IV tube or urinary catheter. These tests look for a source of infection.  Urine tests including urine cultures. These look for bacteria in the urine that could be a source of infection.  Imaging tests, such as an X-ray, CT scan, MRI, or heart ultrasound. These look for a source of infection in other parts of the body, such as the lungs, heart valves, or joints.  How is this treated? This condition may be treated with:  Antibiotic medicines given through an IV infusion. Depending on the source of infection, antibiotics may be needed for several weeks. At first, an antibiotic may be given to kill most types of blood bacteria. If your test results show that a certain kind of bacteria is causing problems, the antibiotic may be changed to kill only the bacteria that are causing problems.  Antibiotics taken by mouth.  IV fluids to support the body as you fight the infection.  Removing any catheter or device that could be a source of infection.  Blood pressure and breathing support, if you have sepsis.  Surgery to control the source or spread of infection, such as: ? Removing an infected implanted device. ? Removing infected tissue or an abscess.  This condition is usually treated at a hospital. If you are treated at home, you may need to come back for medicines, blood tests, and evaluation. This is important. Follow  these instructions at home:  Take over-the-counter and prescription medicines only as told by your health care provider.  If you were prescribed an antibiotic, take it as told by your health care provider. Do not stop taking the antibiotic even if you start to feel better.  Rest until your condition is under control.  Drink enough fluid to keep your urine clear or pale yellow.  Do not smoke. If you need help quitting, ask your health care provider.  Keep all  follow-up visits as told by your health care provider. This is important. How is this prevented?  Get the vaccinations that your health care provider recommends.  Clean and cover any scrapes or cuts.  Take good care of your skin. This includes regular bathing and moisturizing.  Wash your hands often.  Practice good oral hygiene. Brush your teeth two times a day and floss regularly. Get help right away if:  You have pain.  You have a fever.  You have trouble breathing.  Your skin becomes blotchy, pale, or clammy.  You develop confusion, dizziness, or weakness.  You develop diarrhea.  You develop any new symptoms after treatment. Summary  Bacteremia is the presence of bacteria in the blood. When bacteria enter the bloodstream, they can cause a life- threatening reaction called sepsis.  Children and elderly adults are at increased risk of bacteremia. Other risk factors include having a long-lasting (chronic) disease or a weak immune system, having an artificial joint or heart valve, having heart valve disease, having tubes that were inserted in the body for medical treatment, or using IV drugs.  Some symptoms of bacteremia include fever, chills, shortness of breath, confusion, nausea or vomiting, and diarrhea.  Tests may be done to diagnose a source of infection that led to bacteremia. These tests may include blood tests, urine tests, and imaging tests.  Bacteremia is usually treated with antibiotics, usually in a hospital. This information is not intended to replace advice given to you by your health care provider. Make sure you discuss any questions you have with your health care provider. Document Released: 11/18/2005 Document Revised: 01/02/2016 Document Reviewed: 01/02/2016 Elsevier Interactive Patient Education  2018 [employer].    Endocarditis Endocarditis is an infection of the inner layer of the heart (endocardium) or an infection of the heart valves.  Endocarditis can cause growths inside the heart or on the heart valves. Over time, these growths can destroy heart tissue and cause heart failure or problems with heart rhythm. They can also cause stroke if they break away and form a blood clot in the brain. Early treatment offers the best chance for curing endocarditis and preventing complications. What are the causes? This condition may be caused by:  Germs that normally live in or on your body. The germs that most commonly cause endocarditis are bacteria.  A fungus.  What increases the risk? This condition is more likely to develop in people who have:  A heart defect.  Artificial (prosthetic) heart valves.  An abnormal or damaged heart valve.  A history of endocarditis.  Having certain procedures may also increase the risk of germs getting into the heart or bloodstream. What are the signs or symptoms? Symptoms of this condition may start suddenly, or they may start slowly and gradually get worse. Symptoms include:  Fever.  Chills.  Night sweats.  Muscle aches.  Fatigue.  Weakness.  Shortness of breath.  Chest pain.  Blood spots in the eyes.  Bleeding under the fingernails or toenails.  Painless red spots on the palms.  Painful lumps in the fingertips or toes.  Swelling in the feet or ankles.  How is this diagnosed? This condition may be diagnosed based on:  A physical exam. Your health care provider will listen to your heart to check for abnormal heart sounds (murmur). He or she may also use a scope to check for bleeding at the back of your eyes (retinas).  Tests. They may include: ? Blood tests to look for the germs that cause endocarditis. ? Imaging tests. A chest x-ray, CT scan, or echocardiogram may be used to create an image of your heart. A type of echocardiogram called a transesophageal echocardiogram may be done to look at certain heart valves more closely.  How is this treated? Treatment for  this condition depends on the cause of the endocarditis. Treatment may include:  Antibiotic medicines. These may be given through a tube into one of your veins (IV antibiotics) or taken by mouth. You may need to be on more than one antibiotic medicine.  Surgery to replace your heart valve. You may need surgery if: ? The endocarditis does not respond to treatment. ? You develop complications. ? Your heart valve is severely damaged.  Follow these instructions at home: Medicines  Take over-the-counter and prescription medicines only as told by your health care provider.  If you were prescribed an antibiotic medicine, take it as told by your health care provider. Do not stop taking the antibiotic even if you start to feel better. You may need to be on intravenous antibiotics for several weeks.  Do not use IV drugs unless it is part of your medical treatment. Lifestyle  Do not get tattoos or body piercings.  Practice good oral hygiene. This includes: ? Brushing and flossing regularly. ? Scheduling routine dental appointments.  Do not use any products that contain nicotine or tobacco, such as cigarettes and e-cigarettes. If you need help quitting, ask your health care provider.  Limit alcohol intake to no more than 1 drink a day for nonpregnant women and 2 drinks a day for men. One drink equals 12 oz of beer, 5 oz of wine, or 1 oz of hard liquor. General instructions  Let your health care provider know before you have any dental or surgical procedures. You may need to take antibiotics before the procedure.  Tell all of your health care providers, including your dentist, that you have had endocarditis.  Gradually resume your usual activities.  Keep all follow-up visits as told by your health care provider. This is important. Contact a health care provider if:  You have a fever.  Your symptoms do not improve.  Your symptoms get worse.  Your symptoms come back. Get help right  away if:  You have trouble breathing.  You have chest pain.  You have symptoms of a stroke. These include: ? Sudden weakness. ? Numbness. ? Confusion. ? Trouble talking or understanding. ? A severe headache. Summary  Endocarditis is an infection of the inner layer of the heart (endocardium) or heart valves. It is caused by bacteria or a fungus.  Having certain heart conditions or procedures may increase the risk of endocarditis.  Antibiotics are an important treatment for endocarditis. Take these medicines as told by your health care provider. Do not stop taking them even if you start to feel better.  Tell all of your health care providers, including your dentist, that you have had endocarditis. This information is not intended to replace advice  given to you by your health care provider. Make sure you discuss any questions you have with your health care provider. Document Released: 02/04/2005 Document Revised: 11/17/2015 Document Reviewed: 11/17/2015 Elsevier Interactive Patient Education  2018 [employer]. Diskitis Diskitis is irritation and swelling (inflammation) of the disks in the spine. Disks are soft structures that cushion the bones of the spine. This is not a common condition. Diskitis most often affects the disks of the lower back (lumbar disks) or upper back (thoracic disks). What are the causes? A bacterial or viral infection can lead to diskitis. When diskitis develops, it is often accompanied by infection-induced inflammation of the bones (osteomyelitis) surrounding the spine. The condition can also be caused by inflammation from another condition, like an autoimmune disease. If you have an autoimmune disease, your body's defense system (immune system) mistakenly attacks your own healthy cells instead of germs and other things that can make you sick. What increases the risk? People at higher risk of developing diskitis include:  Children.  Older persons.  People  with weak immune systems or immune system disorders.  People with diabetes.  People having chemotherapy.  What are the signs or symptoms? Back or stomach pain is the most common symptom of diskitis. Walking, standing, and sitting may be painful. Other symptoms may include:  Trouble standing or rising from a sitting position.  Fever lower than 102F (38.9C).  Back stiffness.  Flu-like symptoms, such as a sore throat and a runny nose.  Irritability.  Feeling pain when the affected area is touched.  How is this diagnosed? Your health care provider can diagnose diskitis based on symptoms and medical history. Your health care provider will also do a physical exam. You may also need to have blood tests or imaging studies, such as:  X-ray of the spine.  MRI of the spine.  A bone scan.  How is this treated? Treatment for diskitis may include:  Bed rest.  Medicines, such as: ? Antibiotics to treat a possible bacterial infection. ? Anti-inflammatory medicines. ? Steroids if the condition does not improve over time. ? Pain-relieving medicines.  A brace to stop your back from moving.  Follow these instructions at home:  If you were prescribed an antibiotic medicine, finish it all even if you start to feel better.  Take medicines only as directed by your health care provider.  Keep all follow-up visits as directed by your health care provider. This is important. Contact a health care provider if:  You have difficulty walking or standing.  You have persistent back pain.  You are having side effects from medicines. This information is not intended to replace advice given to you by your health care provider. Make sure you discuss any questions you have with your health care provider. Document Released: 01/06/2004 Document Revised: 07/13/2015 Document Reviewed: 06/09/2013 Elsevier Interactive Patient Education  2018 [employer]. Bone and Joint Infections, Adult Bone  infections (osteomyelitis) and joint infections (septic arthritis) occur when bacteria or other germs get inside a bone or a joint. This can happen if you have an infection in another part of your body that spreads through your blood. Germs from your skin or from outside of your body can also cause this type of infection if you have a wound or a broken bone (fracture) that breaks the skin. Anyone can get a bone infection or joint infection. You may be more likely to get this type of infection if you have a condition, such as diabetes, that lowers  your ability to fight infection or increases your chances of getting an infection. Bone and joint infections can cause damage, and they can spread to other areas of your body. They need to be treated quickly. What are the causes? Most bone and joint infections are caused by bacteria. They can also be caused by other germs, such as viruses and funguses. What increases the risk? This condition is more likely to develop in:  People who recently had surgery, especially bone or joint surgery.  People who have a long-term (chronic) disease, such as: ? HIV (human immunodeficiency virus). ? Diabetes. ? Rheumatoid arthritis. ? Sickle cell anemia.  Elderly people.  People who take medicines that block or weaken the bodys defense system (immune system).  People who have a condition that reduces their blood flow.  People who are on kidney dialysis.  People who have an artificial joint.  People who have had a joint or bone repaired with plates or screws (surgical hardware).  People who use or abuse IV drugs.  People who have had trauma, such as stepping on a nail.  What are the signs or symptoms? Symptoms vary depending on the type and location of your infection. Common symptoms of bone and joint infections include:  Fever and chills.  Redness and warmth.  Swelling.  Pain and stiffness.  Drainage of fluid or pus near the infection.  Weight  loss and fatigue.  Decreased ability to use a hand or foot.  How is this diagnosed? This condition may be diagnosed based on symptoms, medical history, a physical exam, and diagnostic tests. Tests can help to identify the cause of the infection. You may have various tests, such as:  A sample of tissue, fluid, or blood taken to be examined under a microscope.  A procedure to remove fluid from the infected joint with a needle (joint aspiration) for testing in a lab.  Pus or discharge swabbed from a wound for testing to identify germs and to determine what type of medicine will kill them (culture and sensitivity).  Blood tests to look for evidence of infection and inflammation (biomarkers).  Imaging studies to determine how severe the bone or joint infection is. These may include: ? X-rays. ? CT scan. ? MRI. ? Bone scan.  How is this treated? Treatment depends on the cause and type of infection. Antibiotic medicines are usually the first treatment for a bone or joint infection. Treatment with antibiotics may include:  Getting IV antibiotics. This may be done in a hospital at first. You may have to continue IV antibiotics at home for several weeks. You may also have to take antibiotics by mouth for several weeks after that.  Taking more than one kind of antibiotic. Treatment may start with a type of antibiotic that works against many different bacteria (broad spectrumantibiotics). IV antibiotics may be changed if tests show that another type may work better.  Other treatments may include:  Draining fluid from the joint by placing a needle into it (aspiration).  Surgery to remove: ? Dead or dying tissue from a bone or joint. ? An infected artificial joint. ? Infected plates or screws that were used to repair a broken bone.  Follow these instructions at home:  Take medicines only as directed by your health care provider.  Take your antibiotic medicine as directed by your health  care provider. Finish the antibiotic even if you start to feel better.  Follow instructions from your health care provider about how to take  IV antibiotics at home.  Ask your health care provider if you have any restrictions on your activities.  Keep all follow-up visits as directed by your health care provider. This is important. Contact a health care provider if:  You have a fever or chills.  You have redness, warmth, pain, or swelling that returns after treatment. Get help right away if:  You have rapid breathing or you have trouble breathing.  You have chest pain.  You cannot drink fluids or make urine.  The affected arm or leg swells, changes color, or turns blue. This information is not intended to replace advice given to you by your health care provider. Make sure you discuss any questions you have with your health care provider. Document Released: 02/04/2005 Document Revised: 07/13/2015 Document Reviewed: 02/02/2014 Elsevier Interactive Patient Education  2018 [employer].  Staphylococcal Infection Staphylococcus aureus, also known as staph, is a bacteria that can cause infections. The most common type of staph infection is a skin infection. Staph can spread easily from one person to another. How is this diagnosed? Your health care provider may diagnose and treat a staph infection based on an exam. He or she may also do a culture from the affected area:  To find out exactly what type of bacteria is causing your infection.  To decide which medicine is best to treat it.  How is this treated? Most staph infections can be easily treated with antibiotic medicine. Some infections must be drained. More serious types of staph, including methicillin-resistant Staphylococcus aureus (MRSA) may be more difficult to treat. They may require a strong antibiotic or more than one antibiotic. Follow these instructions at home:  Take your antibiotic as directed by your health care  provider. Finish your antibiotic even if you start to feel better.  Keep all follow-up visits as directed by your health care provider.  Tell all of your health care providers including dentists that you have a staph infection, especially if you have been diagnosed with MRSA.  Ask your health care provider when it is safe for you to return to school or work.  To prevent spreading the infection: ? Keep infections and pus or drainage material covered with clean, dry bandages (dressings), or as directed by your health care provider. ? Wash your hands with soap and water often, especially after changing dressings or touching your infection. ? Advise your family and other close contacts to wash their hands frequently with soap and water. They should always wash their hands after changing your dressings, touching the infected area, or touching infected materials. ? Avoid sharing personal items that may have had contact with your infection. These include towels, washcloths, razors, clothing, and uniforms. ? Wash your linens and clothes with hot water and laundry detergent. Drying clothes in a hot dryer--rather than air-drying--also helps to kill bacteria in clothes. Contact a health care provider if: Although they are usually easy to treat, some staph infections can become very serious. You should contact your health care provider if your infection gets worse or if you have a fever. This information is not intended to replace advice given to you by your health care provider. Make sure you discuss any questions you have with your health care provider. Document Released: 02/25/2014 Document Revised: 07/13/2015 Document Reviewed: 11/09/2013 Elsevier Interactive Patient Education  [employer].

## 2018-01-21 NOTE — Care Management Note (Signed)
Case Management Note  Patient Details  Name: Howard Diaz MRN: [id number] Date of Birth: 10/28/1985  Subjective/Objective:    Bacteremia. Leaving AMA.                 Action/Plan: Patient will DC on [facility]. This is on the medicaid preferred list. Have called and left message with ID to schedule follow up appt for patient.  Have given a list of PCP's for patient to establish care.   Expected Discharge Date:  01/21/18               Expected Discharge Plan:  Home/Self Care  In-House Referral:     Discharge planning Services  CM Consult, Medication Assistance, Follow-up appt scheduled  Post Acute Care Choice:    Choice offered to:     DME Arranged:    DME Agency:     HH Arranged:    HH Agency:     Status of Service:  Completed, signed off  If discussed at [employer] Length of Stay Meetings, dates discussed:    Additional Comments:  Natajah Derderian, Chrystine OilerSharley Diane, RN 01/21/2018, 10:36 AM

## 2018-01-21 NOTE — Progress Notes (Addendum)
01/21/2018 10:01 AM  Called to see patient. He says that he is leaving today and will not stay in hospital for more treatment.  I have tried to convince him to stay and complete full course of treatment but he refuses.  He says his ride is coming to pick him up at 1:30p.  I explained the risks, including death of not fully treating this infection and he verbalized understanding.  I called and spoke with ID specialist Dr. Luciana Axeomer who said that our best option now is to have him take 30 days of zyvox.  I spoke with care manager and patient has [place] and the medication is covered.  Will DC PICC line and DC home today.  He is discharging against medical advice.  Pt says that he will follow up with [facility] in 1 week to be rechecked.  He will work to establish a PCP now that he has [place] Medicaid coverage.   Maryln Manuel. Johnson MD

## 2018-01-21 NOTE — Discharge Summary (Signed)
Physician Discharge Summary  Howard Diaz ZOX:[id number] DOB: 08-03-1985 DOA: 12/23/2017  Admit date: 12/23/2017 Discharge date: 01/21/2018  PATIENT DISCHARGING AGAINST MEDICAL ADVICE  Admitted From: Home  Disposition: Home   Recommendations for Outpatient Follow-up:  1. Follow up with [facility] in 1-2 weeks for recheck of MSSA bacteremia and chronic hepatitis C.  2. Please obtain BMP/CBC in 1-2 weeks to follow zyvox treatment   Discharge Condition: GUARDED CODE STATUS: FULL    Brief Hospitalization Summary: Please see all hospital notes, images, labs for full details of the hospitalization.  HPI: Howard Diaz is a 32 y.o. male with a past medical history significant for alcohol use, history of hepatitis C, acute hepatic toxicity felt to be a combination of hepatitis C and alcohol abuse (he required hospitalization at that time and experienced jaundice), also with History of tobacco abuse and IV drug use; who presented to the emergency department secondary to lower back pain, weakness, fever and general malaise.  Patient reports back pain has been present for the last week or so and worsening.  Pain is localized in the lower aspect of his back exacerbated by any kind of movement, reports sudden intermittent radiation across his back and reported some numbness feeling. Patient has had anorexia and reported some loose stools.  Patient denies any chest pain, cough, dysuria, hematuria, melena, hematochezia, abdominal pain, nausea, vomiting or any other complaints.  He was seen in the emergency department (outside facility), where he was diagnosed with fine hair fracture after having a CT scan of his lower protuberance.  Patient was discharged home on Flexeril and pain medication.  He reported no improvement in symptoms and given the initiation of fever decide to seek further medical care.  In the ED work-up demonstrated patient meeting sepsis criteria with elevated temperature,  tachycardia, elevated WBCs and images studies suggesting acute lumbar osteomyelitis.  Patient received fluid resuscitation as per sepsis protocol, cultures taken and patient started on IV antibiotics.  [facility] has been contacted to admit patient for further management.  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 Tricuspid valve vegetation noted on echo on 12/24/2017. Unfortunately the patient is not a candidate for home antibiotics due to his ongoing drug use. - Continue IV cefazolin started 12/25/2017 to continue thru 02/17/18.   - Blood cultures 12/23/2017 growing out MSSA - Blood cultures from 12/25/2017 no growth to date, PICC line placed on 11/11 - ID consult in chart.    - Cardiology consulted, no plan for TEE, since vegetation seen on TTE -  Will need infectious disease follow-up after discharge prior to discontinuing antibiotics. -  Pt is being counseled daily on the importance of completing the full course of treatment for this life threatening infection.  -  Pt is leaving AMA.  I have counseled him for over 1 hour today and I have discussed the risks of not treating the infection fully up to and  including death and he verbalized understanding.  I called and spoke with Dr. Luciana Axe and the best therapy we can offer him now is to have him take zyvox for 30 days.  I discussed with care management and he has [place]  medicaid and zyvox is covered.  I will prescribe 30 day course of zyvox.  Pt says that he will follow up with the [facility] in 1-2 weeks for recheck and for lab testing.  Care management is working on making him an appointment.   We cannot allow him to leave hospital without removing PICC line.  PICC line will be removed prior to discharge from hospital.  I explained to him the importance of follow up and the importance of lab monitoring while on zyvox and he verbalized understanding.    Septic pulmonary emboli: Noted on CT of the chest.He did have hemoptysis earlier but that has mostly resolved.   Iron Deficiency Anemia: Stable at this time. Likely multifactorial including bone marrowsuppression from sepsis, anemia of chronic disease and iron deficiency anemia based on lab work -Supplemented with [employer].   His Hg is improving.   Abdominal pain/diarrhea: Resolved.    Left shoulder myositis and septic bursitis: Noted on MRI on 12/25/2017. -Orthopedic surgery does not recommend any aspiration as there is too little fluid.   L3/4 osteomyelitis, paraspinous phlegmon, right psoas muscle abscess: All likely secondary to endocarditis. The abscess is not significant sufficiently large for drainage at this time. -Antibiotics plan as noted above -Continuepain management.   Opioid induced Constipation - laxatives ordered as needed.  Pt reporting regular bowel movements.   IV drug use:  -Pt interested in following up with behavioral health.  Pt is working on establishing care with a PCP now that he has medicaid benefits and will work towards arranging outpatient behavioral health services.  Pt to resume home suboxone at discharge.    Chronic hepatitis C: -Outpatient treatment, plan to have patient follow up with Infectious Diseases Clinic after discharge.   Generalized Anxiety Disorder -symptoms remain uncontrolled.  PT was treated with buspar to 15 mg BID.   Consulted to TTS for  management recommendations.     DVT prophylaxis: Heparin Code Status: Full Family Communication: Mother updated at bedside Disposition Plan: Continue IV Ancef through 12/31 for full 8-week course of treatment as recommended initially by ID.  PICC line has been placed.  Per CM team, pt was not accepted at any SNF facilities due to his drug abuse. Pt will have to remain in hospital until full course of treatment has been completed.    Consultants:   ID  Cardiology  Orthopedic surgery  Procedures:  Echo 12/24/2017:- Left ventricle: The cavity size was normal. Wall thickness was normal. Systolic function was normal. The estimated ejectionfraction was in the range of 55% to 60%. Wall motion was normal; there were no regional wall motion abnormalities. - Mitral valve: There was trivial regurgitation. - Right atrium: Central venous pressure (est): 3 mm Hg. - Atrial septum: No defect or patent foramen ovale was identified. - Tricuspid valve: Large, homogeneous echodensity associated withthe atrial side of the septal tricuspid leaflet consistent withvegetation in the setting of documented bacteremia. There wasmoderate regurgitation. - Pulmonary arteries: PA peak pressure: 28 mm Hg (S). - Pericardium, extracardiac: A moderate pericardial effusion wasidentified posterior to the heart.  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  He is due to complete 8 weeks of intravenous antibiotics on 12/31   Call MD for:  difficulty breathing, headache or visual disturbances   Complete by:  As directed    Call MD for:  extreme fatigue   Complete by:  As directed    Call MD for:  persistant dizziness or light-headedness   Complete by:  As directed    Call MD for:  persistant nausea and vomiting   Complete by:  As directed    Call MD for:  severe uncontrolled pain   Complete by:  As directed    Increase activity slowly   Complete by:  As directed      Allergies as of 01/21/2018      Reactions   Penicillins Anaphylaxis   Has patient had a PCN reaction causing immediate rash, facial/tongue/throat swelling, SOB or lightheadedness with hypotension: Yes Has patient had a PCN reaction causing severe rash involving mucus membranes or skin necrosis: Yes Has patient had a PCN reaction that required hospitalization:Yes Has patient had a PCN reaction occurring within the last 10 years: No If all of the above answers are "NO", then may proceed with Cephalosporin use.      Medication List    TAKE these medications   buprenorphine-naloxone 8-2 mg Subl SL tablet Commonly known as:  SUBOXONE Place 1 tablet under the tongue 3 (three) times daily.   busPIRone 15 MG tablet Commonly known as:  BUSPAR Take 1 tablet (15 mg total) by mouth 3 (three) times daily.   cyclobenzaprine 10 MG tablet Commonly known as:  FLEXERIL Take 10 mg by mouth every 8 (eight) hours.   lidocaine 5 % Commonly known as:  LIDODERM Place 1 patch onto the skin daily. Remove & Discard patch within 12 hours or as directed by MD   linezolid 600 MG tablet Commonly known as:  ZYVOX Take 1 tablet (600 mg total) by mouth 2 (two) times daily.   naproxen 500 MG tablet Commonly known as:  NAPROSYN Take 500 mg by mouth 2 (two) times daily.      Follow-up Information    Comer, Belia Heman, MD. Schedule an appointment as  soon as possible for a visit in 1 week(s).   Specialty:  Infectious Diseases Why:  Hospital Follow Up for MSSA Bacteremia Contact information: [address] [place] [phone]          Allergies  Allergen Reactions  . Penicillins Anaphylaxis    Has patient had a PCN reaction causing immediate rash, facial/tongue/throat swelling, SOB or lightheadedness with hypotension: Yes Has patient had a PCN reaction causing severe rash involving mucus membranes or skin necrosis: Yes Has patient had a PCN reaction that required hospitalization:Yes Has patient had a PCN reaction occurring within the last 10 years: No If all of the above answers are "NO", then may proceed with Cephalosporin use.    Allergies as of 01/21/2018      Reactions   Penicillins Anaphylaxis   Has patient had a PCN reaction causing  immediate rash, facial/tongue/throat swelling, SOB or lightheadedness with hypotension: Yes Has patient had a PCN reaction causing severe rash involving mucus membranes or skin necrosis: Yes Has patient had a PCN reaction that required hospitalization:Yes Has patient had a PCN reaction occurring within the last 10 years: No If all of the above answers are "NO", then may proceed with Cephalosporin use.      Medication List    TAKE these medications   buprenorphine-naloxone 8-2 mg Subl SL tablet Commonly known as:  SUBOXONE Place 1 tablet under the tongue 3 (three) times daily.   busPIRone 15 MG tablet Commonly known as:  BUSPAR Take 1 tablet (15 mg total) by mouth 3 (three) times daily.   cyclobenzaprine 10 MG tablet Commonly known as:  FLEXERIL Take 10 mg by mouth every 8 (eight) hours.   lidocaine 5 % Commonly known as:  LIDODERM Place 1 patch onto the skin daily. Remove & Discard patch within 12 hours or as directed by MD   linezolid 600 MG tablet Commonly known as:  ZYVOX Take 1 tablet (600 mg total) by mouth 2 (two) times daily.   naproxen 500 MG  tablet Commonly known as:  NAPROSYN Take 500 mg by mouth 2 (two) times daily.       Procedures/Studies: Dg Chest 2 View  Result Date: 12/23/2017 CLINICAL DATA:  Fever and body aches. EXAM: CHEST - 2 VIEW COMPARISON:  None. FINDINGS: The heart is normal in size given the AP projection. The mediastinal and hilar contours appear normal. Streaky areas of subsegmental atelectasis are noted. No definite infiltrates or effusions. The bony thorax is intact. IMPRESSION: Low lung volumes with streaky areas of subsegmental atelectasis but no definite infiltrates or effusions. Electronically Signed   By: Rudie Meyer M.D.   On: 12/23/2017 10:35   Ct Chest W Contrast  Result Date: 12/26/2017 CLINICAL DATA:  Peritonitis.  L3-4 osteomyelitis. EXAM: CT CHEST, ABDOMEN, AND PELVIS WITH CONTRAST TECHNIQUE: Multidetector CT imaging of the chest, abdomen and pelvis was performed following the standard protocol during bolus administration of intravenous contrast. CONTRAST:  ISOVUE-300 IOPAMIDOL (ISOVUE-300) INJECTION 61% COMPARISON:  MRI of the spine 12/23/2017 FINDINGS: CT CHEST FINDINGS Cardiovascular: No significant vascular findings. Normal heart size. Minimal pericardial effusion. Mediastinum/Nodes: 13 mm in short axis right subcarinal lymph node. Other smaller bilateral likely reactive mediastinal and hilar lymph nodes. Thyroid gland, trachea, and esophagus demonstrate no significant findings. Lungs/Pleura: Numerous peripheral some cavitating soft tissue masses, likely representing septic emboli. Bilateral small pleural effusions. Bilateral lower lobe atelectasis. Musculoskeletal: No chest wall mass or suspicious bone lesions identified. CT ABDOMEN PELVIS FINDINGS Hepatobiliary: No focal liver abnormality is seen. No gallstones, gallbladder wall thickening, or biliary dilatation. Pancreas: Unremarkable. No pancreatic ductal dilatation or surrounding inflammatory changes. Spleen: Mild splenomegaly with the  length of the spleen measuring 13 cm. Adrenals/Urinary Tract: Adrenal glands are unremarkable. Mild perinephric stranding. Kidneys are without renal calculi, focal lesion, or hydronephrosis. Bladder is unremarkable. Stomach/Bowel: Stomach is within normal limits. Appendix appears normal. No evidence of bowel wall thickening, distention, or inflammatory changes. Fluid contents of the colon consistent with a diarrheal state. Vascular/Lymphatic: No significant vascular findings are present. Predominantly left-sided retroperitoneal lymphadenopathy with lymph nodes measuring up to 16 mm in short axis. Enlarged lymph node within the porta hepatis. Reproductive: Prostate is unremarkable. Other: Small amount of free fluid in the abdomen and pelvis. No peritoneal thickening or enhancement. Musculoskeletal: Previously demonstrated right psoas muscle abscess measures 1.9 by 1.2 cm. Known L3-4  discitis/osteomyelitis presents as narrowing of the disc space and lytic and sclerotic changes of the endplates. Associated ventral focal epidural thickening, as seen on prior MRI, causes focal narrowing of the spinal canal. Mild paraspinous edema. IMPRESSION: Innumerable bilateral pulmonary septic emboli. Mild pericardial thickening. Small volume abdominopelvic ascites. No appreciable peritoneal thickening or enhancement. Mild splenomegaly. Retroperitoneal lymphadenopathy, likely reactive. Known right psoas muscle abscess measures 1.9 cm in greatest dimension. Known L3-4 discitis/osteomyelitis with associated focal ventral epidural thickening causing narrowing of the spinal canal, and mild paraspinous edema. Diarrheal state. Electronically Signed   By: Ted Mcalpine M.D.   On: 12/26/2017 12:08   Mr Thoracic Spine W Wo Contrast  Result Date: 12/23/2017 CLINICAL DATA:  Initial evaluation for progressive back pain, fever, history of IV drug abuse. EXAM: MRI THORACIC AND LUMBAR SPINE WITHOUT AND WITH CONTRAST TECHNIQUE: Multiplanar  and multiecho pulse sequences of the thoracic and lumbar spine were obtained without and with intravenous contrast. CONTRAST:  7 cc Gadavist. COMPARISON:  None available. FINDINGS: MRI THORACIC SPINE FINDINGS Alignment: Examination technically limited by you technique in large field-of-view on sagittal sequences. Mild scoliosis. Alignment otherwise normal with preservation of the normal thoracic kyphosis. No listhesis or malalignment. Vertebrae: Vertebral body heights maintained without evidence for acute or chronic fracture. Bone marrow signal intensity diffusely decreased on T1 weighted imaging, most commonly related to anemia, smoking, or obesity. Few small benign hemangiomas noted within the lower thoracic spine. No worrisome osseous lesions. No evidence for osteomyelitis discitis or septic arthritis within the thoracic spine. Cord: Signal intensity within the thoracic spinal cord is normal. No epidural collections. Paraspinal and other soft tissues: Paraspinous soft tissues demonstrate no acute finding. Atelectasis versus consolidation noted within the posterior left lung base. Visualized lungs are otherwise grossly clear. Visualized visceral structures grossly unremarkable. Disc levels: T1-2:  Unremarkable. T2-3: Unremarkable. T3-4:  Unremarkable. T4-5: Shallow left paracentral disc protrusion mildly indents the left ventral thecal sac. Minimal flattening of the left ventral cord without significant stenosis. T5-6:  Unremarkable. T6-7: Right paracentral disc protrusion with slight superior migration. Protruding disc contacts the right ventral cord with secondary mild cord flattening. No significant stenosis. T7-8: Shallow right paracentral disc protrusion abuts the right ventral cord. No significant stenosis or cord deformity. T8-9: Shallow left paracentral disc protrusion mildly indents the left ventral thecal sac. Minimal flattening of the left ventral cord without significant stenosis. T9-10: Unremarkable.  T10-11: Minimal disc bulge. Mild posterior element hypertrophy. No significant stenosis. T11-12: Mild posterior element hypertrophy. No significant stenosis. T12-L1:  Unremarkable. MRI LUMBAR SPINE FINDINGS Segmentation: Normal segmentation. Lowest well-formed disc labeled the L5-S1 level. Alignment: Straightening of the normal lumbar lordosis with trace dextroscoliosis. No listhesis or malalignment. Vertebrae: Abnormal edema and enhancement seen within the L3 and L4 vertebral bodies centered about the L3-4 disc space. Abnormal edema with loss of normal disc height seen within the L3-4 disc itself. Findings consistent with acute osteomyelitis discitis. Mild diffuse enhancement seen within the ventral epidural space, extending into the bilateral L3-4 neural foramina without frank epidural abscess or collection. Associated paraspinous edema and enhancement within the adjacent psoas musculature bilaterally. Superimposed paraspinous abscess within the right psoas muscle measures 1.3 x 1.0 x 2.7 cm (AP by transverse by craniocaudad, series 7, image 25). No other discrete soft tissue collections. Mild edema and enhancement noted about the bilateral L3-4 facets as well. No other evidence for acute infection within the lumbar spine. Vertebral body height maintained at this time without evidence for acute or chronic fracture.  Underlying bone marrow signal intensity diffusely decreased on T1 weighted imaging. Small benign hemangioma noted within the T12 vertebral body. No worrisome osseous lesions. Conus medullaris: Extends to the L1 level and appears normal. Paraspinal and other soft tissues: Paraspinous edema and enhancement adjacent to the L3-4 interspace with superimposed right psoas muscle abscess as above. Soft tissue edema within the posterior paraspinous musculature extending from L3-4 inferiorly could be reactive in nature or reflect associated myositis (series 2, images 6, 12). No discrete collections within this  region. Retroperitoneal lymph nodes measure up to 16 mm in short axis (series 4, image 15), indeterminate, but could be reactive. Visualized visceral structures grossly unremarkable. Disc levels: L1-2:  Unremarkable. L2-3:  Unremarkable. L3-4: Findings consistent with acute osteomyelitis discitis. Associated loss of L3-4 disc height with diffuse disc bulging. Enhancement within the ventral epidural space extending into the bilateral L3-4 neural foramina without frank epidural abscess. Resultant mild spinal stenosis. Mild to moderate bilateral L3 neural foraminal narrowing. L4-5:  Unremarkable. L5-S1:  Unremarkable. IMPRESSION: 1. Findings consistent with acute osteomyelitis discitis involving the L3-4 level. Associated enhancement involving the adjacent ventral epidural space without discrete epidural abscess or collection. Mild spinal stenosis at this level. 2. Paraspinous phlegmon and edema adjacent to the L3-4 level with superimposed 1.3 x 1.0 x 2.7 cm right psoas muscle abscess as above. 3. No other evidence for acute infection within the thoracolumbar spine. 4. Multifocal small disc protrusions involving the mid-thoracic spine as above without significant stenosis. Electronically Signed   By: Rise Mu M.D.   On: 12/23/2017 13:47   Mr Lumbar Spine W Wo Contrast  Result Date: 12/23/2017 CLINICAL DATA:  Initial evaluation for progressive back pain, fever, history of IV drug abuse. EXAM: MRI THORACIC AND LUMBAR SPINE WITHOUT AND WITH CONTRAST TECHNIQUE: Multiplanar and multiecho pulse sequences of the thoracic and lumbar spine were obtained without and with intravenous contrast. CONTRAST:  7 cc Gadavist. COMPARISON:  None available. FINDINGS: MRI THORACIC SPINE FINDINGS Alignment: Examination technically limited by you technique in large field-of-view on sagittal sequences. Mild scoliosis. Alignment otherwise normal with preservation of the normal thoracic kyphosis. No listhesis or malalignment.  Vertebrae: Vertebral body heights maintained without evidence for acute or chronic fracture. Bone marrow signal intensity diffusely decreased on T1 weighted imaging, most commonly related to anemia, smoking, or obesity. Few small benign hemangiomas noted within the lower thoracic spine. No worrisome osseous lesions. No evidence for osteomyelitis discitis or septic arthritis within the thoracic spine. Cord: Signal intensity within the thoracic spinal cord is normal. No epidural collections. Paraspinal and other soft tissues: Paraspinous soft tissues demonstrate no acute finding. Atelectasis versus consolidation noted within the posterior left lung base. Visualized lungs are otherwise grossly clear. Visualized visceral structures grossly unremarkable. Disc levels: T1-2:  Unremarkable. T2-3: Unremarkable. T3-4:  Unremarkable. T4-5: Shallow left paracentral disc protrusion mildly indents the left ventral thecal sac. Minimal flattening of the left ventral cord without significant stenosis. T5-6:  Unremarkable. T6-7: Right paracentral disc protrusion with slight superior migration. Protruding disc contacts the right ventral cord with secondary mild cord flattening. No significant stenosis. T7-8: Shallow right paracentral disc protrusion abuts the right ventral cord. No significant stenosis or cord deformity. T8-9: Shallow left paracentral disc protrusion mildly indents the left ventral thecal sac. Minimal flattening of the left ventral cord without significant stenosis. T9-10: Unremarkable. T10-11: Minimal disc bulge. Mild posterior element hypertrophy. No significant stenosis. T11-12: Mild posterior element hypertrophy. No significant stenosis. T12-L1:  Unremarkable. MRI LUMBAR SPINE FINDINGS Segmentation:  Normal segmentation. Lowest well-formed disc labeled the L5-S1 level. Alignment: Straightening of the normal lumbar lordosis with trace dextroscoliosis. No listhesis or malalignment. Vertebrae: Abnormal edema and  enhancement seen within the L3 and L4 vertebral bodies centered about the L3-4 disc space. Abnormal edema with loss of normal disc height seen within the L3-4 disc itself. Findings consistent with acute osteomyelitis discitis. Mild diffuse enhancement seen within the ventral epidural space, extending into the bilateral L3-4 neural foramina without frank epidural abscess or collection. Associated paraspinous edema and enhancement within the adjacent psoas musculature bilaterally. Superimposed paraspinous abscess within the right psoas muscle measures 1.3 x 1.0 x 2.7 cm (AP by transverse by craniocaudad, series 7, image 25). No other discrete soft tissue collections. Mild edema and enhancement noted about the bilateral L3-4 facets as well. No other evidence for acute infection within the lumbar spine. Vertebral body height maintained at this time without evidence for acute or chronic fracture. Underlying bone marrow signal intensity diffusely decreased on T1 weighted imaging. Small benign hemangioma noted within the T12 vertebral body. No worrisome osseous lesions. Conus medullaris: Extends to the L1 level and appears normal. Paraspinal and other soft tissues: Paraspinous edema and enhancement adjacent to the L3-4 interspace with superimposed right psoas muscle abscess as above. Soft tissue edema within the posterior paraspinous musculature extending from L3-4 inferiorly could be reactive in nature or reflect associated myositis (series 2, images 6, 12). No discrete collections within this region. Retroperitoneal lymph nodes measure up to 16 mm in short axis (series 4, image 15), indeterminate, but could be reactive. Visualized visceral structures grossly unremarkable. Disc levels: L1-2:  Unremarkable. L2-3:  Unremarkable. L3-4: Findings consistent with acute osteomyelitis discitis. Associated loss of L3-4 disc height with diffuse disc bulging. Enhancement within the ventral epidural space extending into the bilateral  L3-4 neural foramina without frank epidural abscess. Resultant mild spinal stenosis. Mild to moderate bilateral L3 neural foraminal narrowing. L4-5:  Unremarkable. L5-S1:  Unremarkable. IMPRESSION: 1. Findings consistent with acute osteomyelitis discitis involving the L3-4 level. Associated enhancement involving the adjacent ventral epidural space without discrete epidural abscess or collection. Mild spinal stenosis at this level. 2. Paraspinous phlegmon and edema adjacent to the L3-4 level with superimposed 1.3 x 1.0 x 2.7 cm right psoas muscle abscess as above. 3. No other evidence for acute infection within the thoracolumbar spine. 4. Multifocal small disc protrusions involving the mid-thoracic spine as above without significant stenosis. Electronically Signed   By: Rise MuBenjamin  McClintock M.D.   On: 12/23/2017 13:47   Ct Abdomen Pelvis W Contrast  Result Date: 12/26/2017 CLINICAL DATA:  Peritonitis.  L3-4 osteomyelitis. EXAM: CT CHEST, ABDOMEN, AND PELVIS WITH CONTRAST TECHNIQUE: Multidetector CT imaging of the chest, abdomen and pelvis was performed following the standard protocol during bolus administration of intravenous contrast. CONTRAST:  100mL ISOVUE-300 IOPAMIDOL (ISOVUE-300) INJECTION 61% COMPARISON:  MRI of the spine 12/23/2017 FINDINGS: CT CHEST FINDINGS Cardiovascular: No significant vascular findings. Normal heart size. Minimal pericardial effusion. Mediastinum/Nodes: 13 mm in short axis right subcarinal lymph node. Other smaller bilateral likely reactive mediastinal and hilar lymph nodes. Thyroid gland, trachea, and esophagus demonstrate no significant findings. Lungs/Pleura: Numerous peripheral some cavitating soft tissue masses, likely representing septic emboli. Bilateral small pleural effusions. Bilateral lower lobe atelectasis. Musculoskeletal: No chest wall mass or suspicious bone lesions identified. CT ABDOMEN PELVIS FINDINGS Hepatobiliary: No focal liver abnormality is seen. No gallstones,  gallbladder wall thickening, or biliary dilatation. Pancreas: Unremarkable. No pancreatic ductal dilatation or surrounding inflammatory changes. Spleen: Mild  splenomegaly with the length of the spleen measuring 13 cm. Adrenals/Urinary Tract: Adrenal glands are unremarkable. Mild perinephric stranding. Kidneys are without renal calculi, focal lesion, or hydronephrosis. Bladder is unremarkable. Stomach/Bowel: Stomach is within normal limits. Appendix appears normal. No evidence of bowel wall thickening, distention, or inflammatory changes. Fluid contents of the colon consistent with a diarrheal state. Vascular/Lymphatic: No significant vascular findings are present. Predominantly left-sided retroperitoneal lymphadenopathy with lymph nodes measuring up to 16 mm in short axis. Enlarged lymph node within the porta hepatis. Reproductive: Prostate is unremarkable. Other: Small amount of free fluid in the abdomen and pelvis. No peritoneal thickening or enhancement. Musculoskeletal: Previously demonstrated right psoas muscle abscess measures 1.9 by 1.2 cm. Known L3-4 discitis/osteomyelitis presents as narrowing of the disc space and lytic and sclerotic changes of the endplates. Associated ventral focal epidural thickening, as seen on prior MRI, causes focal narrowing of the spinal canal. Mild paraspinous edema. IMPRESSION: Innumerable bilateral pulmonary septic emboli. Mild pericardial thickening. Small volume abdominopelvic ascites. No appreciable peritoneal thickening or enhancement. Mild splenomegaly. Retroperitoneal lymphadenopathy, likely reactive. Known right psoas muscle abscess measures 1.9 cm in greatest dimension. Known L3-4 discitis/osteomyelitis with associated focal ventral epidural thickening causing narrowing of the spinal canal, and mild paraspinous edema. Diarrheal state. Electronically Signed   By: Ted Mcalpine M.D.   On: 12/26/2017 12:08   Mr Shoulder Left Wo Contrast  Addendum Date: 12/25/2017    ADDENDUM REPORT: 12/25/2017 13:51 ADDENDUM: I failed to mention that there are numerous rounded pulmonary lesions noted which are highly suspicious for septic emboli. Recommend chest CT with contrast for further evaluation. These results will be called to the ordering clinician or representative by the Radiologist Assistant, and communication documented in the PACS or zVision Dashboard. Electronically Signed   By: Rudie Meyer M.D.   On: 12/25/2017 13:51   Result Date: 12/25/2017 CLINICAL DATA:  Left shoulder pain. 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 EXAM: MRI OF THE LEFT SHOULDER WITHOUT CONTRAST TECHNIQUE: Multiplanar, multisequence MR imaging of the shoulder was performed. No intravenous contrast was administered. COMPARISON:  Radiographs 12/24/2017 FINDINGS: Rotator cuff: Mild tendinopathy/tendinosis. No partial or full-thickness rotator cuff tear. Muscles: There is moderate edema like signal abnormality in the infraspinatus and teres minor muscles suggesting myositis. If there is a history of trauma this could be a muscle tear or muscle strain. I do not see any findings suspicious for pyomyositis. Biceps long head:  Intact. Acromioclavicular Joint: No significant degenerative changes type 1 acromion. No lateral downsloping or undersurface spurring. Glenohumeral Joint: No joint effusion. Mild synovitis versus adhesive capsulitis. Labrum: Degenerated and possibly torn superior labrum. The anterior and posterior labrum appear normal. Bones: Very heterogeneous marrow signal in the metadiaphyseal region of the humerus. This is probably related to smoking or anemia. It does not have the MR appearance of osteomyelitis. There is no cortical edema or periosteal reaction or pericortical fluid. Similar appearance of the scapula and clavicle. Other: Moderate fluid in the subacromial/subdeltoid bursa. This could be simple bursitis but could not exclude septic bursitis given the patient's situation.  IMPRESSION: 1. Edema like signal abnormality in the infraspinatus and teres minor muscles could suggest a muscle strain/partial tear if there is a history of trauma. Otherwise, this could be myositis. No definite findings for pyomyositis. 2. Moderate subacromial/subdeltoid fluid could be simple bursitis or septic bursitis given the patient's clinical situation. 3. Patchy marrow signal abnormality in the humerus but no definite MR findings to suggest osteomyelitis. 4. Mild rotator cuff tendinopathy/tendinosis.  No tear. Electronically Signed: By: Rudie Meyer M.D. On: 12/25/2017 12:33   Dg Shoulder Left  Result Date: 12/24/2017 CLINICAL DATA:  Acute left shoulder pain without known injury. EXAM: LEFT SHOULDER - 2+ VIEW COMPARISON:  None. FINDINGS: There is no evidence of fracture or dislocation. There is no evidence of arthropathy or other focal bone abnormality. Soft tissues are unremarkable. IMPRESSION: Negative. Electronically Signed   By: Lupita Raider, M.D.   On: 12/24/2017 13:16      Subjective: Pt adamant that he is going home today and not willing to stay longer for treatment.  He says he feels great.    Discharge Exam: Vitals:   01/20/18 2157 01/21/18 0516  BP: (!) 95/54 110/71  Pulse: 91 78  Resp: 20   Temp: (!) 97.3 F (36.3 C) 98.3 F (36.8 C)  SpO2: 100% 100%   Vitals:   01/20/18 0604 01/20/18 1424 01/20/18 2157 01/21/18 0516  BP: 102/64 92/65 (!) 95/54 110/71  Pulse: 84 91 91 78  Resp: 18 20 20    Temp: 98.2 F (36.8 C)  (!) 97.3 F (36.3 C) 98.3 F (36.8 C)  TempSrc: Oral  Oral Oral  SpO2: 99% 100% 100% 100%  Weight:      Height:       General exam: Alert, awake, oriented x 3 Respiratory system: Clear to auscultation. Respiratory effort normal. Cardiovascular system:normal s1,s2 sounds. No murmurs, rubs, gallops. Gastrointestinal system: Abdomen is nondistended, soft and nontender. No organomegaly or masses felt. Normal bowel sounds heard. Central nervous  system: Alert and oriented. No focal neurological deficits. Extremities: No C/C/E, +pedal pulses.   Skin: No rashes, lesions or ulcers Psychiatry: Appears much less anxious.    The results of significant diagnostics from this hospitalization (including imaging, microbiology, ancillary and laboratory) are listed below for reference.    Microbiology: No results found for this or any previous visit (from the past 240 hour(s)).   Labs: BNP (last 3 results) No results for input(s): BNP in the last 8760 hours. Basic Metabolic Panel: Recent Labs  Lab 01/19/18 0431 01/21/18 0447  CREATININE 0.54* 0.68   Liver Function Tests: No results for input(s): AST, ALT, ALKPHOS, BILITOT, PROT, ALBUMIN in the last 168 hours. No results for input(s): LIPASE, AMYLASE in the last 168 hours. No results for input(s): AMMONIA in the last 168 hours. CBC: Recent Labs  Lab 01/17/18 0621  WBC 6.8  HGB 9.2*  HCT 30.3*  MCV 91.5  PLT 363   Cardiac Enzymes: No results for input(s): CKTOTAL, CKMB, CKMBINDEX, TROPONINI in the last 168 hours. BNP: Invalid input(s): POCBNP CBG: No results for input(s): GLUCAP in the last 168 hours. D-Dimer No results for input(s): DDIMER in the last 72 hours. Hgb A1c No results for input(s): HGBA1C in the last 72 hours. Lipid Profile No results for input(s): CHOL, HDL, LDLCALC, TRIG, CHOLHDL, LDLDIRECT in the last 72 hours. Thyroid function studies No results for input(s): TSH, T4TOTAL, T3FREE, THYROIDAB in the last 72 hours.  Invalid input(s): FREET3 Anemia work up No results for input(s): VITAMINB12, FOLATE, FERRITIN, TIBC, IRON, RETICCTPCT in the last 72 hours. Urinalysis    Component Value Date/Time   COLORURINE AMBER (A) 12/23/2017 1300   APPEARANCEUR CLOUDY (A) 12/23/2017 1300   LABSPEC 1.011 12/23/2017 1300   PHURINE 5.0 12/23/2017 1300   GLUCOSEU NEGATIVE 12/23/2017 1300   HGBUR NEGATIVE 12/23/2017 1300   BILIRUBINUR NEGATIVE 12/23/2017 1300    KETONESUR NEGATIVE 12/23/2017 1300   PROTEINUR 30 (A)  12/23/2017 1300   NITRITE NEGATIVE 12/23/2017 1300   LEUKOCYTESUR TRACE (A) 12/23/2017 1300   Sepsis Labs Invalid input(s): PROCALCITONIN,  WBC,  LACTICIDVEN Microbiology No results found for this or any previous visit (from the past 240 hour(s)).  Time coordinating discharge: 40 minutes  SIGNED:  Standley Dakins, MD  [facility] 01/21/2018, 10:18 AM Pager [phone]  If 7PM-7AM, please contact night-coverage [email or web address] Password [email or web address]

## 2018-01-21 NOTE — Progress Notes (Signed)
01/21/2018   Call from pharmacy.  Interaction between buspar and linezolid reported to cause severe hypertension.  I told them to cancel the buspar as the patient's only option for treating the infection is linezolid and they will fill the linezolid prescription.    Maryln Manuel. Rabon Scholle MD

## 2018-01-21 NOTE — Progress Notes (Signed)
Discharge instructions dicussed with patient and patient's mother via phone.  Both patient and mother verbalized understanding of instructions and the importance of completing antibiotic regime. Patient awaiting mothers arrival to leave.

## 2018-01-22 NOTE — Care Management (Addendum)
Received call that patient has not been able to get his Linezolid filled yet, needs prior authorization.  Updated Attending.  Returned call to patient- no answer. Will call again when prior auth is finished.

## 2018-01-22 NOTE — Progress Notes (Addendum)
01/22/2018 12:55 PM  [place] called [place] [employer] for [place].  I was told that they would fax more paperwork to me for completion.  I gave them the fax number for my office but still have not received a fax.    ________________________________________________________ 1:21 PM  Update:  I called [place] [employer] again and asked them to re-send fax and confirmed the fax number with them. They tell me it will be faxed in next 5-10 minutes.    Update  1:50 PM  I received form and faxed it back for consideration of approval, I updated care management, will try to have form scanned into EPIC.  01/22/2018 4:25 PM Still have not received word if the approval has gone through.  If medication is denied, pt will need to go to nearest ED for IV therapy with cefazolin.  I had already warned patient about the danger of not treating this infection to fully cure before he left [facility] against medical advice.        Maryln Manuel. Johnson MD

## 2018-01-22 NOTE — Care Management (Addendum)
01/22/2018: 1415 [facility] They have received prior auth fax. Will call CM within the hour to verify approval.   1445: Have started patient assistance process with [employer] for Zyvox in case it is not approved with Medicaid.  They have a new process, patient will need to also send in proof of income before approval is given.

## 2018-01-23 NOTE — Progress Notes (Addendum)
01/23/2018 10:13 AM  I received a drug service authorization from [place] Medicaid.  I completed it and faxed it back with the required support documents about his cultures and sensitivities at 1030am.  I met with care management team this morning to discuss plan.  If the medication is denied then patient will need to go back to ER which I had already told him and his mother.  Pt left hospital against medical advice and we are trying to help him get this medication to save his life as this is a life threatening infection and I had clearly expressed that to the patient and his mother on multiple occasions.     Update  I received another fax from [place].  It is looking like they are not going to approve the linezolid if we can't prove that patient can't take cefazolin of nafcillin.  I faxed a final appeal to them making them aware that both cefazolin and nafcillin are administered IV.  Pt was unwilling to stay in hospital for full 8 week course of treatment with IV antibiotics. He cannot go home with a PICC line and we were not able to find any skilled nursing facilities that would accept the patient.  The oral option was the only thing left.  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  I feel that they are likely going to deny the medication for the patient.  The patient was told that his only option was to return to hospital for IV antibiotic treatment.     Murvin Natal MD

## 2018-01-23 NOTE — Care Management (Addendum)
01/23/2018 1400  CM called [place] [employer] to follow up on drug service authorization that attending faxed this morning. They report it is another 24 hour turn around time for this fax. They report attending can call and try to have it expedited. Attending notified.   Call to patient, can only reach his mother. As discussed yesterday, she was to find proof of income (w-2, tax returns) to give today to CM in case [employer] patient assistance was needed (file has already been started by CM).   Howard Diaz (patient's mother) reports she has not been able to locate paperwork. Therefore Patient assistance paperwork can not be completed.   Howard Diaz reports patient is feeling fine.   Informed Howard Diaz that we are still waiting on approval of prior authorization, unclear when we will have the answer and that patient will need to  report back to the hospital to continue his antibiotics for his life-threatening condition.   Howard Diaz verbalized understanding.

## 2018-01-26 ENCOUNTER — Other Ambulatory Visit: Payer: Self-pay | Admitting: Pharmacist

## 2018-01-26 ENCOUNTER — Telehealth: Payer: Self-pay | Admitting: Pharmacist

## 2018-01-26 ENCOUNTER — Encounter: Payer: Self-pay | Admitting: Internal Medicine

## 2018-01-26 ENCOUNTER — Ambulatory Visit: Payer: Medicaid - Out of State | Admitting: Internal Medicine

## 2018-01-26 VITALS — BP 135/77 | HR 92 | Temp 98.2°F | Wt 192.0 lb

## 2018-01-26 DIAGNOSIS — F17210 Nicotine dependence, cigarettes, uncomplicated: Secondary | ICD-10-CM | POA: Diagnosis not present

## 2018-01-26 DIAGNOSIS — B9561 Methicillin susceptible Staphylococcus aureus infection as the cause of diseases classified elsewhere: Secondary | ICD-10-CM | POA: Diagnosis not present

## 2018-01-26 DIAGNOSIS — I2690 Septic pulmonary embolism without acute cor pulmonale: Secondary | ICD-10-CM

## 2018-01-26 DIAGNOSIS — I330 Acute and subacute infective endocarditis: Principal | ICD-10-CM

## 2018-01-26 DIAGNOSIS — B958 Unspecified staphylococcus as the cause of diseases classified elsewhere: Secondary | ICD-10-CM

## 2018-01-26 DIAGNOSIS — F1111 Opioid abuse, in remission: Secondary | ICD-10-CM | POA: Diagnosis not present

## 2018-01-26 DIAGNOSIS — M4626 Osteomyelitis of vertebra, lumbar region: Secondary | ICD-10-CM

## 2018-01-26 DIAGNOSIS — I079 Rheumatic tricuspid valve disease, unspecified: Secondary | ICD-10-CM | POA: Diagnosis not present

## 2018-01-26 DIAGNOSIS — Z880 Allergy status to penicillin: Secondary | ICD-10-CM | POA: Diagnosis not present

## 2018-01-26 DIAGNOSIS — M4646 Discitis, unspecified, lumbar region: Secondary | ICD-10-CM | POA: Diagnosis not present

## 2018-01-26 MED ORDER — LINEZOLID 600 MG PO TABS: 600.0000 mg | ORAL_TABLET | Freq: Two times a day (BID) | ORAL | 0 refills | Status: AC

## 2018-01-26 NOTE — Telephone Encounter (Signed)
Patient was supposed to be started on Zyvox x 30 days after he was discharged from the hospital but never received medication.  Called [facility] and the prescription needs a prior authorization. It was sent to the wrong doctor's office as it was not sent in with Dr. Ephriam Knucklesomer's name.  [facility] and will finish the PA once I receive it.

## 2018-01-26 NOTE — Progress Notes (Signed)
Patient ID: Howard Diaz, male   DOB: 08/17/1985, 32 y.o.   MRN: [id number]     [facility]      Reason for Consult: TV endocarditis with septic pulmonary emboli,     Referring Physician: Dr. Theodosia Quaylanford    Patient ID: Howard Diaz, male    DOB: 01/25/1986, 32 y.o.   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  His blood cultures did grow out MSSA bacteremia.  He was admitted to [facility] in early November with those findings and placed on appropriate therapy however after 4 weeks, he left AGAINST MEDICAL ADVICE.  He was supposed to start 30-day supply of Zyvox however his insurance did not cover the medication.  He is here now about 4 days out of the hospital with no medication.  He knows that he was advised to stay in the hospital to complete at least 6 if not 8 weeks of IV therapy to give him his best chance at curing his lumbar infection.  He though fortunately continues to feel well with much less back pain.  He also had a history of a hepatitis C antibody positive but his HCVRNA is negative for active disease.  He was unable to be discharged with a PICC line for home antibiotics due to his history of IV drug use. Hospital record reviewed and summarized above.   Past Medical History:  Diagnosis Date  . Alcohol abuse   . Back pain   . Cirrhosis of liver (HCC)   . Hepatitis C   . IV drug abuse (HCC)   . Jaundice     Prior to Admission medications   Medication Sig Start Date End Date Taking? Authorizing Provider  buprenorphine-naloxone (SUBOXONE) 8-2 mg SUBL SL tablet Place 1 tablet under the tongue 3 (three) times daily.   Yes [provider]  cyclobenzaprine (FLEXERIL) 10 MG tablet Take 10 mg by mouth every 8 (eight) hours. 12/11/17  Yes [provider]  lidocaine (LIDODERM) 5 % Place 1 patch onto  the skin daily. Remove & Discard patch within 12 hours or as directed by MD   Yes [provider]  linezolid (ZYVOX) 600 MG tablet Take 1 tablet (600 mg total) by mouth 2 (two) times daily. 01/21/18 02/20/18 Yes Johnson, Clanford L, MD  naproxen (NAPROSYN) 500 MG tablet Take 500 mg by mouth 2 (two) times daily. 12/11/17  Yes [provider]  busPIRone (BUSPAR) 15 MG tablet Take 1 tablet (15 mg total) by mouth 3 (three) times daily. Patient not taking: Reported on 01/26/2018 01/21/18 02/20/18  Cleora FleetJohnson, Clanford L, MD    Allergies  Allergen Reactions  . Penicillins Anaphylaxis    Has patient had a PCN reaction causing immediate rash, facial/tongue/throat swelling, SOB or lightheadedness with hypotension: Yes Has patient had a PCN reaction causing severe rash involving mucus membranes or skin necrosis: Yes Has patient had a PCN reaction that required hospitalization:Yes Has patient had a PCN reaction occurring within the last 10 years: No If all of the above answers are "NO", then may proceed with Cephalosporin use.     Social History   Tobacco Use  . Smoking status: Current Every Day Smoker    Packs/day: 1.00  . Smokeless tobacco: Never Used  Substance Use Topics  . Alcohol use: Not Currently    Frequency: Never    Comment: former  heavy drinker  . Drug use: Not on file    Comment: history of opiod abuse- months ago    [facility]: no renal disease  Review of Systems  Constitutional: negative for fevers and chills Gastrointestinal: negative for diarrhea Musculoskeletal: negative for myalgias and arthralgias All other systems reviewed and are negative    Constitutional: in no apparent distress  Vitals:   01/26/18 0949  BP: 135/77  Pulse: 92  Temp: 98.2 F (36.8 C)   EYES: anicteric ENMT: no thrush Cardiovascular: Cor RRR and No murmurs Respiratory: CTA B; normal respiratory effort GI: soft, nt Musculoskeletal: no pedal edema noted Skin: negatives: no rash Neuro:  non focal   Labs: Lab Results  Component Value Date   WBC 6.8 01/17/2018   HGB 9.2 (L) 01/17/2018   HCT 30.3 (L) 01/17/2018   MCV 91.5 01/17/2018   PLT 363 01/17/2018    Lab Results  Component Value Date   CREATININE 0.68 01/21/2018   BUN 20 01/11/2018   NA 136 01/11/2018   K 4.0 01/11/2018   CL 102 01/11/2018   CO2 27 01/11/2018    Lab Results  Component Value Date   ALT 42 12/30/2017   AST 83 (H) 12/30/2017   ALKPHOS 215 (H) 12/30/2017   BILITOT 0.7 12/30/2017     Assessment: disseminated MSSA infection.  Will try to get zyvox covered and if not use Keflex.     Plan: 1) Zyvox 2) rtc 1 week for cbc

## 2018-01-27 NOTE — Telephone Encounter (Signed)
Patient is approved for linezolid through his insurance. He has a $1 co-pay. Kathie RhodesBetty will let patient know.

## 2018-02-02 ENCOUNTER — Ambulatory Visit: Payer: Medicaid - Out of State | Admitting: Internal Medicine

## 2018-02-12 ENCOUNTER — Ambulatory Visit: Payer: Medicaid - Out of State | Admitting: Infectious Diseases

## 2018-02-27 ENCOUNTER — Ambulatory Visit: Payer: Medicaid - Out of State | Admitting: Infectious Diseases

## 2019-10-07 ENCOUNTER — Other Ambulatory Visit: Payer: Self-pay

## 2019-10-07 ENCOUNTER — Emergency Department (HOSPITAL_COMMUNITY): Payer: Medicaid - Out of State

## 2019-10-07 ENCOUNTER — Inpatient Hospital Stay (HOSPITAL_COMMUNITY)
Admission: EM | Admit: 2019-10-07 | Discharge: 2019-10-12 | DRG: 539 | Discharge status: Against Medical Advice | Payer: Medicaid - Out of State | Attending: Family Medicine | Admitting: Family Medicine

## 2019-10-07 ENCOUNTER — Encounter (HOSPITAL_COMMUNITY): Payer: Self-pay | Admitting: Emergency Medicine

## 2019-10-07 DIAGNOSIS — Z720 Tobacco use: Secondary | ICD-10-CM | POA: Diagnosis present

## 2019-10-07 DIAGNOSIS — B1920 Unspecified viral hepatitis C without hepatic coma: Secondary | ICD-10-CM | POA: Diagnosis present

## 2019-10-07 DIAGNOSIS — F1990 Other psychoactive substance use, unspecified, uncomplicated: Secondary | ICD-10-CM | POA: Diagnosis present

## 2019-10-07 DIAGNOSIS — Z6827 Body mass index (BMI) 27.0-27.9, adult: Secondary | ICD-10-CM

## 2019-10-07 DIAGNOSIS — M62838 Other muscle spasm: Secondary | ICD-10-CM | POA: Diagnosis present

## 2019-10-07 DIAGNOSIS — M48061 Spinal stenosis, lumbar region without neurogenic claudication: Secondary | ICD-10-CM | POA: Diagnosis present

## 2019-10-07 DIAGNOSIS — Z20822 Contact with and (suspected) exposure to covid-19: Secondary | ICD-10-CM | POA: Diagnosis present

## 2019-10-07 DIAGNOSIS — Z9119 Patient's noncompliance with other medical treatment and regimen: Secondary | ICD-10-CM

## 2019-10-07 DIAGNOSIS — B9689 Other specified bacterial agents as the cause of diseases classified elsewhere: Secondary | ICD-10-CM | POA: Diagnosis present

## 2019-10-07 DIAGNOSIS — R739 Hyperglycemia, unspecified: Secondary | ICD-10-CM | POA: Diagnosis present

## 2019-10-07 DIAGNOSIS — F17210 Nicotine dependence, cigarettes, uncomplicated: Secondary | ICD-10-CM | POA: Diagnosis present

## 2019-10-07 DIAGNOSIS — Z91199 Patient's noncompliance with other medical treatment and regimen due to unspecified reason: Secondary | ICD-10-CM

## 2019-10-07 DIAGNOSIS — E43 Unspecified severe protein-calorie malnutrition: Secondary | ICD-10-CM | POA: Diagnosis present

## 2019-10-07 DIAGNOSIS — M4628 Osteomyelitis of vertebra, sacral and sacrococcygeal region: Principal | ICD-10-CM | POA: Diagnosis present

## 2019-10-07 DIAGNOSIS — M545 Low back pain, unspecified: Secondary | ICD-10-CM

## 2019-10-07 DIAGNOSIS — D72829 Elevated white blood cell count, unspecified: Secondary | ICD-10-CM | POA: Diagnosis present

## 2019-10-07 DIAGNOSIS — F1020 Alcohol dependence, uncomplicated: Secondary | ICD-10-CM | POA: Diagnosis present

## 2019-10-07 DIAGNOSIS — R7881 Bacteremia: Secondary | ICD-10-CM | POA: Diagnosis present

## 2019-10-07 DIAGNOSIS — Z880 Allergy status to penicillin: Secondary | ICD-10-CM

## 2019-10-07 DIAGNOSIS — B9561 Methicillin susceptible Staphylococcus aureus infection as the cause of diseases classified elsewhere: Secondary | ICD-10-CM | POA: Diagnosis present

## 2019-10-07 DIAGNOSIS — M4626 Osteomyelitis of vertebra, lumbar region: Secondary | ICD-10-CM

## 2019-10-07 DIAGNOSIS — L089 Local infection of the skin and subcutaneous tissue, unspecified: Secondary | ICD-10-CM | POA: Diagnosis present

## 2019-10-07 DIAGNOSIS — I330 Acute and subacute infective endocarditis: Secondary | ICD-10-CM | POA: Diagnosis present

## 2019-10-07 DIAGNOSIS — M4646 Discitis, unspecified, lumbar region: Secondary | ICD-10-CM | POA: Diagnosis present

## 2019-10-07 DIAGNOSIS — M47816 Spondylosis without myelopathy or radiculopathy, lumbar region: Secondary | ICD-10-CM | POA: Diagnosis present

## 2019-10-07 DIAGNOSIS — M549 Dorsalgia, unspecified: Secondary | ICD-10-CM | POA: Diagnosis present

## 2019-10-07 DIAGNOSIS — F1510 Other stimulant abuse, uncomplicated: Secondary | ICD-10-CM | POA: Diagnosis present

## 2019-10-07 DIAGNOSIS — M5459 Other low back pain: Secondary | ICD-10-CM | POA: Diagnosis present

## 2019-10-07 DIAGNOSIS — K7030 Alcoholic cirrhosis of liver without ascites: Secondary | ICD-10-CM | POA: Diagnosis present

## 2019-10-07 DIAGNOSIS — B958 Unspecified staphylococcus as the cause of diseases classified elsewhere: Secondary | ICD-10-CM | POA: Diagnosis present

## 2019-10-07 LAB — COMPREHENSIVE METABOLIC PANEL
ALT: 17 U/L (ref 0–44)
AST: 19 U/L (ref 15–41)
Albumin: 3.9 g/dL (ref 3.5–5.0)
Alkaline Phosphatase: 63 U/L (ref 38–126)
Anion gap: 10 (ref 5–15)
BUN: 8 mg/dL (ref 6–20)
CO2: 23 mmol/L (ref 22–32)
Calcium: 8.6 mg/dL — ABNORMAL LOW (ref 8.9–10.3)
Chloride: 100 mmol/L (ref 98–111)
Creatinine, Ser: 0.67 mg/dL (ref 0.61–1.24)
GFR calc Af Amer: >60 mL/min (ref >60)
GFR calc non Af Amer: >60 mL/min (ref >60)
Glucose, Bld: 131 mg/dL — ABNORMAL HIGH (ref 70–99)
Potassium: 3.6 mmol/L (ref 3.5–5.1)
Sodium: 133 mmol/L — ABNORMAL LOW (ref 135–145)
Total Bilirubin: 0.7 mg/dL (ref 0.3–1.2)
Total Protein: 7.3 g/dL (ref 6.5–8.1)

## 2019-10-07 LAB — TROPONIN I (HIGH SENSITIVITY)
Troponin I (High Sensitivity): <2 ng/L (ref <18)
Troponin I (High Sensitivity): <2 ng/L (ref <18)

## 2019-10-07 LAB — URINALYSIS, ROUTINE W REFLEX MICROSCOPIC
Bilirubin Urine: NEGATIVE
Glucose, UA: NEGATIVE mg/dL
Hgb urine dipstick: NEGATIVE
Ketones, ur: NEGATIVE mg/dL
Leukocytes,Ua: NEGATIVE
Nitrite: NEGATIVE
Protein, ur: NEGATIVE mg/dL
Specific Gravity, Urine: 1.012 (ref 1.005–1.030)
pH: 9 — ABNORMAL HIGH (ref 5.0–8.0)

## 2019-10-07 LAB — CBC WITH DIFFERENTIAL/PLATELET
Abs Immature Granulocytes: 0.04 10*3/uL (ref 0.00–0.07)
Basophils Absolute: 0 10*3/uL (ref 0.0–0.1)
Basophils Relative: 0 %
Eosinophils Absolute: 0 10*3/uL (ref 0.0–0.5)
Eosinophils Relative: 0 %
HCT: 38.7 % — ABNORMAL LOW (ref 39.0–52.0)
Hemoglobin: 13.6 g/dL (ref 13.0–17.0)
Immature Granulocytes: 0 %
Lymphocytes Relative: 10 %
Lymphs Abs: 1.3 10*3/uL (ref 0.7–4.0)
MCH: 31.1 pg (ref 26.0–34.0)
MCHC: 35.1 g/dL (ref 30.0–36.0)
MCV: 88.6 fL (ref 80.0–100.0)
Monocytes Absolute: 1.4 10*3/uL — ABNORMAL HIGH (ref 0.1–1.0)
Monocytes Relative: 11 %
Neutro Abs: 10.8 10*3/uL — ABNORMAL HIGH (ref 1.7–7.7)
Neutrophils Relative %: 79 %
Platelets: 231 10*3/uL (ref 150–400)
RBC: 4.37 MIL/uL (ref 4.22–5.81)
RDW: 11.4 % — ABNORMAL LOW (ref 11.5–15.5)
WBC: 13.7 10*3/uL — ABNORMAL HIGH (ref 4.0–10.5)
nRBC: 0 % (ref 0.0–0.2)

## 2019-10-07 LAB — PROTIME-INR
INR: 1.1 (ref 0.8–1.2)
Prothrombin Time: 13.5 seconds (ref 11.4–15.2)

## 2019-10-07 LAB — RAPID URINE DRUG SCREEN, HOSP PERFORMED
Amphetamines: POSITIVE — AB
Barbiturates: NOT DETECTED
Benzodiazepines: NOT DETECTED
Cocaine: NOT DETECTED
Opiates: NOT DETECTED
Tetrahydrocannabinol: POSITIVE — AB

## 2019-10-07 LAB — LACTIC ACID, PLASMA
Lactic Acid, Venous: 1.1 mmol/L (ref 0.5–1.9)
Lactic Acid, Venous: 0.8 mmol/L (ref 0.5–1.9)

## 2019-10-07 LAB — ETHANOL: Alcohol, Ethyl (B): <10 mg/dL (ref <10)

## 2019-10-07 LAB — SARS CORONAVIRUS 2 BY RT PCR (HOSPITAL ORDER, PERFORMED IN ~~LOC~~ HOSPITAL LAB): SARS Coronavirus 2: NEGATIVE

## 2019-10-07 LAB — C-REACTIVE PROTEIN: CRP: 8.3 mg/dL — ABNORMAL HIGH (ref <1.0)

## 2019-10-07 MED ORDER — LORAZEPAM 2 MG/ML IJ SOLN
0.0000 mg | Freq: Four times a day (QID) | INTRAMUSCULAR | Status: AC
  Administered 2019-10-08 – 2019-10-09 (×2): 2 mg via INTRAVENOUS
  Filled 2019-10-07 (×4): qty 1

## 2019-10-07 MED ORDER — THIAMINE HCL 100 MG/ML IJ SOLN
100.0000 mg | Freq: Every day | INTRAMUSCULAR | Status: DC
  Administered 2019-10-08: 100 mg via INTRAVENOUS
  Filled 2019-10-07 (×2): qty 2

## 2019-10-07 MED ORDER — MORPHINE SULFATE (PF) 4 MG/ML IV SOLN
4.0000 mg | Freq: Once | INTRAVENOUS | Status: AC
  Administered 2019-10-07: 4 mg via INTRAVENOUS
  Filled 2019-10-07: qty 1

## 2019-10-07 MED ORDER — THIAMINE HCL 100 MG PO TABS
100.0000 mg | ORAL_TABLET | Freq: Every day | ORAL | Status: DC
  Administered 2019-10-09 – 2019-10-11 (×3): 100 mg via ORAL
  Filled 2019-10-07 (×5): qty 1

## 2019-10-07 MED ORDER — ONDANSETRON HCL 4 MG/2ML IJ SOLN
4.0000 mg | Freq: Once | INTRAMUSCULAR | Status: AC
  Administered 2019-10-07: 4 mg via INTRAVENOUS
  Filled 2019-10-07: qty 2

## 2019-10-07 MED ORDER — LORAZEPAM 1 MG PO TABS
0.0000 mg | ORAL_TABLET | Freq: Four times a day (QID) | ORAL | Status: AC
  Administered 2019-10-08 – 2019-10-09 (×3): 1 mg via ORAL
  Filled 2019-10-07: qty 1
  Filled 2019-10-07: qty 2
  Filled 2019-10-07: qty 1

## 2019-10-07 MED ORDER — LORAZEPAM 2 MG/ML IJ SOLN
0.0000 mg | Freq: Two times a day (BID) | INTRAMUSCULAR | Status: DC
  Administered 2019-10-10: 2 mg via INTRAVENOUS
  Filled 2019-10-07: qty 1

## 2019-10-07 MED ORDER — IOHEXOL 300 MG/ML  SOLN
100.0000 mL | Freq: Once | INTRAMUSCULAR | Status: AC | PRN
  Administered 2019-10-07: 100 mL via INTRAVENOUS

## 2019-10-07 MED ORDER — HYDROMORPHONE HCL 1 MG/ML IJ SOLN
1.0000 mg | Freq: Once | INTRAMUSCULAR | Status: AC
  Administered 2019-10-07: 1 mg via INTRAVENOUS
  Filled 2019-10-07: qty 1

## 2019-10-07 MED ORDER — METHOCARBAMOL 500 MG PO TABS
1000.0000 mg | ORAL_TABLET | Freq: Once | ORAL | Status: AC
  Administered 2019-10-07: 1000 mg via ORAL
  Filled 2019-10-07: qty 2

## 2019-10-07 MED ORDER — LORAZEPAM 2 MG/ML IJ SOLN
1.0000 mg | Freq: Once | INTRAMUSCULAR | Status: AC
  Administered 2019-10-07: 1 mg via INTRAVENOUS
  Filled 2019-10-07: qty 1

## 2019-10-07 MED ORDER — LORAZEPAM 1 MG PO TABS: 0.0000 mg | ORAL_TABLET | Freq: Two times a day (BID) | ORAL | Status: DC

## 2019-10-07 NOTE — ED Provider Notes (Signed)
[facility] ([facility]) EMERGENCY DEPARTMENT Provider Note   CSN: [id number] Arrival date & time: 10/07/19  1745     History Chief Complaint  Patient presents with  . Back Pain    Howard Diaz is a 34 y.o. male.  HPI      Howard Diaz is a 34 y.o. male, with a history of alcohol abuse, cirrhosis of the liver, hepatitis C, IV drug use, osteomyelitis of the spine, endocarditis, presenting to the ED with back pain beginning yesterday and worsening this morning. Pain is in the lower back, across the entire lower back, cramping and stabbing, severe. Also notes chest pain and chest tightness and diaphoresis.  Subjective fever. He states symptoms feel similar to when he presented to the ED and it was found he had osteomyelitis and discitis of the spine as well as endocarditis. He is on Suboxone.  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 He drinks alcohol with a couple of drinks every other day.   Denies shortness of breath, cough, vomiting, diarrhea, abdominal pain, numbness, weakness, changes in bowel or bladder function, or any other complaints.   Past Medical History:  Diagnosis Date  . Alcohol abuse   . Back pain   . Cirrhosis of liver (HCC)   . Hepatitis C   . IV drug abuse (HCC)   . Jaundice     Patient Active Problem List   Diagnosis Date Noted  . 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 Protein-calorie malnutrition, severe 01/02/2018  . Osteomyelitis (HCC) 12/23/2017  . IV drug user 12/23/2017  . Acute osteomyelitis of lumbar spine (HCC) 12/23/2017  . Hepatitis-C 12/23/2017  . Lactic acid acidosis 12/23/2017  . Sepsis (HCC) 12/23/2017  . Hypokalemia 12/23/2017  . Tobacco abuse     History reviewed. No pertinent surgical history.     History reviewed. No pertinent family history.  Social History   Tobacco Use  . Smoking status: Current Every Day Smoker    Packs/day: 1.00  . Smokeless tobacco: Never Used  Vaping Use  . Vaping Use: Former  . Substances:  Nicotine  Substance Use Topics  . Alcohol use: Not Currently    Alcohol/week: 14.0 standard drinks    Types: 14 Shots of liquor per week    Comment: 1-2 a day  . Drug use: Yes    Frequency: 3.0 times per week    Types: Marijuana, Methamphetamines    Comment: meth use a week ago    Home Medications Prior to Admission medications   Medication Sig Start Date End Date Taking? Authorizing Provider  buprenorphine-naloxone (SUBOXONE) 8-2 mg SUBL SL tablet Place 1 tablet under the tongue 3 (three) times daily.    [provider]  cyclobenzaprine (FLEXERIL) 10 MG tablet Take 10 mg by mouth every 8 (eight) hours. 12/11/17   [provider]  lidocaine (LIDODERM) 5 % Place 1 patch onto the skin daily. Remove & Discard patch within 12 hours or as directed by MD    [provider]  naproxen (NAPROSYN) 500 MG tablet Take 500 mg by mouth 2 (two) times daily. 12/11/17   [provider]    Allergies    Penicillins  Review of Systems   Review of Systems  Constitutional: Positive for diaphoresis and fever.  Respiratory: Negative for cough and shortness of breath.   Cardiovascular: Positive for chest pain. Negative for leg swelling.  Gastrointestinal: Positive for nausea. Negative for abdominal pain, blood in stool, diarrhea and vomiting.  Genitourinary: Negative for difficulty urinating.  Musculoskeletal: Positive for back pain.  Neurological: Negative for syncope, weakness, numbness and headaches.  All other systems reviewed and are negative.   Physical Exam Updated Vital Signs BP 114/73 (BP Location: Right Arm)   Pulse 79   Temp 98.4 F (36.9 C) (Oral)   Resp 20   Ht 6' (1.829 m)   Wt 90.7 kg   SpO2 99%   BMI 27.12 kg/m   Physical Exam Vitals and nursing note reviewed.  Constitutional:      General: He is in acute distress (pain).     Appearance: He is well-developed. He is diaphoretic.  HENT:     Head: Normocephalic and atraumatic.      Mouth/Throat:     Mouth: Mucous membranes are moist.     Pharynx: Oropharynx is clear.  Eyes:     Conjunctiva/sclera: Conjunctivae normal.  Cardiovascular:     Rate and Rhythm: Normal rate and regular rhythm.     Pulses: Normal pulses.          Radial pulses are 2+ on the right side and 2+ on the left side.       Posterior tibial pulses are 2+ on the right side and 2+ on the left side.     Heart sounds: Normal heart sounds.     Comments: Tactile temperature in the extremities appropriate and equal bilaterally. Pulmonary:     Effort: Pulmonary effort is normal. No respiratory distress.     Breath sounds: Normal breath sounds.  Abdominal:     Palpations: Abdomen is soft.     Tenderness: There is no abdominal tenderness. There is no guarding.  Musculoskeletal:     Cervical back: Neck supple.       Back:     Right lower leg: No edema.     Left lower leg: No edema.  Lymphadenopathy:     Cervical: No cervical adenopathy.  Skin:    General: Skin is warm.  Neurological:     Mental Status: He is alert.     Comments: No noted acute cognitive deficit. Sensation grossly intact to light touch in the extremities.   Grip strengths equal bilaterally.   Strength 5/5 in all extremities.  Coordination intact.  Cranial nerves III-XII grossly intact.  Handles oral secretions without noted difficulty.  No noted phonation or speech deficit. No facial droop.   Psychiatric:        Mood and Affect: Mood and affect normal.        Speech: Speech normal.        Behavior: Behavior normal.     ED Results / Procedures / Treatments   Labs (all labs ordered are listed, but only abnormal results are displayed) Labs Reviewed  COMPREHENSIVE METABOLIC PANEL - Abnormal; Notable for the following components:      Result Value   Sodium 133 (*)    Glucose, Bld 131 (*)    Calcium 8.6 (*)    All other components within normal limits  CBC WITH DIFFERENTIAL/PLATELET - Abnormal; Notable for the following  components:   WBC 13.7 (*)    HCT 38.7 (*)    RDW 11.4 (*)    Neutro Abs 10.8 (*)    Monocytes Absolute 1.4 (*)    All other components within normal limits  URINALYSIS, ROUTINE W REFLEX MICROSCOPIC - Abnormal; Notable for the following components:   pH 9.0 (*)    All other components within normal limits  RAPID URINE DRUG SCREEN, HOSP PERFORMED - Abnormal;  Notable for the following components:   Amphetamines POSITIVE (*)    Tetrahydrocannabinol POSITIVE (*)    All other components within normal limits  C-REACTIVE PROTEIN - Abnormal; Notable for the following components:   CRP 8.3 (*)    All other components within normal limits  SARS CORONAVIRUS 2 BY RT PCR (HOSPITAL ORDER, PERFORMED IN [facility])  CULTURE, BLOOD (ROUTINE X 2)  CULTURE, BLOOD (ROUTINE X 2)  ETHANOL  PROTIME-INR  LACTIC ACID, PLASMA  LACTIC ACID, PLASMA  SEDIMENTATION RATE  TROPONIN I (HIGH SENSITIVITY)  TROPONIN I (HIGH SENSITIVITY)    EKG EKG Interpretation  Date/Time:  Thursday October 07 2019 20:20:23 EDT Ventricular Rate:  78 PR Interval:    QRS Duration: 99 QT Interval:  399 QTC Calculation: 455 R Axis:   33 Text Interpretation: Sinus rhythm No significant change since last tracing Confirmed by Linwood Dibbles [phone]) on 10/07/2019 8:46:55 PM   Radiology CT THORACIC SPINE W CONTRAST  Result Date: 10/07/2019 CLINICAL DATA:  Low back pain that began this morning, elevated white blood cell count, staph infection EXAM: CT THORACIC AND LUMBAR SPINE WITH CONTRAST TECHNIQUE: Multidetector CT imaging of the thoracic and lumbar spine was performed with contrast. Multiplanar CT image reconstructions were also generated. CONTRAST:  100 cc Omnipaque 300 COMPARISON:  None. FINDINGS: CT THORACIC SPINE FINDINGS Alignment: Alignment is anatomic. Vertebrae: No acute fracture or focal pathologic process. Paraspinal and other soft tissues: Paraspinal soft tissues are unremarkable. Minimal atelectasis at the  lung bases. Visualized intra-abdominal structures are unremarkable. Disc levels: There is minimal spondylosis at the cervicothoracic junction and within the lower thoracic spine. No compressive sequela. CT LUMBAR SPINE FINDINGS Segmentation: 5 lumbar type vertebrae. Alignment: Alignment is anatomic. Vertebrae: Marked degenerative endplate changes are seen surrounding the L3/L4 disc space. There are bilateral pars defects of L3 without spondylolisthesis. There are no acute displaced fractures. Paraspinal and other soft tissues: Paraspinal soft tissues are unremarkable. Visualized retroperitoneal structures are normal. Disc levels: There is severe spondylosis at L3/L4 with moderate central canal stenosis. There is bilateral neural foraminal narrowing. At L4/L5 there is a left paracentral disc protrusion with bilateral ligamentum flavum hypertrophy resulting in mild-to-moderate central canal stenosis. There is mild symmetrical neural foraminal encroachment. At L5/S1 there is mild left paracentral disc bulge with left lateral recess and neural foraminal encroachment. IMPRESSION: CT THORACIC SPINE IMPRESSION 1. Mild spondylosis.  No acute or destructive bony lesion. CT LUMBAR SPINE IMPRESSION 1. Lower lumbar spondylosis most pronounced at L3/L4. Moderate central stenosis at L3/L4, with mild stenosis at L4/L5. Left predominant compressive sequela at L4/L5 and L5/S1. 2. No acute or destructive bony lesion. Electronically Signed   By: Sharlet Salina M.D.   On: 10/07/2019 22:48   CT LUMBAR SPINE W CONTRAST  Result Date: 10/07/2019 CLINICAL DATA:  Low back pain that began this morning, elevated white blood cell count, staph infection EXAM: CT THORACIC AND LUMBAR SPINE WITH CONTRAST TECHNIQUE: Multidetector CT imaging of the thoracic and lumbar spine was performed with contrast. Multiplanar CT image reconstructions were also generated. CONTRAST:  100 cc Omnipaque 300 COMPARISON:  None. FINDINGS: CT THORACIC SPINE FINDINGS  Alignment: Alignment is anatomic. Vertebrae: No acute fracture or focal pathologic process. Paraspinal and other soft tissues: Paraspinal soft tissues are unremarkable. Minimal atelectasis at the lung bases. Visualized intra-abdominal structures are unremarkable. Disc levels: There is minimal spondylosis at the cervicothoracic junction and within the lower thoracic spine. No compressive sequela. CT LUMBAR SPINE FINDINGS Segmentation: 5 lumbar type  vertebrae. Alignment: Alignment is anatomic. Vertebrae: Marked degenerative endplate changes are seen surrounding the L3/L4 disc space. There are bilateral pars defects of L3 without spondylolisthesis. There are no acute displaced fractures. Paraspinal and other soft tissues: Paraspinal soft tissues are unremarkable. Visualized retroperitoneal structures are normal. Disc levels: There is severe spondylosis at L3/L4 with moderate central canal stenosis. There is bilateral neural foraminal narrowing. At L4/L5 there is a left paracentral disc protrusion with bilateral ligamentum flavum hypertrophy resulting in mild-to-moderate central canal stenosis. There is mild symmetrical neural foraminal encroachment. At L5/S1 there is mild left paracentral disc bulge with left lateral recess and neural foraminal encroachment. IMPRESSION: CT THORACIC SPINE IMPRESSION 1. Mild spondylosis.  No acute or destructive bony lesion. CT LUMBAR SPINE IMPRESSION 1. Lower lumbar spondylosis most pronounced at L3/L4. Moderate central stenosis at L3/L4, with mild stenosis at L4/L5. Left predominant compressive sequela at L4/L5 and L5/S1. 2. No acute or destructive bony lesion. Electronically Signed   By: Sharlet SalinaMichael  Brown M.D.   On: 10/07/2019 22:48    Procedures Procedures (including critical care time)  Medications Ordered in ED Medications  LORazepam (ATIVAN) injection 0-4 mg (has no administration in time range)    Or  LORazepam (ATIVAN) tablet 0-4 mg (has no administration in time range)    LORazepam (ATIVAN) injection 0-4 mg (has no administration in time range)    Or  LORazepam (ATIVAN) tablet 0-4 mg (has no administration in time range)  thiamine tablet 100 mg (has no administration in time range)    Or  thiamine (B-1) injection 100 mg (has no administration in time range)  morphine 4 MG/ML injection 4 mg (4 mg Intravenous Given 10/07/19 1940)  ondansetron (ZOFRAN) injection 4 mg (4 mg Intravenous Given 10/07/19 1939)  methocarbamol (ROBAXIN) tablet 1,000 mg (1,000 mg Oral Given 10/07/19 1942)  LORazepam (ATIVAN) injection 1 mg (1 mg Intravenous Given 10/07/19 2108)  iohexol (OMNIPAQUE) 300 MG/ML solution 100 mL (100 mLs Intravenous Contrast Given 10/07/19 2218)  HYDROmorphone (DILAUDID) injection 1 mg (1 mg Intravenous Given 10/07/19 2235)    ED Course  I have reviewed the triage vital signs and the nursing notes.  Pertinent labs & imaging results that were available during my care of the patient were reviewed by me and considered in my medical decision making (see chart for details).  Clinical Course as of Aug 20 0002  Thu Oct 07, 2019  2003 Patient states he does not feel any improvement in his pain, however, he appears less distressed.   [SJ]  2311 Spoke with Dr. Carren RangZierle-Ghosh, hospitalist. Agrees to admit the patient.   [SJ]    Clinical Course User Index [SJ] Alaine Loughney, Hillard DankerShawn C, PA-C   MDM Rules/Calculators/A&P                          Patient presents with onset of severe back pain as well as chest pain. Patient is nontoxic appearing, afebrile, not tachycardic, not tachypneic, not hypotensive, maintains excellent SPO2 on room air.  I have reviewed the patient's chart to obtain more information.   I reviewed and interpreted the patient's labs and radiological studies. Leukocytosis present. CT of the thoracic and lumbar spine without noted acute abnormalities.  MRI not available at this facility at this time.  Patient does appear to be in quite a bit of pain.  We  have made an effort to adequately control the patient's pain, however, we have been unable to do so thus far.  Findings and plan of care discussed with Linwood Dibbles, MD.   Vitals:   10/07/19 1824 10/07/19 2117  BP: 114/73 127/75  Pulse: 79 89  Resp: 20 17  Temp: 98.4 F (36.9 C) 98.9 F (37.2 C)  TempSrc: Oral Oral  SpO2: 99% 96%  Weight: 90.7 kg   Height: 6' (1.829 m)      Final Clinical Impression(s) / ED Diagnoses Final diagnoses:  Acute bilateral low back pain without sciatica    Rx / DC Orders ED Discharge Orders    None       Concepcion Living 10/08/19 0002    Linwood Dibbles, MD 10/08/19 [phone]

## 2019-10-07 NOTE — ED Triage Notes (Signed)
Patient complains of back pain that began this morning. Pt states he has staph infection all over his body and multiple abscesses in his mouth.

## 2019-10-08 ENCOUNTER — Observation Stay (HOSPITAL_COMMUNITY): Payer: Medicaid - Out of State

## 2019-10-08 ENCOUNTER — Encounter (HOSPITAL_COMMUNITY): Payer: Self-pay | Admitting: Family Medicine

## 2019-10-08 DIAGNOSIS — M545 Low back pain, unspecified: Secondary | ICD-10-CM

## 2019-10-08 DIAGNOSIS — Z9119 Patient's noncompliance with other medical treatment and regimen: Secondary | ICD-10-CM

## 2019-10-08 DIAGNOSIS — M549 Dorsalgia, unspecified: Secondary | ICD-10-CM | POA: Diagnosis present

## 2019-10-08 DIAGNOSIS — Z91199 Patient's noncompliance with other medical treatment and regimen due to unspecified reason: Secondary | ICD-10-CM

## 2019-10-08 DIAGNOSIS — D72829 Elevated white blood cell count, unspecified: Secondary | ICD-10-CM | POA: Diagnosis present

## 2019-10-08 DIAGNOSIS — M4646 Discitis, unspecified, lumbar region: Secondary | ICD-10-CM | POA: Diagnosis present

## 2019-10-08 DIAGNOSIS — B9561 Methicillin susceptible Staphylococcus aureus infection as the cause of diseases classified elsewhere: Secondary | ICD-10-CM | POA: Diagnosis present

## 2019-10-08 LAB — CBC WITH DIFFERENTIAL/PLATELET
Abs Immature Granulocytes: 0.05 10*3/uL (ref 0.00–0.07)
Basophils Absolute: 0 10*3/uL (ref 0.0–0.1)
Basophils Relative: 0 %
Eosinophils Absolute: 0 10*3/uL (ref 0.0–0.5)
Eosinophils Relative: 0 %
HCT: 40.5 % (ref 39.0–52.0)
Hemoglobin: 14.1 g/dL (ref 13.0–17.0)
Immature Granulocytes: 0 %
Lymphocytes Relative: 7 %
Lymphs Abs: 1.1 10*3/uL (ref 0.7–4.0)
MCH: 31 pg (ref 26.0–34.0)
MCHC: 34.8 g/dL (ref 30.0–36.0)
MCV: 89 fL (ref 80.0–100.0)
Monocytes Absolute: 1.7 10*3/uL — ABNORMAL HIGH (ref 0.1–1.0)
Monocytes Relative: 12 %
Neutro Abs: 12.2 10*3/uL — ABNORMAL HIGH (ref 1.7–7.7)
Neutrophils Relative %: 81 %
Platelets: 213 10*3/uL (ref 150–400)
RBC: 4.55 MIL/uL (ref 4.22–5.81)
RDW: 11.6 % (ref 11.5–15.5)
WBC: 15 10*3/uL — ABNORMAL HIGH (ref 4.0–10.5)
nRBC: 0 % (ref 0.0–0.2)

## 2019-10-08 LAB — BLOOD CULTURE ID PANEL (REFLEXED) - BCID2

## 2019-10-08 LAB — COMPREHENSIVE METABOLIC PANEL
ALT: 16 U/L (ref 0–44)
AST: 16 U/L (ref 15–41)
Albumin: 3.7 g/dL (ref 3.5–5.0)
Alkaline Phosphatase: 64 U/L (ref 38–126)
Anion gap: 10 (ref 5–15)
BUN: 8 mg/dL (ref 6–20)
CO2: 23 mmol/L (ref 22–32)
Calcium: 8.5 mg/dL — ABNORMAL LOW (ref 8.9–10.3)
Chloride: 99 mmol/L (ref 98–111)
Creatinine, Ser: 0.67 mg/dL (ref 0.61–1.24)
GFR calc Af Amer: >60 mL/min (ref >60)
GFR calc non Af Amer: >60 mL/min (ref >60)
Glucose, Bld: 152 mg/dL — ABNORMAL HIGH (ref 70–99)
Potassium: 3.5 mmol/L (ref 3.5–5.1)
Sodium: 132 mmol/L — ABNORMAL LOW (ref 135–145)
Total Bilirubin: 0.8 mg/dL (ref 0.3–1.2)
Total Protein: 7.2 g/dL (ref 6.5–8.1)

## 2019-10-08 LAB — MAGNESIUM: Magnesium: 1.8 mg/dL (ref 1.7–2.4)

## 2019-10-08 LAB — SEDIMENTATION RATE: Sed Rate: 8 mm/hr (ref 0–16)

## 2019-10-08 LAB — HIV ANTIBODY (ROUTINE TESTING W REFLEX): HIV Screen 4th Generation wRfx: NONREACTIVE

## 2019-10-08 MED ORDER — CEFAZOLIN SODIUM-DEXTROSE 2-4 GM/100ML-% IV SOLN
2.0000 g | Freq: Three times a day (TID) | INTRAVENOUS | Status: DC
  Administered 2019-10-08 – 2019-10-12 (×11): 2 g via INTRAVENOUS
  Filled 2019-10-08 (×12): qty 100

## 2019-10-08 MED ORDER — ACETAMINOPHEN 650 MG RE SUPP: 650.0000 mg | Freq: Four times a day (QID) | RECTAL | Status: DC | PRN

## 2019-10-08 MED ORDER — LIDOCAINE 5 % EX PTCH
1.0000 | MEDICATED_PATCH | CUTANEOUS | Status: DC
  Administered 2019-10-08 – 2019-10-12 (×5): 1 via TRANSDERMAL
  Filled 2019-10-08 (×5): qty 1

## 2019-10-08 MED ORDER — SACCHAROMYCES BOULARDII 250 MG PO CAPS
250.0000 mg | ORAL_CAPSULE | Freq: Two times a day (BID) | ORAL | Status: DC
  Administered 2019-10-08 – 2019-10-11 (×7): 250 mg via ORAL
  Filled 2019-10-08 (×7): qty 1

## 2019-10-08 MED ORDER — HEPARIN SODIUM (PORCINE) 5000 UNIT/ML IJ SOLN
5000.0000 [IU] | Freq: Three times a day (TID) | INTRAMUSCULAR | Status: DC
  Administered 2019-10-08 – 2019-10-10 (×5): 5000 [IU] via SUBCUTANEOUS
  Filled 2019-10-08 (×5): qty 1

## 2019-10-08 MED ORDER — ONDANSETRON HCL 4 MG PO TABS: 4.0000 mg | ORAL_TABLET | Freq: Four times a day (QID) | ORAL | Status: DC | PRN

## 2019-10-08 MED ORDER — ONDANSETRON HCL 4 MG/2ML IJ SOLN: 4.0000 mg | Freq: Four times a day (QID) | INTRAMUSCULAR | Status: DC | PRN

## 2019-10-08 MED ORDER — NICOTINE 21 MG/24HR TD PT24
21.0000 mg | MEDICATED_PATCH | Freq: Every day | TRANSDERMAL | Status: DC | PRN
  Filled 2019-10-08: qty 1

## 2019-10-08 MED ORDER — HYDROCODONE-ACETAMINOPHEN 5-325 MG PO TABS
1.0000 | ORAL_TABLET | Freq: Four times a day (QID) | ORAL | Status: DC | PRN
  Administered 2019-10-08: 1 via ORAL
  Filled 2019-10-08: qty 1

## 2019-10-08 MED ORDER — KETOROLAC TROMETHAMINE 30 MG/ML IJ SOLN
30.0000 mg | Freq: Four times a day (QID) | INTRAMUSCULAR | Status: DC | PRN
  Administered 2019-10-08 – 2019-10-12 (×12): 30 mg via INTRAVENOUS
  Filled 2019-10-08 (×13): qty 1

## 2019-10-08 MED ORDER — HYDROCODONE-ACETAMINOPHEN 5-325 MG PO TABS
1.0000 | ORAL_TABLET | Freq: Four times a day (QID) | ORAL | Status: DC | PRN
  Administered 2019-10-08 – 2019-10-11 (×9): 2 via ORAL
  Administered 2019-10-11 – 2019-10-12 (×3): 1 via ORAL
  Filled 2019-10-08 (×3): qty 2
  Filled 2019-10-08: qty 1
  Filled 2019-10-08: qty 2
  Filled 2019-10-08: qty 1
  Filled 2019-10-08 (×3): qty 2
  Filled 2019-10-08 (×2): qty 1
  Filled 2019-10-08: qty 2
  Filled 2019-10-08: qty 1

## 2019-10-08 MED ORDER — POLYETHYLENE GLYCOL 3350 17 G PO PACK: 17.0000 g | PACK | Freq: Every day | ORAL | Status: DC | PRN

## 2019-10-08 MED ORDER — ADULT MULTIVITAMIN W/MINERALS CH
1.0000 | ORAL_TABLET | Freq: Every day | ORAL | Status: DC
  Administered 2019-10-08 – 2019-10-11 (×4): 1 via ORAL
  Filled 2019-10-08 (×5): qty 1

## 2019-10-08 MED ORDER — GADOBUTROL 1 MMOL/ML IV SOLN
9.0000 mL | Freq: Once | INTRAVENOUS | Status: AC | PRN
  Administered 2019-10-08: 9 mL via INTRAVENOUS

## 2019-10-08 MED ORDER — ACETAMINOPHEN 325 MG PO TABS: 650.0000 mg | ORAL_TABLET | Freq: Four times a day (QID) | ORAL | Status: DC | PRN

## 2019-10-08 MED ORDER — CYCLOBENZAPRINE HCL 10 MG PO TABS
5.0000 mg | ORAL_TABLET | Freq: Three times a day (TID) | ORAL | Status: DC | PRN
  Administered 2019-10-09 – 2019-10-11 (×6): 5 mg via ORAL
  Filled 2019-10-08 (×6): qty 1

## 2019-10-08 NOTE — Progress Notes (Signed)
CRITICAL VALUE ALERT  Critical Value:  Gram positive cocci  in anaerobic &aerobic bottles  Date & Time Notied:  10/08/19 @ 1520  Provider Notified: Philippa Chester  Orders Received/Actions taken: No new orders at this time. Will continue to monitor patient.

## 2019-10-08 NOTE — Progress Notes (Signed)
10/08/2019 3:56 PM  I reviewed case with Dr. Luciana Axe.  We both saw patient in late 2019 when patient was initially diagnosed with acute disciitis and osteomyelitis of L3/L4 and endocarditis.  He had an MSSA infection.  He stayed in hospital for 4 weeks and then left AMA. He followed up with Dr. Luciana Axe at the [facility] in [place] in Dec 2019 and Dr. Luciana Axe tried to get him zyvox.  Pt's mother said that they were able to get the zyvox about 3 weeks later but patient never completed all of it.  He has been lost to follow up as he never followed up with the [facility] or Dr. Luciana Axe since that initial visit.  He has been lost to follow up.  He presented to ED complaining of low back pain.  His blood culture positive for G+ cocci in aerobic and anaerobic bottles.  I discussed with Dr. Luciana Axe.  He says 2D echo is fine to get.  Doesn't need another TEE. He needs at least 4 weeks IV cefazolin and outpatient follow up at [facility] and they will decide further management and can set up outpatient infusions etc. If patient absolutely refuses to stay in hospital we could offer a dose of oritavancin as a last resort and outpatient follow up with the [facility] clinic.  With his substance abuse history would not offer a PICC line at this time.   During last hospitalization PICC had to be removed when patient decided to leave AMA.   Maryln Manuel MD   How to contact the [facility] Attending or Consulting provider 7A - 7P or covering provider during after hours 7P -7A, for this patient?  1. Check the care team in [facility] and look for a) attending/consulting [facility] provider listed and b) the [facility] team listed 2. Log into [email or web address] and use 's universal password to access. If you do not have the password, please contact the hospital operator. 3. Locate the [facility] provider you are looking for under [facility] and page to a number that you can be directly reached. 4. If you still have difficulty reaching the provider, please page  the [facility] (Director on Call) for the Hospitalists listed on amion for assistance.

## 2019-10-08 NOTE — H&P (Signed)
TRH H&P    Patient Demographics:    Howard Diaz, is a 34 y.o. male  MRN: [id number]  DOB - 1986/02/11  Admit Date - 10/07/2019  Referring MD/NP/PA: Caryl Asp  Outpatient Primary MD for the patient is Patient, No Pcp Per  Patient coming from: Home  Chief complaint- Back pain   HPI:    Howard Diaz  is a 34 y.o. male, with history of jaundice, IV drug use, hepatitis C, cirrhosis of liver, alcohol abuse presents to the ED with a chief complaint of back pain.  Patient reports that he has severe pain in his back at the upper lumbar and lower Thoracics.  It started this morning and was sudden onset.  He reports that when he woke up his air mattress was flat and when he tried to get up from there he felt sudden pain in his back.  He does not report feeling anything pulling or popping.  He reports "my back is broke."  He describes the pain as aching and sharp.  It is worse with rolling his first head.  He reports urinary incontinence, no bowel incontinence.  His last bowel movement was yesterday.  He has had no saddle anesthesia.  Patient drinks 2 bootleggers per night, smokes half a pack a day, uses marijuana every night, uses meth once a week.  Patient reports that he feels very similar to when he had osteomyelitis of the spine and discitis in 2019.  Patient states that he is in pain repeatedly, but is falling asleep between sentences, in between writing on the bed.  ED course Temperature 98.4, heart rate 79, respiratory 17-20, blood pressure 127/75, 99% Leukocytosis with 13.7 white blood cell count CHEM panel is grossly unremarkable with a hyperglycemia of 131 Covid negative Blood cultures pending CT lumbar shows spondylosis L3-L4, central stenosis L3-L4, mild stenosis L4-L5, compressive sequelae L4/L5 and L5-S1 CT thoracic spine shows mild spondylosis no acute or destructive bony lesion     Review of systems:     In addition to the HPI above,  Review of Systems  Constitutional: Positive for chills, fever and malaise/fatigue.  HENT: Negative for congestion, sinus pain and sore throat.   Eyes: Negative for blurred vision, double vision and photophobia.  Respiratory: Positive for shortness of breath. Negative for cough and wheezing.   Cardiovascular: Negative for chest pain, palpitations and leg swelling.  Gastrointestinal: Positive for abdominal pain. Negative for constipation, diarrhea, nausea and vomiting.  Genitourinary: Positive for dysuria. Negative for frequency and urgency.  Musculoskeletal: Negative for myalgias.  Skin: Positive for rash.  Neurological: Positive for weakness and headaches.  Psychiatric/Behavioral: Positive for substance abuse.    All other systems reviewed and are negative.    Past History of the following :    Past Medical History:  Diagnosis Date  . Alcohol abuse   . Back pain   . Cirrhosis of liver ([place])   . Hepatitis C   . IV drug abuse ([place])   . Jaundice       History reviewed. No pertinent surgical  history.    Social History:      Social History   Tobacco Use  . Smoking status: Current Every Day Smoker    Packs/day: 1.00  . Smokeless tobacco: Never Used  Substance Use Topics  . Alcohol use: Not Currently    Alcohol/week: 14.0 standard drinks    Types: 14 Shots of liquor per week    Comment: 1-2 a day       Family History :    History reviewed. No pertinent family history. Reviewed   Home Medications:   Prior to Admission medications   Medication Sig Start Date End Date Taking? Authorizing Provider  buprenorphine-naloxone (SUBOXONE) 8-2 mg SUBL SL tablet Place 1 tablet under the tongue 3 (three) times daily.    [provider]  cyclobenzaprine (FLEXERIL) 10 MG tablet Take 10 mg by mouth every 8 (eight) hours. 12/11/17   [provider]  lidocaine (LIDODERM) 5 % Place 1 patch onto the skin daily. Remove & Discard  patch within 12 hours or as directed by MD    [provider]  naproxen (NAPROSYN) 500 MG tablet Take 500 mg by mouth 2 (two) times daily. 12/11/17   [provider]     Allergies:     Allergies  Allergen Reactions  . Penicillins Anaphylaxis    Has patient had a PCN reaction causing immediate rash, facial/tongue/throat swelling, SOB or lightheadedness with hypotension: Yes Has patient had a PCN reaction causing severe rash involving mucus membranes or skin necrosis: Yes Has patient had a PCN reaction that required hospitalization:Yes Has patient had a PCN reaction occurring within the last 10 years: No If all of the above answers are "NO", then may proceed with Cephalosporin use.      Physical Exam:   Vitals  Blood pressure 126/68, pulse 96, temperature 98.9 F (37.2 C), temperature source Oral, resp. rate (!) 22, height 6' (1.829 m), weight 90.7 kg, SpO2 94 %.  1.  General: Asleep upon entry, after patient wakes his writhing in the bed, but falling asleep in between  2. Psychiatric: Oriented x3  3. Neurologic: Cranial nerves II through XII are intact, moves all 4 extremities voluntarily, no saddle anesthesia, no asymmetrical numbness or weakness  4. HEENMT:  Head is atraumatic, normocephalic, pupils are reactive to light, neck is supple, trachea is midline, nothing history membranes are moist  5. Respiratory : Lungs are clear to auscultation bilaterally  6. Cardiovascular : Heart rate and rhythm are regular, no murmurs rubs or gallops  7. Gastrointestinal:  Abdomen is soft, nondistended, nontender to palpation Skin peeling on lower right quadrant abdomen no erythema  8. Skin:  Excoriations and picking consistent with meth use  9.Musculoskeletal:  No acute deformity    Data Review:    CBC Recent Labs  Lab 10/07/19 2018  WBC 13.7*  HGB 13.6  HCT 38.7*  PLT 231  MCV 88.6  MCH 31.1  MCHC 35.1  RDW 11.4*  LYMPHSABS 1.3  MONOABS 1.4*   EOSABS 0.0  BASOSABS 0.0   ------------------------------------------------------------------------------------------------------------------  Results for orders placed or performed during the hospital encounter of 10/07/19 (from the past 48 hour(s))  Urinalysis, Routine w reflex microscopic Urine, Clean Catch     Status: Abnormal   Collection Time: 10/07/19  6:52 PM  Result Value Ref Range   Color, Urine YELLOW YELLOW   APPearance CLEAR CLEAR   Specific Gravity, Urine 1.012 1.005 - 1.030   pH 9.0 (H) 5.0 - 8.0   Glucose,  UA NEGATIVE NEGATIVE mg/dL   Hgb urine dipstick NEGATIVE NEGATIVE   Bilirubin Urine NEGATIVE NEGATIVE   Ketones, ur NEGATIVE NEGATIVE mg/dL   Protein, ur NEGATIVE NEGATIVE mg/dL   Nitrite NEGATIVE NEGATIVE   Leukocytes,Ua NEGATIVE NEGATIVE    Comment: Performed at [facility], [address] [place], [place] [address]  Urine rapid drug screen (hosp performed)     Status: Abnormal   Collection Time: 10/07/19  6:52 PM  Result Value Ref Range   Opiates NONE DETECTED NONE DETECTED   Cocaine NONE DETECTED NONE DETECTED   Benzodiazepines NONE DETECTED NONE DETECTED   Amphetamines POSITIVE (A) NONE DETECTED   Tetrahydrocannabinol POSITIVE (A) NONE DETECTED   Barbiturates NONE DETECTED NONE DETECTED    Comment: (NOTE) DRUG SCREEN FOR MEDICAL PURPOSES ONLY.  IF CONFIRMATION IS NEEDED FOR ANY PURPOSE, NOTIFY LAB WITHIN 5 DAYS.  LOWEST DETECTABLE LIMITS FOR URINE DRUG SCREEN Drug Class                     Cutoff (ng/mL) Amphetamine and metabolites    1000 Barbiturate and metabolites    200 Benzodiazepine                 616 Tricyclics and metabolites     300 Opiates and metabolites        300 Cocaine and metabolites        300 THC                            50 Performed at [facility], [address], [place], [place] [address]   SARS Coronavirus 2 by RT PCR (hospital order, performed in [facility] hospital lab) Nasopharyngeal Nasopharyngeal Swab     Status:  None   Collection Time: 10/07/19  6:53 PM   Specimen: Nasopharyngeal Swab  Result Value Ref Range   SARS Coronavirus 2 NEGATIVE NEGATIVE    Comment: (NOTE) SARS-CoV-2 target nucleic acids are NOT DETECTED.  The SARS-CoV-2 RNA is generally detectable in upper and lower respiratory specimens during the acute phase of infection. The lowest concentration of SARS-CoV-2 viral copies this assay can detect is 250 copies / mL. A negative result does not preclude SARS-CoV-2 infection and should not be used as the sole basis for treatment or other patient management decisions.  A negative result may occur with improper specimen collection / handling, submission of specimen other than nasopharyngeal swab, presence of viral mutation(s) within the areas targeted by this assay, and inadequate number of viral copies (<250 copies / mL). A negative result must be combined with clinical observations, patient history, and epidemiological information.  Fact Sheet for Patients:   StrictlyIdeas.no  Fact Sheet for Healthcare Providers: BankingDealers.co.za  This test is not yet approved or  cleared by the [place] FDA and has been authorized for detection and/or diagnosis of SARS-CoV-2 by FDA under an Emergency Use Authorization (EUA).  This EUA will remain in effect (meaning this test can be used) for the duration of the COVID-19 declaration under Section 564(b)(1) of the Act, 21 U.S.C. section 360bbb-3(b)(1), unless the authorization is terminated or revoked sooner.  Performed at [facility] [address], [place], [place] [address]   Comprehensive metabolic panel     Status: Abnormal   Collection Time: 10/07/19  8:18 PM  Result Value Ref Range   Sodium 133 (L) 135 - 145 mmol/L   Potassium 3.6 3.5 - 5.1 mmol/L   Chloride  100 98 - 111 mmol/L   CO2 23 22 - 32 mmol/L   Glucose, Bld 131 (H) 70 - 99 mg/dL    Comment: Glucose reference range  applies only to samples taken after fasting for at least 8 hours.   BUN 8 6 - 20 mg/dL   Creatinine, Ser 0.67 0.61 - 1.24 mg/dL   Calcium 8.6 (L) 8.9 - 10.3 mg/dL   Total Protein 7.3 6.5 - 8.1 g/dL   Albumin 3.9 3.5 - 5.0 g/dL   AST 19 15 - 41 U/L   ALT 17 0 - 44 U/L   Alkaline Phosphatase 63 38 - 126 U/L   Total Bilirubin 0.7 0.3 - 1.2 mg/dL   GFR calc non Af Amer >60 >60 mL/min   GFR calc Af Amer >60 >60 mL/min   Anion gap 10 5 - 15    Comment: Performed at [facility], [address], [place], [place] [address]  Ethanol     Status: None   Collection Time: 10/07/19  8:18 PM  Result Value Ref Range   Alcohol, Ethyl (B) <10 <10 mg/dL    Comment: (NOTE) Lowest detectable limit for serum alcohol is 10 mg/dL.  For medical purposes only. Performed at [facility], [address], [place], [place] [address]   Protime-INR     Status: None   Collection Time: 10/07/19  8:18 PM  Result Value Ref Range   Prothrombin Time 13.5 11.4 - 15.2 seconds   INR 1.1 0.8 - 1.2    Comment: (NOTE) INR goal varies based on device and disease states. Performed at [facility], [address], [place], [place] [address]   CBC with Differential     Status: Abnormal   Collection Time: 10/07/19  8:18 PM  Result Value Ref Range   WBC 13.7 (H) 4.0 - 10.5 K/uL   RBC 4.37 4.22 - 5.81 MIL/uL   Hemoglobin 13.6 13.0 - 17.0 g/dL   HCT 38.7 (L) 39 - 52 %   MCV 88.6 80.0 - 100.0 fL   MCH 31.1 26.0 - 34.0 pg   MCHC 35.1 30.0 - 36.0 g/dL   RDW 11.4 (L) 11.5 - 15.5 %   Platelets 231 150 - 400 K/uL   nRBC 0.0 0.0 - 0.2 %   Neutrophils Relative % 79 %   Neutro Abs 10.8 (H) 1.7 - 7.7 K/uL   Lymphocytes Relative 10 %   Lymphs Abs 1.3 0.7 - 4.0 K/uL   Monocytes Relative 11 %   Monocytes Absolute 1.4 (H) 0 - 1 K/uL   Eosinophils Relative 0 %   Eosinophils Absolute 0.0 0 - 0 K/uL   Basophils Relative 0 %   Basophils Absolute 0.0 0 - 0 K/uL   Immature Granulocytes 0 %   Abs Immature Granulocytes 0.04 0.00 - 0.07  K/uL    Comment: Performed at [facility], [address], [place], [place] [address]  Lactic acid, plasma     Status: None   Collection Time: 10/07/19  8:18 PM  Result Value Ref Range   Lactic Acid, Venous 1.1 0.5 - 1.9 mmol/L    Comment: Performed at [facility], [address], [place],  [address]  Troponin I (High Sensitivity)     Status: None   Collection Time: 10/07/19  8:18 PM  Result Value Ref Range   Troponin I (High Sensitivity) <2 <18 ng/L    Comment: (NOTE) Elevated high sensitivity troponin I (hsTnI) values and significant  changes across serial  measurements may suggest ACS but many other  chronic and acute conditions are known to elevate hsTnI results.  Refer to the "Links" section for chest pain algorithms and additional  guidance. Performed at [facility], [address], [place], [place] [address]   C-reactive protein     Status: Abnormal   Collection Time: 10/07/19  8:18 PM  Result Value Ref Range   CRP 8.3 (H) <1.0 mg/dL    Comment: Performed at [facility], [address], [place], [place] [address]  Sedimentation rate     Status: None   Collection Time: 10/07/19  8:18 PM  Result Value Ref Range   Sed Rate 8 0 - 16 mm/hr    Comment: Performed at [facility], [address] [place], [place] [address]  Lactic acid, plasma     Status: None   Collection Time: 10/07/19 10:54 PM  Result Value Ref Range   Lactic Acid, Venous 0.8 0.5 - 1.9 mmol/L    Comment: Performed at [facility], [address], [place], [place] [address]  Troponin I (High Sensitivity)     Status: None   Collection Time: 10/07/19 10:54 PM  Result Value Ref Range   Troponin I (High Sensitivity) <2 <18 ng/L    Comment: (NOTE) Elevated high sensitivity troponin I (hsTnI) values and significant  changes across serial measurements may suggest ACS but many other  chronic and acute conditions are known to elevate hsTnI results.  Refer to the "Links" section for chest pain algorithms and  additional  guidance. Performed at [facility], [address], [place], [place] [address]     Chemistries  Recent Labs  Lab 10/07/19 2018  NA 133*  K 3.6  CL 100  CO2 23  GLUCOSE 131*  BUN 8  CREATININE 0.67  CALCIUM 8.6*  AST 19  ALT 17  ALKPHOS 63  BILITOT 0.7   ------------------------------------------------------------------------------------------------------------------  ------------------------------------------------------------------------------------------------------------------ GFR: Estimated Creatinine Clearance: 141.5 mL/min (by C-G formula based on SCr of 0.67 mg/dL). Liver Function Tests: Recent Labs  Lab 10/07/19 2018  AST 19  ALT 17  ALKPHOS 63  BILITOT 0.7  PROT 7.3  ALBUMIN 3.9   No results for input(s): LIPASE, AMYLASE in the last 168 hours. No results for input(s): AMMONIA in the last 168 hours. Coagulation Profile: Recent Labs  Lab 10/07/19 2018  INR 1.1   Cardiac Enzymes: No results for input(s): CKTOTAL, CKMB, CKMBINDEX, TROPONINI in the last 168 hours. BNP (last 3 results) No results for input(s): PROBNP in the last 8760 hours. HbA1C: No results for input(s): HGBA1C in the last 72 hours. CBG: No results for input(s): GLUCAP in the last 168 hours. Lipid Profile: No results for input(s): CHOL, HDL, LDLCALC, TRIG, CHOLHDL, LDLDIRECT in the last 72 hours. Thyroid Function Tests: No results for input(s): TSH, T4TOTAL, FREET4, T3FREE, THYROIDAB in the last 72 hours. Anemia Panel: No results for input(s): VITAMINB12, FOLATE, FERRITIN, TIBC, IRON, RETICCTPCT in the last 72 hours.  --------------------------------------------------------------------------------------------------------------- Urine analysis:    Component Value Date/Time   COLORURINE YELLOW 10/07/2019 1852   APPEARANCEUR CLEAR 10/07/2019 1852   LABSPEC 1.012 10/07/2019 1852   PHURINE 9.0 (H) 10/07/2019 1852   GLUCOSEU NEGATIVE 10/07/2019 1852   HGBUR NEGATIVE  10/07/2019 1852   BILIRUBINUR NEGATIVE 10/07/2019 1852   KETONESUR NEGATIVE 10/07/2019 1852   PROTEINUR NEGATIVE 10/07/2019 1852   NITRITE NEGATIVE 10/07/2019 1852   LEUKOCYTESUR NEGATIVE 10/07/2019 1852      Imaging Results:    CT THORACIC SPINE W  CONTRAST  Result Date: 10/07/2019 CLINICAL DATA:  Low back pain that began this morning, elevated white blood cell count, staph infection EXAM: CT THORACIC AND LUMBAR SPINE WITH CONTRAST TECHNIQUE: Multidetector CT imaging of the thoracic and lumbar spine was performed with contrast. Multiplanar CT image reconstructions were also generated. CONTRAST:  100 cc Omnipaque 300 COMPARISON:  None. FINDINGS: CT THORACIC SPINE FINDINGS Alignment: Alignment is anatomic. Vertebrae: No acute fracture or focal pathologic process. Paraspinal and other soft tissues: Paraspinal soft tissues are unremarkable. Minimal atelectasis at the lung bases. Visualized intra-abdominal structures are unremarkable. Disc levels: There is minimal spondylosis at the cervicothoracic junction and within the lower thoracic spine. No compressive sequela. CT LUMBAR SPINE FINDINGS Segmentation: 5 lumbar type vertebrae. Alignment: Alignment is anatomic. Vertebrae: Marked degenerative endplate changes are seen surrounding the L3/L4 disc space. There are bilateral pars defects of L3 without spondylolisthesis. There are no acute displaced fractures. Paraspinal and other soft tissues: Paraspinal soft tissues are unremarkable. Visualized retroperitoneal structures are normal. Disc levels: There is severe spondylosis at L3/L4 with moderate central canal stenosis. There is bilateral neural foraminal narrowing. At L4/L5 there is a left paracentral disc protrusion with bilateral ligamentum flavum hypertrophy resulting in mild-to-moderate central canal stenosis. There is mild symmetrical neural foraminal encroachment. At L5/S1 there is mild left paracentral disc bulge with left lateral recess and neural  foraminal encroachment. IMPRESSION: CT THORACIC SPINE IMPRESSION 1. Mild spondylosis.  No acute or destructive bony lesion. CT LUMBAR SPINE IMPRESSION 1. Lower lumbar spondylosis most pronounced at L3/L4. Moderate central stenosis at L3/L4, with mild stenosis at L4/L5. Left predominant compressive sequela at L4/L5 and L5/S1. 2. No acute or destructive bony lesion. Electronically Signed   By: Randa Ngo M.D.   On: 10/07/2019 22:48   CT LUMBAR SPINE W CONTRAST  Result Date: 10/07/2019 CLINICAL DATA:  Low back pain that began this morning, elevated white blood cell count, staph infection EXAM: CT THORACIC AND LUMBAR SPINE WITH CONTRAST TECHNIQUE: Multidetector CT imaging of the thoracic and lumbar spine was performed with contrast. Multiplanar CT image reconstructions were also generated. CONTRAST:  100 cc Omnipaque 300 COMPARISON:  None. FINDINGS: CT THORACIC SPINE FINDINGS Alignment: Alignment is anatomic. Vertebrae: No acute fracture or focal pathologic process. Paraspinal and other soft tissues: Paraspinal soft tissues are unremarkable. Minimal atelectasis at the lung bases. Visualized intra-abdominal structures are unremarkable. Disc levels: There is minimal spondylosis at the cervicothoracic junction and within the lower thoracic spine. No compressive sequela. CT LUMBAR SPINE FINDINGS Segmentation: 5 lumbar type vertebrae. Alignment: Alignment is anatomic. Vertebrae: Marked degenerative endplate changes are seen surrounding the L3/L4 disc space. There are bilateral pars defects of L3 without spondylolisthesis. There are no acute displaced fractures. Paraspinal and other soft tissues: Paraspinal soft tissues are unremarkable. Visualized retroperitoneal structures are normal. Disc levels: There is severe spondylosis at L3/L4 with moderate central canal stenosis. There is bilateral neural foraminal narrowing. At L4/L5 there is a left paracentral disc protrusion with bilateral ligamentum flavum hypertrophy  resulting in mild-to-moderate central canal stenosis. There is mild symmetrical neural foraminal encroachment. At L5/S1 there is mild left paracentral disc bulge with left lateral recess and neural foraminal encroachment. IMPRESSION: CT THORACIC SPINE IMPRESSION 1. Mild spondylosis.  No acute or destructive bony lesion. CT LUMBAR SPINE IMPRESSION 1. Lower lumbar spondylosis most pronounced at L3/L4. Moderate central stenosis at L3/L4, with mild stenosis at L4/L5. Left predominant compressive sequela at L4/L5 and L5/S1. 2. No acute or destructive bony lesion. Electronically Signed  By: Randa Ngo M.D.   On: 10/07/2019 22:48    My personal review of EKG: Rhythm NSR, Rate 78 /min, QTc 455 ,no Acute ST changes   Assessment & Plan:    Active Problems:   Intractable back pain   1. Intractable back pain 1. Likely muscle spasm 2. Robaxin did not relieve 3. Hx of IVDU, with leukocytosis - abscess to be ruled out with MRI in the AM 4. ESR 8, CRP 8.3 5. CT Lumbar and thoracic spine shows stenosis and spondylosis, without acute changes.  6. Pain control with pain scale 7. Continue to monitor 8. May require transfer to [facility] if there are acute changes on MRI 9. Will likely need outpatient follow up with spine.  2. Leukocytosis 1. Rule out abscess with MRI 2. A febrile 3. Continue to monitor for signs of infection 4. UA negative 5. Blood cultures pending 6. Covid negative 7.     DVT Prophylaxis-   Heparin - SCDs   AM Labs Ordered, also please review Full Orders  Family Communication: Admission, patients condition and plan of care including tests being ordered have been discussed with the patient and Mother who indicate understanding and agree with the plan and Code Status.  Code Status:  Full  Admission status: Observation Time spent in minutes : [place]

## 2019-10-09 ENCOUNTER — Observation Stay (HOSPITAL_COMMUNITY): Payer: Medicaid - Out of State

## 2019-10-09 DIAGNOSIS — M48061 Spinal stenosis, lumbar region without neurogenic claudication: Secondary | ICD-10-CM | POA: Diagnosis present

## 2019-10-09 DIAGNOSIS — F1510 Other stimulant abuse, uncomplicated: Secondary | ICD-10-CM | POA: Diagnosis present

## 2019-10-09 DIAGNOSIS — K7030 Alcoholic cirrhosis of liver without ascites: Secondary | ICD-10-CM | POA: Diagnosis present

## 2019-10-09 DIAGNOSIS — M4646 Discitis, unspecified, lumbar region: Secondary | ICD-10-CM | POA: Diagnosis present

## 2019-10-09 DIAGNOSIS — M4626 Osteomyelitis of vertebra, lumbar region: Secondary | ICD-10-CM | POA: Diagnosis present

## 2019-10-09 DIAGNOSIS — M47816 Spondylosis without myelopathy or radiculopathy, lumbar region: Secondary | ICD-10-CM | POA: Diagnosis present

## 2019-10-09 DIAGNOSIS — R739 Hyperglycemia, unspecified: Secondary | ICD-10-CM | POA: Diagnosis present

## 2019-10-09 DIAGNOSIS — M62838 Other muscle spasm: Secondary | ICD-10-CM | POA: Diagnosis present

## 2019-10-09 DIAGNOSIS — I361 Nonrheumatic tricuspid (valve) insufficiency: Secondary | ICD-10-CM

## 2019-10-09 DIAGNOSIS — I330 Acute and subacute infective endocarditis: Secondary | ICD-10-CM | POA: Diagnosis present

## 2019-10-09 DIAGNOSIS — M4628 Osteomyelitis of vertebra, sacral and sacrococcygeal region: Secondary | ICD-10-CM | POA: Diagnosis not present

## 2019-10-09 DIAGNOSIS — Z9119 Patient's noncompliance with other medical treatment and regimen: Secondary | ICD-10-CM | POA: Diagnosis not present

## 2019-10-09 DIAGNOSIS — R7881 Bacteremia: Secondary | ICD-10-CM | POA: Diagnosis present

## 2019-10-09 DIAGNOSIS — M545 Low back pain: Secondary | ICD-10-CM | POA: Diagnosis not present

## 2019-10-09 DIAGNOSIS — L089 Local infection of the skin and subcutaneous tissue, unspecified: Secondary | ICD-10-CM | POA: Diagnosis present

## 2019-10-09 DIAGNOSIS — M549 Dorsalgia, unspecified: Secondary | ICD-10-CM | POA: Diagnosis present

## 2019-10-09 DIAGNOSIS — F1990 Other psychoactive substance use, unspecified, uncomplicated: Secondary | ICD-10-CM | POA: Diagnosis not present

## 2019-10-09 DIAGNOSIS — Z880 Allergy status to penicillin: Secondary | ICD-10-CM | POA: Diagnosis not present

## 2019-10-09 DIAGNOSIS — E43 Unspecified severe protein-calorie malnutrition: Secondary | ICD-10-CM | POA: Diagnosis present

## 2019-10-09 DIAGNOSIS — B9689 Other specified bacterial agents as the cause of diseases classified elsewhere: Secondary | ICD-10-CM | POA: Diagnosis present

## 2019-10-09 DIAGNOSIS — F1020 Alcohol dependence, uncomplicated: Secondary | ICD-10-CM | POA: Diagnosis present

## 2019-10-09 DIAGNOSIS — Z20822 Contact with and (suspected) exposure to covid-19: Secondary | ICD-10-CM | POA: Diagnosis present

## 2019-10-09 DIAGNOSIS — Z6827 Body mass index (BMI) 27.0-27.9, adult: Secondary | ICD-10-CM | POA: Diagnosis not present

## 2019-10-09 DIAGNOSIS — M5459 Other low back pain: Secondary | ICD-10-CM | POA: Diagnosis present

## 2019-10-09 DIAGNOSIS — F17210 Nicotine dependence, cigarettes, uncomplicated: Secondary | ICD-10-CM | POA: Diagnosis present

## 2019-10-09 DIAGNOSIS — B182 Chronic viral hepatitis C: Secondary | ICD-10-CM | POA: Diagnosis not present

## 2019-10-09 DIAGNOSIS — B1920 Unspecified viral hepatitis C without hepatic coma: Secondary | ICD-10-CM | POA: Diagnosis present

## 2019-10-09 DIAGNOSIS — B9561 Methicillin susceptible Staphylococcus aureus infection as the cause of diseases classified elsewhere: Secondary | ICD-10-CM | POA: Diagnosis present

## 2019-10-09 LAB — ECHOCARDIOGRAM COMPLETE
Area-P 1/2: 2.91 cm2
Height: 72 in
S' Lateral: 2.59 cm
Weight: 3132.3 oz

## 2019-10-09 MED ORDER — ACETAMINOPHEN 325 MG PO TABS
650.0000 mg | ORAL_TABLET | Freq: Four times a day (QID) | ORAL | Status: DC | PRN
  Administered 2019-10-09: 650 mg via ORAL
  Filled 2019-10-09: qty 2

## 2019-10-09 MED ORDER — SENNOSIDES-DOCUSATE SODIUM 8.6-50 MG PO TABS: 2.0000 | ORAL_TABLET | Freq: Every evening | ORAL | Status: DC | PRN

## 2019-10-09 MED ORDER — POLYETHYLENE GLYCOL 3350 17 G PO PACK: 17.0000 g | PACK | Freq: Every day | ORAL | Status: DC | PRN

## 2019-10-09 NOTE — Progress Notes (Signed)
PROGRESS NOTE    Howard Diaz  ZOX:[id number] DOB: [birth year] DOA: 10/07/2019 PCP: Patient, No Pcp Per   Brief Narrative:  34 y.o. male, with history of jaundice, IV drug use, hepatitis C, cirrhosis of liver, alcohol abuse presents to the ED with a chief complaint of back pain.  Patient reports that he has severe pain in his back at the upper lumbar and lower Thoracics.   MRI spine showed chronic discitis/osteomyelitis but no abscess. Previously he has left AMA with incomplete treatment and poor follow-up. Previous provider discussed case with infectious disease who recommends inpatient treatment with IV antibiotics.   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 Gram-positive bacteremia. -Sepsis evidenced by leukocytosis, fever and tachycardia on presentation. Source is his lumbar spine -Initial blood cultures positive for gram-positive cocci, speciation and sensitivities pending -Surveillance cultures-pending -Echocardiogram-pending -Previous provider discussed case with ID-recommending inpatient IV antibiotics for at least 4 weeks once bacteremia clears. Can get oritavancin as a last resort if he decides to leave AMA. All IV lines will be discontinued if he leaves.  Other option is, once his bacteremia clears he can get 4 weeks of linezolid but will need outpatient lab monitoring. I have discussed the case today with Dr. Daiva Eves today, appreciate his input. Also spoke  with the pharmacy.  -IV cefazolin-planned last day 11/05/2019   Hepatitis C and alcohol abuse with liver cirrhosis -Counseled to quit using this. Monitor for any alcohol withdrawal symptoms. -Multivitamin, folic acid, thiamine -Alcohol withdrawal protocol. -Monitor for any encephalopathy or liver decompensation issues    DVT prophylaxis: Subcutaneous heparin Code Status: Full code Family Communication:     Dispo: The patient is from: Home              Anticipated d/c is to: Home              Anticipated d/c date is: > 3 days              Patient currently is not medically stable to d/c. Due to medication noncompliance and unsafe discharge with IV PICC line at home he will remain in the hospital for month course of IV antibiotics      Body mass index is 26.55 kg/m.      Subjective: Patient reporting of back pain asking for pain medications.  I explained to him that he can get oral pain medications to help control his pain and expect that to improve with improvement in his infection.  Review of Systems Otherwise negative except as per HPI, including: General: Denies fever, chills, night sweats or unintended weight loss. Resp: Denies cough, wheezing, shortness of breath. Cardiac: Denies chest pain, palpitations, orthopnea, paroxysmal nocturnal dyspnea. GI: Denies abdominal pain, nausea, vomiting, diarrhea or constipation GU: Denies dysuria, frequency, hesitancy or incontinence MS: Denies muscle aches, joint pain or swelling Neuro: Denies headache, neurologic deficits (focal weakness, numbness, tingling), abnormal gait Psych: Denies anxiety, depression, SI/HI/AVH Skin: Denies new rashes or lesions ID: Denies sick  contacts, exotic exposures, travel  Examination:  Constitutional: Not in acute distress Respiratory: Clear to auscultation bilaterally Cardiovascular: Normal sinus rhythm, no rubs Abdomen: Nontender nondistended good bowel sounds Musculoskeletal: No edema  noted Skin: Multiple skin excoriations Neurologic: CN 2-12 grossly intact.  And nonfocal Psychiatric: Normal judgment and insight. Alert and oriented x 3. Normal mood.   Objective: Vitals:   10/08/19 1954 10/09/19 0017 10/09/19 0217 10/09/19 0607  BP:  115/67 (!) 109/44 113/70  Pulse:  (!) 107 (!) 102 85  Resp:  16 16 17   Temp:  (!) 103.2 F (39.6 C) (!) 102.3 F (39.1 C) 98 F (36.7 C)  TempSrc:  Oral  Oral  SpO2: 93% 97% 98% 95%  Weight:      Height:        Intake/Output Summary (Last 24 hours) at 10/09/2019 0814 Last data filed at 10/09/2019 0400 Gross per 24 hour  Intake 680 ml  Output 2100 ml  Net -1420 ml   Filed Weights   10/07/19 1824 10/08/19 0509  Weight: 90.7 kg 88.8 kg     Data Reviewed:   CBC: Recent Labs  Lab 10/07/19 2018 10/08/19 0944  WBC 13.7* 15.0*  NEUTROABS 10.8* 12.2*  HGB 13.6 14.1  HCT 38.7* 40.5  MCV 88.6 89.0  PLT 231 213   Basic Metabolic Panel: Recent Labs  Lab 10/07/19 2018 10/08/19 0944  NA 133* 132*  K 3.6 3.5  CL 100 99  CO2 23 23  GLUCOSE 131* 152*  BUN 8 8  CREATININE 0.67 0.67  CALCIUM 8.6* 8.5*  MG  --  1.8   GFR: Estimated Creatinine Clearance: 141.5 mL/min (by C-G formula based on SCr of 0.67 mg/dL). Liver Function Tests: Recent Labs  Lab 10/07/19 2018 10/08/19 0944  AST 19 16  ALT 17 16  ALKPHOS 63 64  BILITOT 0.7 0.8  PROT 7.3 7.2  ALBUMIN 3.9 3.7   No results for input(s): LIPASE, AMYLASE in the last 168 hours. No results for input(s): AMMONIA in the last 168 hours. Coagulation Profile: Recent Labs  Lab 10/07/19 2018  INR 1.1   Cardiac Enzymes: No results for input(s): CKTOTAL, CKMB, CKMBINDEX, TROPONINI in the last 168 hours. BNP (last 3 results) No results for input(s): PROBNP in the last 8760 hours. HbA1C: No results for input(s): HGBA1C in the last 72 hours. CBG: No results for input(s): GLUCAP in the last 168 hours. Lipid Profile: No results for input(s): CHOL, HDL, LDLCALC,  TRIG, CHOLHDL, LDLDIRECT in the last 72 hours. Thyroid Function Tests: No results for input(s): TSH, T4TOTAL, FREET4, T3FREE, THYROIDAB in the last 72 hours. Anemia Panel: No results for input(s): VITAMINB12, FOLATE, FERRITIN, TIBC, IRON, RETICCTPCT in the last 72 hours. Sepsis Labs: Recent Labs  Lab 10/07/19 2018 10/07/19 2254  LATICACIDVEN 1.1 0.8    Recent Results (from the past 240 hour(s))  SARS Coronavirus 2 by RT PCR (hospital order, performed in [facility] hospital lab) Nasopharyngeal Nasopharyngeal Swab     Status: None   Collection Time: 10/07/19  6:53 PM   Specimen: Nasopharyngeal Swab  Result Value Ref Range Status   SARS Coronavirus 2 NEGATIVE NEGATIVE Final    Comment: (NOTE) SARS-CoV-2 target nucleic acids are NOT DETECTED.  The SARS-CoV-2 RNA is generally detectable in upper and lower respiratory specimens during the acute phase of infection. The lowest concentration of SARS-CoV-2 viral copies this assay can detect is 250 copies / mL. A negative result does not preclude SARS-CoV-2 infection and should not be  used as the sole basis for treatment or other patient management decisions.  A negative result may occur with improper specimen collection / handling, submission of specimen other than nasopharyngeal swab, presence of viral mutation(s) within the areas targeted by this assay, and inadequate number of viral copies (<250 copies / mL). A negative result must be combined with clinical observations, patient history, and epidemiological information.  Fact Sheet for Patients:   BoilerBrush.com.cy  Fact Sheet for Healthcare Providers: https://pope.com/  This test is not yet approved or  cleared by the [place] FDA and has been authorized for detection and/or diagnosis of SARS-CoV-2 by FDA under an Emergency Use Authorization (EUA).  This EUA will remain in effect (meaning this test can be used) for the  duration of the COVID-19 declaration under Section 564(b)(1) of the Act, 21 U.S.C. section 360bbb-3(b)(1), unless the authorization is terminated or revoked sooner.  Performed at [facility], [address], [place], [place] [address]   Culture, blood (routine x 2)     Status: None (Preliminary result)   Collection Time: 10/07/19  8:33 PM   Specimen: BLOOD LEFT FOREARM  Result Value Ref Range Status   Specimen Description   Final    BLOOD LEFT FOREARM Performed at [facility], [address], [place], [place] [address]    Special Requests   Final    BOTTLES DRAWN AEROBIC AND ANAEROBIC Blood Culture adequate volume Performed at [facility], [address] [place], [place] [address]    Culture  Setup Time   Final    ANAEROBIC AND AEROBIC BOTTLES GRAM POSITIVE COCCI Gram Stain Report Called to,Read Back By and Verified With: STYPA,N@1505  BY MATTHEWS, B 8.20.21 [facility] CRITICAL VALUE NOTED.  VALUE IS CONSISTENT WITH PREVIOUSLY REPORTED AND CALLED VALUE. Performed at [facility], [address], [place], [place] [address]    Culture   Final    NO GROWTH 2 DAYS Performed at [facility], [address], [place], [place] [address]    Report Status PENDING  Incomplete  Culture, blood (routine x 2)     Status: None (Preliminary result)   Collection Time: 10/07/19  8:33 PM   Specimen: BLOOD LEFT FOREARM  Result Value Ref Range Status   Specimen Description   Final    BLOOD LEFT FOREARM Performed at [facility], [address] [place], [place] [address]    Special Requests   Final    BOTTLES DRAWN AEROBIC AND ANAEROBIC Blood Culture adequate volume Performed at [facility], [address], [place], [place] [address]    Culture  Setup Time   Final    ANAEROBIC AND AEROBIC BOTTLES GRAM POSITIVE COCCI Gram Stain Report Called to,Read Back By and Verified With: STYPA,N @1505  BY MATTHEWS,B 8.20.21 [facility] Organism ID to follow CRITICAL RESULT CALLED TO,  READ BACK BY AND VERIFIED WITH: B,JOHNSON RN @2029  10/08/19 EB Performed at [facility], [address] [place], [place] [address]    Culture   Final    NO GROWTH 2 DAYS Performed at [facility], [address] [place], [place] [address]    Report Status PENDING  Incomplete  Blood Culture ID Panel (Reflexed)     Status: Abnormal   Collection Time: 10/07/19  8:33 PM  Result Value Ref Range Status   Enterococcus faecalis NOT DETECTED NOT DETECTED Final   Enterococcus Faecium NOT DETECTED NOT DETECTED Final   Listeria monocytogenes NOT DETECTED NOT DETECTED Final  Staphylococcus species DETECTED (A) NOT DETECTED Final    Comment: RESULT CALLED TO, READ BACK BY AND VERIFIED WITH: B,JOHNSON RN  10/08/19 EB    Staphylococcus aureus (BCID) DETECTED (A) NOT DETECTED Final    Comment: RESULT CALLED TO, READ BACK BY AND VERIFIED WITH: B,JOHNSON RN  10/08/19 EB    Staphylococcus epidermidis NOT DETECTED NOT DETECTED Final   Staphylococcus lugdunensis NOT DETECTED NOT DETECTED Final   Streptococcus species NOT DETECTED NOT DETECTED Final   Streptococcus agalactiae NOT DETECTED NOT DETECTED Final   Streptococcus pneumoniae NOT DETECTED NOT DETECTED Final   Streptococcus pyogenes NOT DETECTED NOT DETECTED Final   A.calcoaceticus-baumannii NOT DETECTED NOT DETECTED Final   Bacteroides fragilis NOT DETECTED NOT DETECTED Final   Enterobacterales NOT DETECTED NOT DETECTED Final   Enterobacter cloacae complex NOT DETECTED NOT DETECTED Final   Escherichia coli NOT DETECTED NOT DETECTED Final   Klebsiella aerogenes NOT DETECTED NOT DETECTED Final   Klebsiella oxytoca NOT DETECTED NOT DETECTED Final   Klebsiella pneumoniae NOT DETECTED NOT DETECTED Final   Proteus species NOT DETECTED NOT DETECTED Final   Salmonella species NOT DETECTED NOT DETECTED Final   Serratia marcescens NOT DETECTED NOT DETECTED Final   Haemophilus influenzae NOT DETECTED NOT DETECTED Final   Neisseria  meningitidis NOT DETECTED NOT DETECTED Final   Pseudomonas aeruginosa NOT DETECTED NOT DETECTED Final   Stenotrophomonas maltophilia NOT DETECTED NOT DETECTED Final   Candida albicans NOT DETECTED NOT DETECTED Final   Candida auris NOT DETECTED NOT DETECTED Final   Candida glabrata NOT DETECTED NOT DETECTED Final   Candida krusei NOT DETECTED NOT DETECTED Final   Candida parapsilosis NOT DETECTED NOT DETECTED Final   Candida tropicalis NOT DETECTED NOT DETECTED Final   Cryptococcus neoformans/gattii NOT DETECTED NOT DETECTED Final   Meth resistant mecA/C and MREJ NOT DETECTED NOT DETECTED Final    Comment: RESULT CALLED TO, READ BACK BY AND VERIFIED WITH: B,JOHNSON RN  10/08/19 EB Performed at [facility], [address] [place], [place] [address]   Culture, blood (Routine X 2) w Reflex to ID Panel     Status: None (Preliminary result)   Collection Time: 10/08/19  9:17 PM   Specimen: BLOOD LEFT FOREARM  Result Value Ref Range Status   Specimen Description BLOOD LEFT FOREARM  Final   Special Requests   Final    BOTTLES DRAWN AEROBIC AND ANAEROBIC Blood Culture adequate volume   Culture   Final    NO GROWTH < 12 HOURS Performed at [facility], [address], [place], [place] [address]    Report Status PENDING  Incomplete  Culture, blood (Routine X 2) w Reflex to ID Panel     Status: None (Preliminary result)   Collection Time: 10/08/19  9:17 PM   Specimen: BLOOD LEFT FOREARM  Result Value Ref Range Status   Specimen Description BLOOD LEFT FOREARM  Final   Special Requests   Final    BOTTLES DRAWN AEROBIC AND ANAEROBIC Blood Culture adequate volume   Culture   Final    NO GROWTH < 12 HOURS Performed at [facility], [address], [place], [place] [address]    Report Status PENDING  Incomplete         Radiology Studies: CT THORACIC SPINE W CONTRAST  Result Date: 10/07/2019 CLINICAL DATA:  Low back pain that began this morning, elevated white blood cell count,  staph infection EXAM: CT THORACIC AND LUMBAR SPINE WITH CONTRAST TECHNIQUE: Multidetector CT imaging  of the thoracic and lumbar spine was performed with contrast. Multiplanar CT image reconstructions were also generated. CONTRAST:  100 cc Omnipaque 300 COMPARISON:  None. FINDINGS: CT THORACIC SPINE FINDINGS Alignment: Alignment is anatomic. Vertebrae: No acute fracture or focal pathologic process. Paraspinal and other soft tissues: Paraspinal soft tissues are unremarkable. Minimal atelectasis at the lung bases. Visualized intra-abdominal structures are unremarkable. Disc levels: There is minimal spondylosis at the cervicothoracic junction and within the lower thoracic spine. No compressive sequela. CT LUMBAR SPINE FINDINGS Segmentation: 5 lumbar type vertebrae. Alignment: Alignment is anatomic. Vertebrae: Marked degenerative endplate changes are seen surrounding the L3/L4 disc space. There are bilateral pars defects of L3 without spondylolisthesis. There are no acute displaced fractures. Paraspinal and other soft tissues: Paraspinal soft tissues are unremarkable. Visualized retroperitoneal structures are normal. Disc levels: There is severe spondylosis at L3/L4 with moderate central canal stenosis. There is bilateral neural foraminal narrowing. At L4/L5 there is a left paracentral disc protrusion with bilateral ligamentum flavum hypertrophy resulting in mild-to-moderate central canal stenosis. There is mild symmetrical neural foraminal encroachment. At L5/S1 there is mild left paracentral disc bulge with left lateral recess and neural foraminal encroachment. IMPRESSION: CT THORACIC SPINE IMPRESSION 1. Mild spondylosis.  No acute or destructive bony lesion. CT LUMBAR SPINE IMPRESSION 1. Lower lumbar spondylosis most pronounced at L3/L4. Moderate central stenosis at L3/L4, with mild stenosis at L4/L5. Left predominant compressive sequela at L4/L5 and L5/S1. 2. No acute or destructive bony lesion. Electronically Signed    By: Sharlet Salina M.D.   On: 10/07/2019 22:48   CT LUMBAR SPINE W CONTRAST  Result Date: 10/07/2019 CLINICAL DATA:  Low back pain that began this morning, elevated white blood cell count, staph infection EXAM: CT THORACIC AND LUMBAR SPINE WITH CONTRAST TECHNIQUE: Multidetector CT imaging of the thoracic and lumbar spine was performed with contrast. Multiplanar CT image reconstructions were also generated. CONTRAST:  100 cc Omnipaque 300 COMPARISON:  None. FINDINGS: CT THORACIC SPINE FINDINGS Alignment: Alignment is anatomic. Vertebrae: No acute fracture or focal pathologic process. Paraspinal and other soft tissues: Paraspinal soft tissues are unremarkable. Minimal atelectasis at the lung bases. Visualized intra-abdominal structures are unremarkable. Disc levels: There is minimal spondylosis at the cervicothoracic junction and within the lower thoracic spine. No compressive sequela. CT LUMBAR SPINE FINDINGS Segmentation: 5 lumbar type vertebrae. Alignment: Alignment is anatomic. Vertebrae: Marked degenerative endplate changes are seen surrounding the L3/L4 disc space. There are bilateral pars defects of L3 without spondylolisthesis. There are no acute displaced fractures. Paraspinal and other soft tissues: Paraspinal soft tissues are unremarkable. Visualized retroperitoneal structures are normal. Disc levels: There is severe spondylosis at L3/L4 with moderate central canal stenosis. There is bilateral neural foraminal narrowing. At L4/L5 there is a left paracentral disc protrusion with bilateral ligamentum flavum hypertrophy resulting in mild-to-moderate central canal stenosis. There is mild symmetrical neural foraminal encroachment. At L5/S1 there is mild left paracentral disc bulge with left lateral recess and neural foraminal encroachment. IMPRESSION: CT THORACIC SPINE IMPRESSION 1. Mild spondylosis.  No acute or destructive bony lesion. CT LUMBAR SPINE IMPRESSION 1. Lower lumbar spondylosis most  pronounced at L3/L4. Moderate central stenosis at L3/L4, with mild stenosis at L4/L5. Left predominant compressive sequela at L4/L5 and L5/S1. 2. No acute or destructive bony lesion. Electronically Signed   By: Sharlet Salina M.D.   On: 10/07/2019 22:48   MR THORACIC SPINE W WO CONTRAST  Result Date: 10/08/2019 CLINICAL DATA:  Rule out epidural abscess. History of IV drug abuse.  Elevated white blood count. EXAM: MRI THORACIC WITHOUT AND WITH CONTRAST TECHNIQUE: Multiplanar and multiecho pulse sequences of the thoracic spine were obtained without and with intravenous contrast. CONTRAST:  OMNIPAQUE IOHEXOL 300 MG/ML  SOLN COMPARISON:  CT thoracic spine 10/07/2019 FINDINGS: MRI THORACIC SPINE FINDINGS Alignment:  Normal Vertebrae: Negative for fracture or mass. Normal enhancement of the spine. No evidence of spinal infection in the thoracic spine. Cord: Normal spinal cord signal. No cord lesion or cord compression. Paraspinal and other soft tissues: Bibasilar airspace disease which may represent atelectasis or pneumonia. No pleural effusion. Disc levels: T4-5: Small left-sided disc protrusion T6-7: Moderately large right paracentral disc protrusion with cord flattening on the right. T7-8: Small right-sided disc protrusion with mild cord flattening. T8-9: Small to moderate left-sided disc protrusion with cord flattening on the left. T9-10: Small left-sided disc protrusion. T10-11: Small right-sided disc protrusion. IMPRESSION: No evidence of spinal infection in the thoracic spine. Negative for abscess. Thoracic degenerative changes with multiple disc protrusions as above. Bibasilar atelectasis/infiltrate. Electronically Signed   By: Marlan Palau M.D.   On: 10/08/2019 14:38   MR Lumbar Spine W Wo Contrast  Result Date: 10/08/2019 CLINICAL DATA:  Sudden onset of severe back pain. History of IV drug abuse, cirrhosis, alcohol abuse. History of lumbar spine infection. Elevated white blood count. EXAM: MRI  LUMBAR SPINE WITHOUT AND WITH CONTRAST TECHNIQUE: Multiplanar and multiecho pulse sequences of the lumbar spine were obtained without and with intravenous contrast. CONTRAST:  OMNIPAQUE IOHEXOL 300 MG/ML  SOLN COMPARISON:  CT lumbar spine 10/07/2019.  Lumbar MRI 12/23/2017 FINDINGS: Segmentation:  Normal Alignment:  Slight anterolisthesis L3-4 otherwise normal alignment Vertebrae: Abnormal bone marrow signal at L3-4. Extensive sclerotic changes are noted on CT today. There is also disc space narrowing and endplate irregularity. Small amount of fluid in the disc space. No enhancement of the bone marrow on today's study. This level showed diffuse bone marrow edema and enhancement on the prior MRI consistent with discitis and osteomyelitis. Bilateral pars defects of L3 best seen on the CT. Negative for acute fracture. Conus medullaris and cauda equina: Conus extends to the L1-2 level. Conus and cauda equina appear normal. Paraspinal and other soft tissues: No paraspinous abscess. Mild paraspinous soft tissue thickening around the L3-4 disc space. Disc levels: L1-2: Negative L2-3: Negative L3-4: Findings consistent with chronic discitis and osteomyelitis. No evidence of enhancement today. No abscess. There is diffuse endplate spurring and mild spinal stenosis with mild subarticular stenosis bilaterally. L4-5: Mild disc bulging. Shallow right-sided disc protrusion. Moderate subarticular foraminal stenosis on the right. L5-S1: Mild disc bulging without stenosis. IMPRESSION: Findings compatible with chronic discitis and osteomyelitis at L3-4. Negative for abscess or abnormal bone enhancement on today's study. There is diffuse endplate spurring causing mild spinal stenosis and mild subarticular stenosis bilaterally. Disc degeneration L4-5 with small right-sided disc protrusion. Mild disc bulging L5-S1. Electronically Signed   By: Marlan Palau M.D.   On: 10/08/2019 14:33        Scheduled Meds: . heparin   5,000 Units Subcutaneous Q8H  . lidocaine  1 patch Transdermal Q24H  . LORazepam  0-4 mg Intravenous Q6H   Or  . LORazepam  0-4 mg Oral Q6H  . [START ON 10/10/2019] LORazepam  0-4 mg Intravenous Q12H   Or  . [START ON 10/10/2019] LORazepam  0-4 mg Oral Q12H  . multivitamin with minerals  1 tablet Oral Daily  . saccharomyces boulardii  250 mg Oral BID  . thiamine  100 mg Oral Daily   Or  . thiamine  100 mg Intravenous Daily   Continuous Infusions: .  ceFAZolin (ANCEF) IV 2 g (10/08/19 2342)     LOS: 0 days   Time spent= 35 mins    Kais Monje Joline Maxcyhirag Delmus Warwick, MD [facility]  If 7PM-7AM, please contact night-coverage  10/09/2019, 8:14 AM

## 2019-10-09 NOTE — Progress Notes (Signed)
[facility] lab called to confirm that patient had four positive blood cultures.  Notified mid level at 0020, received no new orders. Also informed mid-level of patient fever, that antibiotics were ordered and other vitals were stable.  Will continue to monitor patient.

## 2019-10-09 NOTE — Consult Note (Signed)
Date of Admission:  10/07/2019          Reason for Consult: MSSA bacteremia   Referring Provider: Alfredo BachVigilanz [facility] consult   Assessment:  1. MSSA bacteremia with 2. Lumbo-sacral osteomyelitis and diskitis 3. IVDU 4. 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 Cefazolin for now 2. If he is unwilling to stay for several weeks would give him a dose of Oritanvancin and rx for 4 weeks of zyvox (ideally actually give him the zyvox) to start after Oritavancin 3. Repeat blood cultures 4. TTE 5. TEE if patient agreeable 6. 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 heparin  5,000 Units Subcutaneous Q8H  . lidocaine  1 patch Transdermal Q24H  . LORazepam  0-4 mg Intravenous Q6H   Or  . LORazepam  0-4 mg Oral Q6H  . [START ON 10/10/2019] LORazepam  0-4 mg Intravenous Q12H   Or  . [START ON 10/10/2019] LORazepam  0-4 mg Oral Q12H  . multivitamin with minerals  1 tablet Oral Daily  . saccharomyces boulardii  250 mg Oral BID  . thiamine  100 mg Oral Daily   Or  . thiamine  100 mg Intravenous Daily   Continuous Infusions: .  ceFAZolin (ANCEF) IV 2 g (10/08/19 2342)   PRN Meds:.acetaminophen, cyclobenzaprine, HYDROcodone-acetaminophen, ketorolac, nicotine, ondansetron **OR** ondansetron (ZOFRAN) IV, polyethylene glycol, senna-docusate  HPI: Howard Diaz is a 34 y.o. male w hx of IVDU, alcoholic cirrhosis, prior MSSA bacteremia and LS osteomyelitis diskitis in 2019 now admitted with severe back pain and found to have MSSAB again. MRI of L-S seems relatively stable.  IF he is willing to stay which  seems unlikely would give him as many weeks of ancef as possible. If he will not then oritavancin x 1 and 4 weeks of zyvox   Review of Systems: Review of Systems  Unable to perform ROS: Other    Past Medical History:  Diagnosis Date  . Alcohol abuse   . Back pain   . Cirrhosis of liver (HCC)   . Hepatitis C   . IV drug abuse (HCC)   . Jaundice     Social History   Tobacco Use  . Smoking status: Current Every Day Smoker    Packs/day: 1.00  . Smokeless tobacco: Never Used  Vaping Use  . Vaping Use: Former  . Substances: Nicotine  Substance Use Topics  . Alcohol use: Not Currently    Alcohol/week: 14.0 standard drinks    Types: 14 Shots of liquor per week    Comment: 1-2 a day  . Drug use: Yes    Frequency: 3.0 times per week    Types: Marijuana, Methamphetamines    Comment: meth use a week ago    History reviewed. No pertinent family history. Allergies  Allergen Reactions  . Penicillins Anaphylaxis    Has patient had a PCN reaction causing immediate rash, facial/tongue/throat swelling, SOB or lightheadedness with hypotension: Yes Has patient had a PCN reaction causing severe rash involving mucus membranes or skin necrosis: Yes Has patient had a PCN  reaction that required hospitalization:Yes Has patient had a PCN reaction occurring within the last 10 years: No If all of the above answers are "NO", then may proceed with Cephalosporin use.     OBJECTIVE: Blood pressure 108/63, pulse 82, temperature 98.4 F (36.9 C), temperature source Oral, resp. rate 17, height 6' (1.829 m), weight 88.8 kg, SpO2 100 %.  Physical Exam Vitals reviewed.     Lab Results Lab Results  Component Value Date   WBC 15.0 (H) 10/08/2019   HGB 14.1 10/08/2019   HCT 40.5 10/08/2019   MCV 89.0 10/08/2019   PLT 213 10/08/2019    Lab Results  Component Value Date   CREATININE 0.67 10/08/2019   BUN 8 10/08/2019   NA 132 (L) 10/08/2019   K 3.5 10/08/2019   CL 99 10/08/2019   CO2 23  10/08/2019    Lab Results  Component Value Date   ALT 16 10/08/2019   AST 16 10/08/2019   ALKPHOS 64 10/08/2019   BILITOT 0.8 10/08/2019     Microbiology: Recent Results (from the past 240 hour(s))  SARS Coronavirus 2 by RT PCR (hospital order, performed in [facility] hospital lab) Nasopharyngeal Nasopharyngeal Swab     Status: None   Collection Time: 10/07/19  6:53 PM   Specimen: Nasopharyngeal Swab  Result Value Ref Range Status   SARS Coronavirus 2 NEGATIVE NEGATIVE Final    Comment: (NOTE) SARS-CoV-2 target nucleic acids are NOT DETECTED.  The SARS-CoV-2 RNA is generally detectable in upper and lower respiratory specimens during the acute phase of infection. The lowest concentration of SARS-CoV-2 viral copies this assay can detect is 250 copies / mL. A negative result does not preclude SARS-CoV-2 infection and should not be used as the sole basis for treatment or other patient management decisions.  A negative result may occur with improper specimen collection / handling, submission of specimen other than nasopharyngeal swab, presence of viral mutation(s) within the areas targeted by this assay, and inadequate number of viral copies (<250 copies / mL). A negative result must be combined with clinical observations, patient history, and epidemiological information.  Fact Sheet for Patients:   BoilerBrush.com.cy  Fact Sheet for Healthcare Providers: https://pope.com/  This test is not yet approved or  cleared by the [place] FDA and has been authorized for detection and/or diagnosis of SARS-CoV-2 by FDA under an Emergency Use Authorization (EUA).  This EUA will remain in effect (meaning this test can be used) for the duration of the COVID-19 declaration under Section 564(b)(1) of the Act, 21 U.S.C. section 360bbb-3(b)(1), unless the authorization is terminated or revoked sooner.  Performed at [facility], [address], [place], [place] [address]   Culture, blood (routine x 2)     Status: Abnormal (Preliminary result)   Collection Time: 10/07/19  8:33 PM   Specimen: BLOOD LEFT FOREARM  Result Value Ref Range Status   Specimen Description   Final    BLOOD LEFT FOREARM Performed at [facility], [address] [place], [place] [address]    Special Requests   Final    BOTTLES DRAWN AEROBIC AND ANAEROBIC Blood Culture adequate volume Performed at [facility], [address], [place], [place] [address]    Culture  Setup Time   Final    ANAEROBIC AND AEROBIC BOTTLES GRAM POSITIVE COCCI Gram Stain Report Called to,Read Back By and Verified With: STYPA,N@1505  BY MATTHEWS, B 8.20.21  HOSP CRITICAL VALUE NOTED.  VALUE IS CONSISTENT WITH PREVIOUSLY REPORTED  AND CALLED VALUE. Performed at [facility], [address], [place], [place] [address]    Culture STAPHYLOCOCCUS AUREUS (A)  Final   Report Status PENDING  Incomplete  Culture, blood (routine x 2)     Status: Abnormal (Preliminary result)   Collection Time: 10/07/19  8:33 PM   Specimen: BLOOD LEFT FOREARM  Result Value Ref Range Status   Specimen Description   Final    BLOOD LEFT FOREARM Performed at [facility], [address] [place], [place] [address]    Special Requests   Final    BOTTLES DRAWN AEROBIC AND ANAEROBIC Blood Culture adequate volume Performed at [facility], [address], [place], [place] [address]    Culture  Setup Time   Final    ANAEROBIC AND AEROBIC BOTTLES GRAM POSITIVE COCCI Gram Stain Report Called to,Read Back By and Verified With: STYPA,N @1505  BY MATTHEWS,B 8.20.21 [facility] Organism ID to follow CRITICAL RESULT CALLED TO, READ BACK BY AND VERIFIED WITH: B,JOHNSON RN @2029  10/08/19 EB Performed at [facility], [address] [place], [address] [place]    Culture STAPHYLOCOCCUS AUREUS (A)  Final   Report Status PENDING  Incomplete  Blood Culture ID Panel (Reflexed)     Status:  Abnormal   Collection Time: 10/07/19  8:33 PM  Result Value Ref Range Status   Enterococcus faecalis NOT DETECTED NOT DETECTED Final   Enterococcus Faecium NOT DETECTED NOT DETECTED Final   Listeria monocytogenes NOT DETECTED NOT DETECTED Final   Staphylococcus species DETECTED (A) NOT DETECTED Final    Comment: RESULT CALLED TO, READ BACK BY AND VERIFIED WITH: B,JOHNSON RN @2029  10/08/19 EB    Staphylococcus aureus (BCID) DETECTED (A) NOT DETECTED Final    Comment: RESULT CALLED TO, READ BACK BY AND VERIFIED WITH: B,JOHNSON RN @2029  10/08/19 EB    Staphylococcus epidermidis NOT DETECTED NOT DETECTED Final   Staphylococcus lugdunensis NOT DETECTED NOT DETECTED Final   Streptococcus species NOT DETECTED NOT DETECTED Final   Streptococcus agalactiae NOT DETECTED NOT DETECTED Final   Streptococcus pneumoniae NOT DETECTED NOT DETECTED Final   Streptococcus pyogenes NOT DETECTED NOT DETECTED Final   A.calcoaceticus-baumannii NOT DETECTED NOT DETECTED Final   Bacteroides fragilis NOT DETECTED NOT DETECTED Final   Enterobacterales NOT DETECTED NOT DETECTED Final   Enterobacter cloacae complex NOT DETECTED NOT DETECTED Final   Escherichia coli NOT DETECTED NOT DETECTED Final   Klebsiella aerogenes NOT DETECTED NOT DETECTED Final   Klebsiella oxytoca NOT DETECTED NOT DETECTED Final   Klebsiella pneumoniae NOT DETECTED NOT DETECTED Final   Proteus species NOT DETECTED NOT DETECTED Final   Salmonella species NOT DETECTED NOT DETECTED Final   Serratia marcescens NOT DETECTED NOT DETECTED Final   Haemophilus influenzae NOT DETECTED NOT DETECTED Final   Neisseria meningitidis NOT DETECTED NOT DETECTED Final   Pseudomonas aeruginosa NOT DETECTED NOT DETECTED Final   Stenotrophomonas maltophilia NOT DETECTED NOT DETECTED Final   Candida albicans NOT DETECTED NOT DETECTED Final   Candida auris NOT DETECTED NOT DETECTED Final   Candida glabrata NOT DETECTED NOT DETECTED Final   Candida krusei NOT  DETECTED NOT DETECTED Final   Candida parapsilosis NOT DETECTED NOT DETECTED Final   Candida tropicalis NOT DETECTED NOT DETECTED Final   Cryptococcus neoformans/gattii NOT DETECTED NOT DETECTED Final   Meth resistant mecA/C and MREJ NOT DETECTED NOT DETECTED Final    Comment: RESULT CALLED TO, READ BACK BY AND VERIFIED WITH: B,JOHNSON RN @2029  10/08/19 EB  Performed at [facility], [address] [place], [place] [address]   Culture, blood (Routine X 2) w Reflex to ID Panel     Status: None (Preliminary result)   Collection Time: 10/08/19  9:17 PM   Specimen: BLOOD LEFT FOREARM  Result Value Ref Range Status   Specimen Description BLOOD LEFT FOREARM  Final   Special Requests   Final    BOTTLES DRAWN AEROBIC AND ANAEROBIC Blood Culture adequate volume   Culture   Final    NO GROWTH < 12 HOURS Performed at [facility], [address] [place], [place] [address]    Report Status PENDING  Incomplete  Culture, blood (Routine X 2) w Reflex to ID Panel     Status: None (Preliminary result)   Collection Time: 10/08/19  9:17 PM   Specimen: BLOOD LEFT FOREARM  Result Value Ref Range Status   Specimen Description BLOOD LEFT FOREARM  Final   Special Requests   Final    BOTTLES DRAWN AEROBIC AND ANAEROBIC Blood Culture adequate volume   Culture   Final    NO GROWTH < 12 HOURS Performed at [facility], [address], [place], [place] [address]    Report Status PENDING  Incomplete    Acey Lav, MD [facility] [phone] pager  10/09/2019, 2:07 PM

## 2019-10-09 NOTE — Progress Notes (Signed)
*  PRELIMINARY RESULTS* Echocardiogram 2D Echocardiogram has been performed.  Howard Diaz 10/09/2019, 8:34 AM

## 2019-10-10 DIAGNOSIS — B958 Unspecified staphylococcus as the cause of diseases classified elsewhere: Secondary | ICD-10-CM

## 2019-10-10 DIAGNOSIS — F1990 Other psychoactive substance use, unspecified, uncomplicated: Secondary | ICD-10-CM

## 2019-10-10 DIAGNOSIS — I330 Acute and subacute infective endocarditis: Secondary | ICD-10-CM

## 2019-10-10 DIAGNOSIS — M4626 Osteomyelitis of vertebra, lumbar region: Secondary | ICD-10-CM

## 2019-10-10 DIAGNOSIS — D72829 Elevated white blood cell count, unspecified: Secondary | ICD-10-CM

## 2019-10-10 DIAGNOSIS — B182 Chronic viral hepatitis C: Secondary | ICD-10-CM

## 2019-10-10 DIAGNOSIS — B9561 Methicillin susceptible Staphylococcus aureus infection as the cause of diseases classified elsewhere: Secondary | ICD-10-CM

## 2019-10-10 DIAGNOSIS — R7881 Bacteremia: Secondary | ICD-10-CM

## 2019-10-10 DIAGNOSIS — M4646 Discitis, unspecified, lumbar region: Secondary | ICD-10-CM

## 2019-10-10 DIAGNOSIS — E43 Unspecified severe protein-calorie malnutrition: Secondary | ICD-10-CM

## 2019-10-10 LAB — BASIC METABOLIC PANEL
Anion gap: 9 (ref 5–15)
BUN: 12 mg/dL (ref 6–20)
CO2: 26 mmol/L (ref 22–32)
Calcium: 8.2 mg/dL — ABNORMAL LOW (ref 8.9–10.3)
Chloride: 100 mmol/L (ref 98–111)
Creatinine, Ser: 0.78 mg/dL (ref 0.61–1.24)
GFR calc Af Amer: >60 mL/min (ref >60)
GFR calc non Af Amer: >60 mL/min (ref >60)
Glucose, Bld: 132 mg/dL — ABNORMAL HIGH (ref 70–99)
Potassium: 2.9 mmol/L — ABNORMAL LOW (ref 3.5–5.1)
Sodium: 135 mmol/L (ref 135–145)

## 2019-10-10 LAB — CULTURE, BLOOD (ROUTINE X 2)
Special Requests: ADEQUATE
Special Requests: ADEQUATE

## 2019-10-10 LAB — CBC
HCT: 35.6 % — ABNORMAL LOW (ref 39.0–52.0)
Hemoglobin: 12.2 g/dL — ABNORMAL LOW (ref 13.0–17.0)
MCH: 30.7 pg (ref 26.0–34.0)
MCHC: 34.3 g/dL (ref 30.0–36.0)
MCV: 89.7 fL (ref 80.0–100.0)
Platelets: 170 10*3/uL (ref 150–400)
RBC: 3.97 MIL/uL — ABNORMAL LOW (ref 4.22–5.81)
RDW: 11.5 % (ref 11.5–15.5)
WBC: 8.4 10*3/uL (ref 4.0–10.5)
nRBC: 0 % (ref 0.0–0.2)

## 2019-10-10 LAB — MAGNESIUM: Magnesium: 2 mg/dL (ref 1.7–2.4)

## 2019-10-10 MED ORDER — LORAZEPAM 2 MG/ML IJ SOLN
1.0000 mg | Freq: Once | INTRAMUSCULAR | Status: AC
  Administered 2019-10-10: 1 mg via INTRAVENOUS
  Filled 2019-10-10: qty 1

## 2019-10-10 MED ORDER — LORAZEPAM 0.5 MG PO TABS
0.5000 mg | ORAL_TABLET | Freq: Four times a day (QID) | ORAL | Status: DC | PRN
  Administered 2019-10-10 – 2019-10-11 (×2): 1 mg via ORAL
  Filled 2019-10-10 (×2): qty 2

## 2019-10-10 MED ORDER — ENSURE ENLIVE PO LIQD
237.0000 mL | Freq: Two times a day (BID) | ORAL | Status: DC
  Administered 2019-10-10 – 2019-10-11 (×3): 237 mL via ORAL

## 2019-10-10 MED ORDER — PANTOPRAZOLE SODIUM 40 MG PO TBEC
40.0000 mg | DELAYED_RELEASE_TABLET | Freq: Every day | ORAL | Status: DC
  Administered 2019-10-10 – 2019-10-11 (×2): 40 mg via ORAL
  Filled 2019-10-10 (×3): qty 1

## 2019-10-10 MED ORDER — ENOXAPARIN SODIUM 40 MG/0.4ML ~~LOC~~ SOLN
40.0000 mg | SUBCUTANEOUS | Status: DC
  Administered 2019-10-10 – 2019-10-11 (×2): 40 mg via SUBCUTANEOUS
  Filled 2019-10-10 (×2): qty 0.4

## 2019-10-10 NOTE — Progress Notes (Addendum)
Subjective:    Antibiotics:  Anti-infectives (From admission, onward)   Start     Dose/Rate Route Frequency Ordered Stop   10/08/19 1615  ceFAZolin (ANCEF) IVPB 2g/100 mL premix        2 g 200 mL/hr over 30 Minutes Intravenous Every 8 hours 10/08/19 1610 11/05/19 1359      Medications: Scheduled Meds: . enoxaparin (LOVENOX) injection  40 mg Subcutaneous Q24H  . feeding supplement (ENSURE ENLIVE)  237 mL Oral BID BM  . lidocaine  1 patch Transdermal Q24H  . LORazepam  0-4 mg Intravenous Q12H   Or  . LORazepam  0-4 mg Oral Q12H  . multivitamin with minerals  1 tablet Oral Daily  . pantoprazole  40 mg Oral Daily  . saccharomyces boulardii  250 mg Oral BID  . thiamine  100 mg Oral Daily   Or  . thiamine  100 mg Intravenous Daily   Continuous Infusions: .  ceFAZolin (ANCEF) IV 2 g (10/10/19 1454)   PRN Meds:.acetaminophen, cyclobenzaprine, HYDROcodone-acetaminophen, ketorolac, nicotine, ondansetron **OR** ondansetron (ZOFRAN) IV, polyethylene glycol, senna-docusate    Objective: Weight change:   Intake/Output Summary (Last 24 hours) at 10/10/2019 1530 Last data filed at 10/10/2019 1100 Gross per 24 hour  Intake 480 ml  Output 1150 ml  Net -670 ml   Blood pressure 109/73, pulse 77, temperature 98.8 F (37.1 C), temperature source Oral, resp. rate 16, height 6' (1.829 m), weight 90.5 kg, SpO2 99 %. Temp:  [98.6 F (37 C)-99.1 F (37.3 C)] 98.8 F (37.1 C) (08/22 1200) Pulse Rate:  [67-87] 77 (08/22 1200) Resp:  [16-18] 16 (08/22 1200) BP: (109-118)/(68-73) 109/73 (08/22 1200) SpO2:  [99 %-100 %] 99 % (08/22 1200) Weight:  [90.5 kg] 90.5 kg (08/22 0607)  Physical Exam:   CBC:    BMET Recent Labs    10/08/19 0944 10/10/19 0856  NA 132* 135  K 3.5 2.9*  CL 99 100  CO2 23 26  GLUCOSE 152* 132*  BUN 8 12  CREATININE 0.67 0.78  CALCIUM 8.5* 8.2*     Liver Panel  Recent Labs    10/07/19 2018 10/08/19 0944  PROT 7.3 7.2  ALBUMIN 3.9  3.7  AST 19 16  ALT 17 16  ALKPHOS 63 64  BILITOT 0.7 0.8       Sedimentation Rate Recent Labs    10/07/19 2018  ESRSEDRATE 8   C-Reactive Protein Recent Labs    10/07/19 2018  CRP 8.3*    Micro Results: Recent Results (from the past 720 hour(s))  SARS Coronavirus 2 by RT PCR (hospital order, performed in [facility] hospital lab) Nasopharyngeal Nasopharyngeal Swab     Status: None   Collection Time: 10/07/19  6:53 PM   Specimen: Nasopharyngeal Swab  Result Value Ref Range Status   SARS Coronavirus 2 NEGATIVE NEGATIVE Final    Comment: (NOTE) SARS-CoV-2 target nucleic acids are NOT DETECTED.  The SARS-CoV-2 RNA is generally detectable in upper and lower respiratory specimens during the acute phase of infection. The lowest concentration of SARS-CoV-2 viral copies this assay can detect is 250 copies / mL. A negative result does not preclude SARS-CoV-2 infection and should not be used as the sole basis for treatment or other patient management decisions.  A negative result may occur with improper specimen collection / handling, submission of specimen other than nasopharyngeal swab, presence of viral mutation(s) within the areas targeted by this assay, and inadequate number of  viral copies (<250 copies / mL). A negative result must be combined with clinical observations, patient history, and epidemiological information.  Fact Sheet for Patients:   BoilerBrush.com.cy  Fact Sheet for Healthcare Providers: https://pope.com/  This test is not yet approved or  cleared by the [place] FDA and has been authorized for detection and/or diagnosis of SARS-CoV-2 by FDA under an Emergency Use Authorization (EUA).  This EUA will remain in effect (meaning this test can be used) for the duration of the COVID-19 declaration under Section 564(b)(1) of the Act, 21 U.S.C. section 360bbb-3(b)(1), unless the authorization is terminated  or revoked sooner.  Performed at [facility], [address], [place], [place] [address]   Culture, blood (routine x 2)     Status: Abnormal   Collection Time: 10/07/19  8:33 PM   Specimen: BLOOD LEFT FOREARM  Result Value Ref Range Status   Specimen Description   Final    BLOOD LEFT FOREARM Performed at [facility], [address], [place], [place] [address]    Special Requests   Final    BOTTLES DRAWN AEROBIC AND ANAEROBIC Blood Culture adequate volume Performed at [facility], [address] [place], [place] [address]    Culture  Setup Time   Final    ANAEROBIC AND AEROBIC BOTTLES GRAM POSITIVE COCCI Gram Stain Report Called to,Read Back By and Verified With: STYPA,N@1505  BY MATTHEWS, B 8.20.21 [facility] CRITICAL VALUE NOTED.  VALUE IS CONSISTENT WITH PREVIOUSLY REPORTED AND CALLED VALUE.    Culture (A)  Final    STAPHYLOCOCCUS AUREUS SUSCEPTIBILITIES PERFORMED ON PREVIOUS CULTURE WITHIN THE LAST 5 DAYS. Performed at [facility], [address], [place], [place] [address]    Report Status 10/10/2019 FINAL  Final  Culture, blood (routine x 2)     Status: Abnormal   Collection Time: 10/07/19  8:33 PM   Specimen: BLOOD LEFT FOREARM  Result Value Ref Range Status   Specimen Description   Final    BLOOD LEFT FOREARM Performed at [facility], [address], [place], [place] [address]    Special Requests   Final    BOTTLES DRAWN AEROBIC AND ANAEROBIC Blood Culture adequate volume Performed at [facility], [address] [place], [place] [address]    Culture  Setup Time   Final    ANAEROBIC AND AEROBIC BOTTLES GRAM POSITIVE COCCI Gram Stain Report Called to,Read Back By and Verified With: STYPA,N @1505  BY MATTHEWS,B 8.20.21 [facility] Organism ID to follow CRITICAL RESULT CALLED TO, READ BACK BY AND VERIFIED WITH: B,JOHNSON RN @2029  10/08/19 EB Performed at [facility], [address], [place], [address] [place]    Culture STAPHYLOCOCCUS  AUREUS (A)  Final   Report Status 10/10/2019 FINAL  Final   Organism ID, Bacteria STAPHYLOCOCCUS AUREUS  Final      Susceptibility   Staphylococcus aureus - MIC*    CIPROFLOXACIN <=0.5 SENSITIVE Sensitive     ERYTHROMYCIN >=8 RESISTANT Resistant     GENTAMICIN <=0.5 SENSITIVE Sensitive     OXACILLIN 0.5 SENSITIVE Sensitive     TETRACYCLINE <=1 SENSITIVE Sensitive     VANCOMYCIN <=0.5 SENSITIVE Sensitive     TRIMETH/SULFA <=10 SENSITIVE Sensitive     CLINDAMYCIN <=0.25 SENSITIVE Sensitive     RIFAMPIN <=0.5 SENSITIVE Sensitive     Inducible Clindamycin NEGATIVE Sensitive     * STAPHYLOCOCCUS AUREUS  Blood Culture ID Panel (Reflexed)     Status: Abnormal   Collection Time:  10/07/19  8:33 PM  Result Value Ref Range Status   Enterococcus faecalis NOT DETECTED NOT DETECTED Final   Enterococcus Faecium NOT DETECTED NOT DETECTED Final   Listeria monocytogenes NOT DETECTED NOT DETECTED Final   Staphylococcus species DETECTED (A) NOT DETECTED Final    Comment: RESULT CALLED TO, READ BACK BY AND VERIFIED WITH: B,JOHNSON RN @2029  10/08/19 EB    Staphylococcus aureus (BCID) DETECTED (A) NOT DETECTED Final    Comment: RESULT CALLED TO, READ BACK BY AND VERIFIED WITH: B,JOHNSON RN @2029  10/08/19 EB    Staphylococcus epidermidis NOT DETECTED NOT DETECTED Final   Staphylococcus lugdunensis NOT DETECTED NOT DETECTED Final   Streptococcus species NOT DETECTED NOT DETECTED Final   Streptococcus agalactiae NOT DETECTED NOT DETECTED Final   Streptococcus pneumoniae NOT DETECTED NOT DETECTED Final   Streptococcus pyogenes NOT DETECTED NOT DETECTED Final   A.calcoaceticus-baumannii NOT DETECTED NOT DETECTED Final   Bacteroides fragilis NOT DETECTED NOT DETECTED Final   Enterobacterales NOT DETECTED NOT DETECTED Final   Enterobacter cloacae complex NOT DETECTED NOT DETECTED Final   Escherichia coli NOT DETECTED NOT DETECTED Final   Klebsiella aerogenes NOT DETECTED NOT DETECTED Final   Klebsiella  oxytoca NOT DETECTED NOT DETECTED Final   Klebsiella pneumoniae NOT DETECTED NOT DETECTED Final   Proteus species NOT DETECTED NOT DETECTED Final   Salmonella species NOT DETECTED NOT DETECTED Final   Serratia marcescens NOT DETECTED NOT DETECTED Final   Haemophilus influenzae NOT DETECTED NOT DETECTED Final   Neisseria meningitidis NOT DETECTED NOT DETECTED Final   Pseudomonas aeruginosa NOT DETECTED NOT DETECTED Final   Stenotrophomonas maltophilia NOT DETECTED NOT DETECTED Final   Candida albicans NOT DETECTED NOT DETECTED Final   Candida auris NOT DETECTED NOT DETECTED Final   Candida glabrata NOT DETECTED NOT DETECTED Final   Candida krusei NOT DETECTED NOT DETECTED Final   Candida parapsilosis NOT DETECTED NOT DETECTED Final   Candida tropicalis NOT DETECTED NOT DETECTED Final   Cryptococcus neoformans/gattii NOT DETECTED NOT DETECTED Final   Meth resistant mecA/C and MREJ NOT DETECTED NOT DETECTED Final    Comment: RESULT CALLED TO, READ BACK BY AND VERIFIED WITH: B,JOHNSON RN @2029  10/08/19 EB Performed at [facility], [address] [place], [place] [address]   Culture, blood (Routine X 2) w Reflex to ID Panel     Status: None (Preliminary result)   Collection Time: 10/08/19  9:17 PM   Specimen: BLOOD LEFT FOREARM  Result Value Ref Range Status   Specimen Description BLOOD LEFT FOREARM  Final   Special Requests   Final    BOTTLES DRAWN AEROBIC AND ANAEROBIC Blood Culture adequate volume   Culture   Final    NO GROWTH 2 DAYS Performed at [facility], [address] [place], [place] [address]    Report Status PENDING  Incomplete  Culture, blood (Routine X 2) w Reflex to ID Panel     Status: None (Preliminary result)   Collection Time: 10/08/19  9:17 PM   Specimen: BLOOD LEFT FOREARM  Result Value Ref Range Status   Specimen Description BLOOD LEFT FOREARM  Final   Special Requests   Final    BOTTLES DRAWN AEROBIC AND ANAEROBIC Blood Culture adequate volume    Culture   Final    NO GROWTH 2 DAYS Performed at [facility], [address] [place], [place] [address]    Report Status PENDING  Incomplete    Studies/Results: ECHOCARDIOGRAM COMPLETE  Result Date: 10/09/2019  ECHOCARDIOGRAM REPORT   Patient Name:   Howard Diaz Date of Exam: 10/09/2019 Medical Rec #:  [id number]        Height:       72.0 in Accession #:    [id number]       Weight:       195.8 lb Date of Birth:  05-30-1985         BSA:          2.111 m Patient Age:    34 years         BP:           113/70 mmHg Patient Gender: M                HR:           85 bpm. [facility] Procedure: 2D Echo Indications:    Bacteremia 790.7 / R78.81  History:        Patient has prior history of Echocardiogram examinations, most                 recent 12/24/2017. Signs/Symptoms:Bacteremia; Risk                 Factors:Current Smoker. IV drug user, Osteomyelitis of lumbar                 spine , ETOH.  Sonographer:    Jeryl Columbia RDCS (AE) Referring Phys: [id number] CL[place] L JOHNSON IMPRESSIONS  1. Left ventricular ejection fraction, by estimation, is 60 to 65%. The left ventricle has normal function. The left ventricle has no regional wall motion abnormalities. There is mild left ventricular hypertrophy. Left ventricular diastolic parameters were normal.  2. Right ventricular systolic function is normal. The right ventricular size is normal. There is normal pulmonary artery systolic pressure. The estimated right ventricular systolic pressure is 33.2 mmHg.  3. The mitral valve is normal in structure. No evidence of mitral valve regurgitation.  4. Tricuspid valve regurgitation is mild to moderate.  5. The aortic valve is tricuspid. Aortic valve regurgitation is not visualized. No aortic stenosis is present. Conclusion(s)/Recommendation(s): No vegetation seen. If high clinical suspicion for endocarditis, consider TEE. FINDINGS  Left Ventricle: Left ventricular ejection fraction, by estimation, is 60 to  65%. The left ventricle has normal function. The left ventricle has no regional wall motion abnormalities. The left ventricular internal cavity size was normal in size. There is  mild left ventricular hypertrophy. Left ventricular diastolic parameters were normal. Right Ventricle: The right ventricular size is normal. Right vetricular wall thickness was not assessed. Right ventricular systolic function is normal. There is normal pulmonary artery systolic pressure. The tricuspid regurgitant velocity is 2.75 m/s, and with an assumed right atrial pressure of 3 mmHg, the estimated right ventricular systolic pressure is 33.2 mmHg. Left Atrium: Left atrial size was normal in size. Right Atrium: Right atrial size was normal in size. Pericardium: Trivial pericardial effusion is present. Mitral Valve: The mitral valve is normal in structure. No evidence of mitral valve regurgitation. Tricuspid Valve: The tricuspid valve is normal in structure. Tricuspid valve regurgitation is mild to moderate. Aortic Valve: The aortic valve is tricuspid. Aortic valve regurgitation is not visualized. No aortic stenosis is present. Pulmonic Valve: The pulmonic valve was not well visualized. Pulmonic valve regurgitation is not visualized. Aorta: The aortic root is normal in size and structure. IAS/Shunts: No atrial level shunt detected by color flow Doppler.  LEFT VENTRICLE PLAX 2D LVIDd:  4.67 cm  Diastology LVIDs:         2.59 cm  LV e' lateral:   9.90 cm/s LV PW:         1.17 cm  LV E/e' lateral: 5.7 LV IVS:        0.98 cm  LV e' medial:    8.27 cm/s LVOT diam:     2.00 cm  LV E/e' medial:  6.8 LVOT Area:     3.14 cm  RIGHT VENTRICLE RV S prime:     14.70 cm/s TAPSE (M-mode): 2.1 cm LEFT ATRIUM             Index       RIGHT ATRIUM           Index LA diam:        3.50 cm 1.66 cm/m  RA Area:     12.00 cm LA Vol (A2C):   34.9 ml 16.53 ml/m RA Volume:   27.30 ml  12.93 ml/m LA Vol (A4C):   23.6 ml 11.18 ml/m LA Biplane Vol: 29.6 ml  14.02 ml/m   AORTA Ao Root diam: 2.70 cm MITRAL VALVE               TRICUSPID VALVE MV Area (PHT): 2.91 cm    TR Peak grad:   30.2 mmHg MV Decel Time: 261 msec    TR Vmax:        275.00 cm/s MV E velocity: 56.60 cm/s MV A velocity: 37.70 cm/s  SHUNTS MV E/A ratio:  1.50        Systemic Diam: 2.00 cm Epifanio Lesches MD Electronically signed by Epifanio Lesches MD Signature Date/Time: 10/09/2019/2:10:38 PM    Final       Assessment/Plan:  INTERVAL HISTORY: TTE negative   Active Problems:   Osteomyelitis of lumbar spine (HCC)   IV drug user   Hepatitis-C   Tobacco abuse   Protein-calorie malnutrition, severe   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 male with  hx of IVDU, alcoholic cirrhosis, prior MSSA bacteremia and LS osteomyelitis diskitis in 2019 now admitted with severe back pain and found to have MSSAB again. MRI of L-S seems relatively stable. Apparently he has multiple infected skin lesions  #1 MSSA bacteremia  If patient agree-able to stay would give  4 weeks of IV antibiotics followed by 4 weeks of po antibiotics  Would get TEE (if + textbook rx would be 6 weeks of IV therapy  Monitor skin lesions ( I wonder if this is due to picking of skin in context of amphetamine use  #2 IVDU  --will screen for HCV, HBV again (last time HCV was ND on quant RNA  Case discussed with Dr. Laural Benes.   LOS: 1 day   Acey Lav 10/10/2019, 3:30 PM

## 2019-10-10 NOTE — Progress Notes (Signed)
C/O increased anxiety earlier and received order for increased frequency of ativan.  Gave 1 mg po at 1630 and still anxious.  Contacted Dr. Laural Benes with the information.

## 2019-10-10 NOTE — TOC Initial Note (Signed)
Transition of Care [facility]) - Initial/Assessment Note    Patient Details  Name: Howard Diaz MRN: [id number] Date of Birth: [birth year]  [facility] CM/SW Contact:    Elliot Gault, LCSW Phone Number: 10/10/2019, 1:47 PM  Clinical Narrative:                  Pt admitted from home. He needs four weeks of IV anbx therapy. Pt with history of drug use and leaving the hospital AMA. ID consulting. Recommendation is for the four weeks of treatment but if pt declines to stay, then give one dose of Oritanvancin and then discharge with oral Zyvox.   MD asked TOC if there is a way to provide pt with Zyvox from the hospital if he decides to leave AMA. Pt has [place] and so he is not eligible for [facility] assistance. Pt would need to obtain the Zyvox from his [employer] in [place]. Zyvox requires prior authorization with [place]. Per Dr. Laural Benes, he went through prior auth for Zyvox during last admission and pt did not complete the course so he has concerns that the [facility] won't cover it again. Per Dr. Laural Benes, if pt won't stay, he will likely get the dose of Oritanvancin before leaving along with the instructions to follow up with ID clinic for further care and prescription of Zyvox.   Assigned TOC will follow and assist as needed.  Expected Discharge Plan: Home/Self Care Barriers to Discharge: Continued Medical Work up   Patient Goals and CMS Choice        Expected Discharge Plan and Services Expected Discharge Plan: Home/Self Care In-house Referral: Clinical Social Work     Living arrangements for the past 2 months: Mobile Home                                      Prior Living Arrangements/Services Living arrangements for the past 2 months: Mobile Home Lives with:: Parents Patient language and need for interpreter reviewed:: Yes Do you feel safe going back to the place where you live?: Yes      Need for Family Participation in  Patient Care: No (Comment) Care giver support system in place?: Yes (comment)   Criminal Activity/Legal Involvement Pertinent to Current Situation/Hospitalization: No - Comment as needed  Activities of Daily Living Home Assistive Devices/Equipment: None ADL Screening (condition at time of admission) Patient's cognitive ability adequate to safely complete daily activities?: Yes Is the patient deaf or have difficulty hearing?: No Does the patient have difficulty seeing, even when wearing glasses/contacts?: No Does the patient have difficulty concentrating, remembering, or making decisions?: No Patient able to express need for assistance with ADLs?: Yes Does the patient have difficulty dressing or bathing?: No Independently performs ADLs?: Yes (appropriate for developmental age) Does the patient have difficulty walking or climbing stairs?: No Weakness of Legs: None Weakness of Arms/Hands: None  Permission Sought/Granted                  Emotional Assessment       Orientation: : Oriented to Self, Oriented to Place, Oriented to  Time, Oriented to Situation Alcohol / Substance Use: Illicit Drugs Psych Involvement: No (comment)  Admission diagnosis:  Intractable back pain [M54.9] Acute bilateral low back pain without sciatica [M54.5] Intractable low back pain [M54.5] Patient Active Problem List   Diagnosis Date Noted  . Intractable low back pain  10/09/2019  . Intractable back pain 10/08/2019  . MSSA bacteremia 10/08/2019  . Discitis of lumbar region 10/08/2019  . Noncompliance 10/08/2019  . Leukocytosis 10/08/2019  . Acute bilateral low back pain without sciatica   . 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 Protein-calorie malnutrition, severe 01/02/2018  . Osteomyelitis of lumbar spine (HCC) 12/23/2017  . IV drug user 12/23/2017  . Acute osteomyelitis of lumbar spine (HCC) 12/23/2017  . Hepatitis-C 12/23/2017  . Lactic acid acidosis 12/23/2017  . Sepsis (HCC)  12/23/2017  . Hypokalemia 12/23/2017  . Tobacco abuse    PCP:  Patient, No Pcp Per Pharmacy:   [facility] [address], [place] - [address] [place] [address] Phone: [phone] Fax: [phone]     Social Determinants of Health (SDOH) Interventions    Readmission Risk Interventions Readmission Risk Prevention Plan 10/10/2019  Medication Screening Complete  Transportation Screening Complete

## 2019-10-10 NOTE — Progress Notes (Signed)
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  He completed 4 weeks of IV cefazolin and subsequently left the hospital AGAINST MEDICAL ADVICE.  He followed up with our [facility] clinic and was given Zyvox however did not complete full course.  He returns to the ED in August 2021 complaining of back pain.  MRI confirms chronic osteomyelitis and discitis at L3-L4.  His blood cultures were positive for Staph aureus.  ID has been consulted and recommending at least 4 weeks of IV cefazolin if possible and TEE.  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  I had a long discussion with patient and he agreed to stay and complete treatment at this time.  Unfortunately he does have a history of leaving AGAINST MEDICAL ADVICE.  I spoke with Dr. Daiva Eves 8/22 and he has recommended pursuing a TEE.  I spoke with the patient and he is agreeable to having the procedure done.  If the patient has valvular vegetations the optimal treatment course would be 6 weeks of IV cefazolin.  MRI did not show abscess. 2. MSSA bacteremia-continue IV cefazolin.  Repeat blood cultures 10/09/2019 no growth to date.  Pursue TEE  10/11/2019 when cardiology is available. 3. Chronic lumbar back pain secondary to discitis-symptomatic treatment ordered.  Treating discitis as above. 4. Leukocytosis-likely secondary to discitis.  Follow CBC. 5. Moderate protein calorie malnutrition secondary to chronic substance abuse-supplements ordered. 6. Diffuse skin infection-patient has multiple pustules on the upper and lower extremities and trunk likely from picking skin in the context of amphetamine abuse.  Treating these lesions with the IV antibiotics.  Topical skin care. 7. Reported history of HCV-HCV quant RNA was undetected when checked in 2019.  He is being screened again for HCV and HBV by the ID team. 8. Tobacco - will offer nicotine patch for use while in hospital.   DVT prophylaxis: enoxaparin  Code Status: full  Family Communication: mother telephone update Disposition:   Status is: Inpatient  Remains inpatient appropriate because:IV treatments appropriate due to intensity of illness or inability to take PO.  Pt unable to be discharged with a PICC line for home IV antibiotics due to history of IV drug abuse.    Dispo: The patient is from: Home              Anticipated d/c is to: Home              Anticipated d/c date is: > 3 days              Patient currently is not medically stable to d/c.  Consultants:   Infectious Disease   Procedures:  Antimicrobials:  Cefazolin 8/20>>   Subjective: Pt reports skin lesions over entire body.  He complains of occasional low back pain but at this time pain is controlled.    Objective: Vitals:   10/09/19 1406 10/10/19 0038 10/10/19 0607 10/10/19 1200  BP: 108/63 109/68 118/70 109/73  Pulse: 82 67 87 77  Resp: 17 18 18 16   Temp: 98.4 F (36.9 C) 98.6 F (37 C) 99.1 F (37.3 C) 98.8 F (37.1 C)  TempSrc: Oral Oral Oral Oral  SpO2: 100% 99% 100% 99%  Weight:   90.5 kg   Height:        Intake/Output Summary (Last 24 hours) at 10/10/2019 1642 Last data filed at  10/10/2019 1100 Gross per 24 hour  Intake 480 ml  Output 1150 ml  Net -670 ml   Filed Weights   10/07/19 1824 10/08/19 0509 10/10/19 0607  Weight: 90.7 kg 88.8 kg 90.5 kg    Examination:  General exam: Appears calm and comfortable  Respiratory system: Clear to auscultation. Respiratory effort normal. Cardiovascular system: S1 & S2 heard, RRR. No JVD, murmurs, rubs, gallops or clicks. No pedal edema. Gastrointestinal system: Abdomen is nondistended, soft and nontender. No organomegaly or masses felt. Normal bowel sounds heard. Central nervous system: Alert and oriented. No focal neurological deficits. Extremities: Symmetric 5 x 5 power. Skin: No rashes, lesions or ulcers Psychiatry: Judgement and insight appear normal. Mood & affect appropriate.   Data Reviewed: I have personally reviewed following labs and imaging studies  CBC: Recent Labs  Lab 10/07/19 2018 10/08/19 0944 10/10/19 0856  WBC 13.7* 15.0* 8.4  NEUTROABS 10.8* 12.2*  --   HGB 13.6 14.1 12.2*  HCT 38.7* 40.5 35.6*  MCV 88.6 89.0 89.7  PLT 231 213 170    Basic Metabolic Panel: Recent Labs  Lab 10/07/19 2018 10/08/19 0944 10/10/19 0856  NA 133* 132* 135  K 3.6 3.5 2.9*  CL 100 99 100  CO2 23 23 26   GLUCOSE 131* 152* 132*  BUN 8 8 12   CREATININE 0.67 0.67 0.78  CALCIUM 8.6* 8.5* 8.2*  MG  --  1.8 2.0    GFR: Estimated Creatinine Clearance: 141.5 mL/min (by C-G formula based on SCr of 0.78 mg/dL).  Liver Function Tests: Recent Labs  Lab 10/07/19 2018 10/08/19 0944  AST 19 16  ALT 17 16  ALKPHOS 63 64  BILITOT 0.7 0.8  PROT 7.3 7.2  ALBUMIN 3.9 3.7    CBG: No results for input(s): GLUCAP in the last 168 hours.  Recent Results (from the past 240 hour(s))  SARS Coronavirus 2 by RT PCR (hospital order, performed in [facility] hospital lab) Nasopharyngeal Nasopharyngeal Swab     Status: None   Collection Time: 10/07/19  6:53 PM   Specimen: Nasopharyngeal Swab  Result Value Ref Range  Status   SARS Coronavirus 2 NEGATIVE NEGATIVE Final    Comment: (NOTE) SARS-CoV-2 target nucleic acids are NOT DETECTED.  The SARS-CoV-2 RNA is generally detectable in upper and lower respiratory specimens during the acute phase of infection. The lowest concentration of SARS-CoV-2 viral copies this assay can detect is 250 copies / mL. A negative result does not preclude SARS-CoV-2 infection and should not be used as the sole basis for treatment or other patient management decisions.  A negative result may occur with improper specimen collection / handling, submission of specimen other than nasopharyngeal swab, presence of viral mutation(s) within the areas targeted by this assay, and inadequate number of viral copies (<  250 copies / mL). A negative result must be combined with clinical observations, patient history, and epidemiological information.  Fact Sheet for Patients:   BoilerBrush.com.cy  Fact Sheet for Healthcare Providers: https://pope.com/  This test is not yet approved or  cleared by the [place] FDA and has been authorized for detection and/or diagnosis of SARS-CoV-2 by FDA under an Emergency Use Authorization (EUA).  This EUA will remain in effect (meaning this test can be used) for the duration of the COVID-19 declaration under Section 564(b)(1) of the Act, 21 U.S.C. section 360bbb-3(b)(1), unless the authorization is terminated or revoked sooner.  Performed at [facility], [address] [place], [place] [address]   Culture, blood (routine x 2)     Status: Abnormal   Collection Time: 10/07/19  8:33 PM   Specimen: BLOOD LEFT FOREARM  Result Value Ref Range Status   Specimen Description   Final    BLOOD LEFT FOREARM Performed at [facility], [address] [place], [place] [address]    Special Requests   Final    BOTTLES DRAWN AEROBIC AND ANAEROBIC Blood Culture adequate volume Performed at [facility], [address], [place], [place] [address]    Culture  Setup Time   Final    ANAEROBIC AND AEROBIC BOTTLES GRAM POSITIVE COCCI Gram Stain Report Called to,Read Back By and Verified With: STYPA,N@1505  BY MATTHEWS, B 8.20.21 [facility] CRITICAL VALUE NOTED.  VALUE IS CONSISTENT WITH PREVIOUSLY REPORTED AND CALLED VALUE.    Culture (A)  Final    STAPHYLOCOCCUS AUREUS SUSCEPTIBILITIES PERFORMED ON PREVIOUS CULTURE WITHIN THE LAST 5 DAYS. Performed at [facility], [address] [place], [place] [address]    Report Status 10/10/2019 FINAL  Final  Culture, blood (routine x 2)     Status: Abnormal   Collection Time: 10/07/19  8:33 PM   Specimen: BLOOD LEFT FOREARM  Result Value Ref Range Status   Specimen Description   Final    BLOOD LEFT FOREARM Performed at [facility], [address], [place], [place] [address]    Special Requests   Final    BOTTLES DRAWN AEROBIC AND ANAEROBIC Blood Culture adequate volume Performed at [facility], [address], [place], [place] [address]    Culture  Setup Time   Final    ANAEROBIC AND AEROBIC BOTTLES GRAM POSITIVE COCCI Gram Stain Report Called to,Read Back By and Verified With: STYPA,N  BY MATTHEWS,B 8.20.21 [facility] Organism ID to follow CRITICAL RESULT CALLED TO, READ BACK BY AND VERIFIED WITH: B,Octavius Shin RN  10/08/19 EB Performed at [facility], [address], [place], [place] [address]    Culture STAPHYLOCOCCUS AUREUS (A)  Final   Report Status 10/10/2019 FINAL  Final   Organism ID, Bacteria STAPHYLOCOCCUS AUREUS  Final      Susceptibility   Staphylococcus aureus - MIC*    CIPROFLOXACIN <=0.5 SENSITIVE Sensitive     ERYTHROMYCIN >=8 RESISTANT Resistant     GENTAMICIN <=0.5 SENSITIVE Sensitive     OXACILLIN 0.5 SENSITIVE Sensitive     TETRACYCLINE <=1 SENSITIVE Sensitive     VANCOMYCIN <=0.5 SENSITIVE Sensitive     TRIMETH/SULFA <=10 SENSITIVE Sensitive     CLINDAMYCIN <=0.25 SENSITIVE  Sensitive     RIFAMPIN <=0.5 SENSITIVE Sensitive     Inducible Clindamycin NEGATIVE Sensitive     * STAPHYLOCOCCUS AUREUS  Blood Culture ID Panel (Reflexed)     Status: Abnormal   Collection Time: 10/07/19  8:33 PM  Result Value Ref Range Status   Enterococcus faecalis NOT DETECTED NOT DETECTED Final   Enterococcus Faecium NOT DETECTED NOT DETECTED Final   Listeria monocytogenes NOT DETECTED NOT DETECTED Final   Staphylococcus species DETECTED (A) NOT DETECTED Final    Comment: RESULT CALLED TO, READ BACK BY AND VERIFIED WITH: B,Arlin Savona RN @2029  10/08/19 EB    Staphylococcus aureus (BCID) DETECTED (A) NOT DETECTED Final    Comment: RESULT CALLED TO, READ BACK BY AND VERIFIED WITH: B,Roni Scow RN @2029  10/08/19 EB    Staphylococcus epidermidis NOT DETECTED NOT DETECTED Final   Staphylococcus lugdunensis NOT DETECTED NOT DETECTED Final   Streptococcus species NOT DETECTED NOT DETECTED Final   Streptococcus agalactiae NOT DETECTED NOT DETECTED Final   Streptococcus pneumoniae NOT DETECTED NOT DETECTED Final   Streptococcus pyogenes NOT DETECTED NOT DETECTED Final   A.calcoaceticus-baumannii NOT DETECTED NOT DETECTED Final   Bacteroides fragilis NOT DETECTED NOT DETECTED Final   Enterobacterales NOT DETECTED NOT DETECTED Final   Enterobacter cloacae complex NOT DETECTED NOT DETECTED Final   Escherichia coli NOT DETECTED NOT DETECTED Final   Klebsiella aerogenes NOT DETECTED NOT DETECTED Final   Klebsiella oxytoca NOT DETECTED NOT DETECTED Final   Klebsiella pneumoniae NOT DETECTED NOT DETECTED Final   Proteus species NOT DETECTED NOT DETECTED Final   Salmonella species NOT DETECTED NOT DETECTED Final   Serratia marcescens NOT DETECTED NOT DETECTED Final   Haemophilus influenzae NOT DETECTED NOT DETECTED Final   Neisseria meningitidis NOT DETECTED NOT DETECTED Final   Pseudomonas aeruginosa NOT DETECTED NOT DETECTED Final   Stenotrophomonas maltophilia NOT DETECTED NOT DETECTED Final    Candida albicans NOT DETECTED NOT DETECTED Final   Candida auris NOT DETECTED NOT DETECTED Final   Candida glabrata NOT DETECTED NOT DETECTED Final   Candida krusei NOT DETECTED NOT DETECTED Final   Candida parapsilosis NOT DETECTED NOT DETECTED Final   Candida tropicalis NOT DETECTED NOT DETECTED Final   Cryptococcus neoformans/gattii NOT DETECTED NOT DETECTED Final   Meth resistant mecA/C and MREJ NOT DETECTED NOT DETECTED Final    Comment: RESULT CALLED TO, READ BACK BY AND VERIFIED WITH: B,Isel Skufca RN @2029  10/08/19 EB Performed at [facility], [address] [place], [place] [address]   Culture, blood (Routine X 2) w Reflex to ID Panel     Status: None (Preliminary result)   Collection Time: 10/08/19  9:17 PM   Specimen: BLOOD LEFT FOREARM  Result Value Ref Range Status   Specimen Description BLOOD LEFT FOREARM  Final   Special Requests   Final    BOTTLES DRAWN AEROBIC AND ANAEROBIC Blood Culture adequate volume   Culture   Final    NO GROWTH 2 DAYS Performed at [facility], [address] [place], [place] [address]    Report Status PENDING  Incomplete  Culture, blood (Routine X 2) w Reflex to ID Panel     Status: None (Preliminary result)   Collection Time: 10/08/19  9:17 PM   Specimen: BLOOD LEFT FOREARM  Result Value Ref Range Status   Specimen Description BLOOD LEFT FOREARM  Final   Special Requests   Final    BOTTLES DRAWN AEROBIC AND ANAEROBIC Blood Culture adequate volume   Culture   Final    NO GROWTH 2 DAYS Performed at [facility], [address] [place], [place] [address]    Report Status PENDING  Incomplete     Radiology Studies: ECHOCARDIOGRAM COMPLETE  Result Date: 10/09/2019  ECHOCARDIOGRAM REPORT   Patient Name:   DIONNE ROSSA Date of Exam: 10/09/2019 Medical Rec #:  [id number]        Height:       72.0 in Accession #:    [id number]       Weight:       195.8 lb Date of Birth:  02-14-1986         BSA:          2.111 m Patient Age:    34 years          BP:           113/70 mmHg Patient Gender: M                HR:           85 bpm. [facility] Procedure: 2D Echo Indications:    Bacteremia 790.7 / R78.81  History:        Patient has prior history of Echocardiogram examinations, most                 recent 12/24/2017. Signs/Symptoms:Bacteremia; Risk                 Factors:Current Smoker. IV drug user, Osteomyelitis of lumbar                 spine , ETOH.  Sonographer:    Jeryl Columbia RDCS (AE) Referring Phys: [id number] Isis Costanza L Arlene Brickel IMPRESSIONS  1. Left ventricular ejection fraction, by estimation, is 60 to 65%. The left ventricle has normal function. The left ventricle has no regional wall motion abnormalities. There is mild left ventricular hypertrophy. Left ventricular diastolic parameters were normal.  2. Right ventricular systolic function is normal. The right ventricular size is normal. There is normal pulmonary artery systolic pressure. The estimated right ventricular systolic pressure is 33.2 mmHg.  3. The mitral valve is normal in structure. No evidence of mitral valve regurgitation.  4. Tricuspid valve regurgitation is mild to moderate.  5. The aortic valve is tricuspid. Aortic valve regurgitation is not visualized. No aortic stenosis is present. Conclusion(s)/Recommendation(s): No vegetation seen. If high clinical suspicion for endocarditis, consider TEE. FINDINGS  Left Ventricle: Left ventricular ejection fraction, by estimation, is 60 to 65%. The left ventricle has normal function. The left ventricle has no regional wall motion abnormalities. The left ventricular internal cavity size was normal in size. There is  mild left ventricular hypertrophy. Left ventricular diastolic parameters were normal. Right Ventricle: The right ventricular size is normal. Right vetricular wall thickness was not assessed. Right ventricular systolic function is normal. There is normal pulmonary artery systolic pressure. The tricuspid regurgitant  velocity is 2.75 m/s, and with an assumed right atrial pressure of 3 mmHg, the estimated right ventricular systolic pressure is 33.2 mmHg. Left Atrium: Left atrial size was normal in size. Right Atrium: Right atrial size was normal in size. Pericardium: Trivial pericardial effusion is present. Mitral Valve: The mitral valve is normal in structure. No evidence of mitral valve regurgitation. Tricuspid Valve: The tricuspid valve is normal in structure. Tricuspid valve regurgitation is mild to moderate. Aortic Valve: The aortic valve is tricuspid. Aortic valve regurgitation is not visualized. No aortic stenosis is present. Pulmonic Valve: The pulmonic valve was not well visualized. Pulmonic valve regurgitation is not visualized. Aorta: The aortic root is normal in size and structure. IAS/Shunts: No atrial level shunt detected by color flow Doppler.  LEFT VENTRICLE PLAX 2D LVIDd:  4.67 cm  Diastology LVIDs:         2.59 cm  LV e' lateral:   9.90 cm/s LV PW:         1.17 cm  LV E/e' lateral: 5.7 LV IVS:        0.98 cm  LV e' medial:    8.27 cm/s LVOT diam:     2.00 cm  LV E/e' medial:  6.8 LVOT Area:     3.14 cm  RIGHT VENTRICLE RV S prime:     14.70 cm/s TAPSE (M-mode): 2.1 cm LEFT ATRIUM             Index       RIGHT ATRIUM           Index LA diam:        3.50 cm 1.66 cm/m  RA Area:     12.00 cm LA Vol (A2C):   34.9 ml 16.53 ml/m RA Volume:   27.30 ml  12.93 ml/m LA Vol (A4C):   23.6 ml 11.18 ml/m LA Biplane Vol: 29.6 ml 14.02 ml/m   AORTA Ao Root diam: 2.70 cm MITRAL VALVE               TRICUSPID VALVE MV Area (PHT): 2.91 cm    TR Peak grad:   30.2 mmHg MV Decel Time: 261 msec    TR Vmax:        275.00 cm/s MV E velocity: 56.60 cm/s MV A velocity: 37.70 cm/s  SHUNTS MV E/A ratio:  1.50        Systemic Diam: 2.00 cm Epifanio Lesches MD Electronically signed by Epifanio Lesches MD Signature Date/Time: 10/09/2019/2:10:38 PM    Final    Scheduled Meds:  enoxaparin (LOVENOX) injection  40 mg  Subcutaneous Q24H   feeding supplement (ENSURE ENLIVE)  237 mL Oral BID BM   lidocaine  1 patch Transdermal Q24H   multivitamin with minerals  1 tablet Oral Daily   pantoprazole  40 mg Oral Daily   saccharomyces boulardii  250 mg Oral BID   thiamine  100 mg Oral Daily   Or   thiamine  100 mg Intravenous Daily   Continuous Infusions:   ceFAZolin (ANCEF) IV 2 g (10/10/19 1454)    LOS: 1 day   Time spent: 30 mins    Karinna Beadles Laural Benes, MD How to contact the [facility] Attending or Consulting provider 7A - 7P or covering provider during after hours 7P -7A, for this patient?  1. Check the care team in [facility] and look for a) attending/consulting TR[facility] provider listed and b) the [facility] team listed 2. Log into [email or web address] and use [place]'s universal password to access. If you do not have the password, please contact the hospital operator. 3. Locate the [facility] provider you are looking for under [facility] and page to a number that you can be directly reached. 4. If you still have difficulty reaching the provider, please page the [facility] (Director on Call) for the Hospitalists listed on amion for assistance.  10/10/2019, 4:42 PM

## 2019-10-11 MED ORDER — ALPRAZOLAM 1 MG PO TABS
1.0000 mg | ORAL_TABLET | Freq: Three times a day (TID) | ORAL | Status: DC | PRN
  Administered 2019-10-11 – 2019-10-12 (×3): 1 mg via ORAL
  Filled 2019-10-11 (×4): qty 1

## 2019-10-11 MED ORDER — POTASSIUM CHLORIDE CRYS ER 20 MEQ PO TBCR
60.0000 meq | EXTENDED_RELEASE_TABLET | Freq: Once | ORAL | Status: AC
  Administered 2019-10-11: 60 meq via ORAL

## 2019-10-11 NOTE — Progress Notes (Signed)
PROGRESS NOTE   Howard Diaz  JKD:[id number] DOB: 30-Mar-1985 DOA: 10/07/2019 PCP: Patient, No Pcp Per   Chief Complaint  Patient presents with  . 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  He completed 4 weeks of IV cefazolin and subsequently left the hospital AGAINST MEDICAL ADVICE.  He followed up with our [facility] clinic and was given Zyvox however did not complete full course.  He returns to the ED in August 2021 complaining of back pain.  MRI confirms chronic osteomyelitis and discitis at L3-L4.  His blood cultures were positive for Staph aureus.  ID has been consulted and recommending at least 4 weeks of IV cefazolin if possible and TEE.  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  I had a long discussion with patient and he agreed to stay and complete treatment at this time.  Unfortunately he does have a history of leaving AGAINST MEDICAL ADVICE.  I spoke with Dr. Daiva Eves 8/22 and he has recommended pursuing a TEE.  I spoke with the patient and he is agreeable to having the procedure done.  I spoke with Dr. Wyline Mood and he will tentatively plan for TEE in next 1-2 days.   If the patient has valvular vegetations the optimal treatment course would be 6 weeks of IV cefazolin.  Fortunately, MRI did not show abscess in the lumbar  spine.  2. MSSA bacteremia-continue IV cefazolin.  Repeated blood cultures 10/09/2019 no growth to date.  Pursue TEE 10/11/2019 when cardiology is available. 3. Chronic lumbar back pain secondary to discitis-symptomatic treatment ordered.  Treating discitis as above. 4. Leukocytosis-likely secondary to discitis.  Follow CBC. 5. Moderate protein calorie malnutrition secondary to chronic substance abuse-supplements ordered. 6. Cutaneous staph skin infection-patient has multiple pustules on the upper and lower extremities and trunk likely from picking skin in the context of amphetamine abuse.  Treating these lesions with the IV antibiotics.  Topical skin care and hibiclens bathing.  7. Reported history of HCV-HCV quant RNA was undetected when checked in 2019.  He is being screened again for HCV and HBV by the ID team. 8. Tobacco - offer nicotine patch for use while in hospital. 9. GAD - he is fidgety.  We have ordered alprazolam TID prn anxiety symptoms.     DVT prophylaxis: enoxaparin  Code Status: full  Family Communication: mother telephone update Disposition:   Status is: Inpatient  Remains inpatient appropriate because:IV treatments appropriate due to intensity of illness or inability to take PO.  Pt unable to be discharged with a PICC line for home IV antibiotics due to history of IV drug abuse.    Dispo: The patient is from: Home              Anticipated d/c is to: Home  Anticipated d/c date is: > 3 days              Patient currently is not medically stable to d/c.  Consultants:   Infectious Disease   Procedures:     Antimicrobials:  Cefazolin 8/20>>   Subjective: Pt reports that he is fidgety and anxiety is not controlled.      Objective: Vitals:   10/10/19 1800 10/10/19 1841 10/10/19 2238 10/11/19 0536  BP: 132/81 (!) 108/54 116/79 114/71  Pulse: 84 84 89 80  Resp: 18 18 14 14   Temp: 99.2 F (37.3 C) 98.8 F (37.1 C) 99.9 F (37.7 C) 98.5 F (36.9 C)   TempSrc: Oral  Oral Oral  SpO2: 98% 98% 97% 97%  Weight:      Height:        Intake/Output Summary (Last 24 hours) at 10/11/2019 1306 Last data filed at 10/10/2019 1700 Gross per 24 hour  Intake --  Output 250 ml  Net -250 ml   Filed Weights   10/07/19 1824 10/08/19 0509 10/10/19 0607  Weight: 90.7 kg 88.8 kg 90.5 kg    Examination:  General exam: Appears calm and comfortable  Respiratory system: Clear to auscultation. Respiratory effort normal. Cardiovascular system: normal S1 & S2 heard. No pedal edema.   Gastrointestinal system: Abdomen is nondistended, soft and nontender. No organomegaly or masses felt. Normal bowel sounds heard. Central nervous system: Alert and oriented. No focal neurological deficits. Extremities: Symmetric 5 x 5 power. Skin: multiple pustules over hands, legs and trunk.  Psychiatry: Judgement and insight appear normal. Mood & affect appropriate.   Data Reviewed: I have personally reviewed following labs and imaging studies  CBC: Recent Labs  Lab 10/07/19 2018 10/08/19 0944 10/10/19 0856  WBC 13.7* 15.0* 8.4  NEUTROABS 10.8* 12.2*  --   HGB 13.6 14.1 12.2*  HCT 38.7* 40.5 35.6*  MCV 88.6 89.0 89.7  PLT 231 213 170    Basic Metabolic Panel: Recent Labs  Lab 10/07/19 2018 10/08/19 0944 10/10/19 0856  NA 133* 132* 135  K 3.6 3.5 2.9*  CL 100 99 100  CO2 23 23 26   GLUCOSE 131* 152* 132*  BUN 8 8 12   CREATININE 0.67 0.67 0.78  CALCIUM 8.6* 8.5* 8.2*  MG  --  1.8 2.0    GFR: Estimated Creatinine Clearance: 141.5 mL/min (by C-G formula based on SCr of 0.78 mg/dL).  Liver Function Tests: Recent Labs  Lab 10/07/19 2018 10/08/19 0944  AST 19 16  ALT 17 16  ALKPHOS 63 64  BILITOT 0.7 0.8  PROT 7.3 7.2  ALBUMIN 3.9 3.7    CBG: No results for input(s): GLUCAP in the last 168 hours.  Recent Results (from the past 240 hour(s))  SARS Coronavirus 2 by RT PCR (hospital order, performed in [facility] hospital lab) Nasopharyngeal  Nasopharyngeal Swab     Status: None   Collection Time: 10/07/19  6:53 PM   Specimen: Nasopharyngeal Swab  Result Value Ref Range Status   SARS Coronavirus 2 NEGATIVE NEGATIVE Final    Comment: (NOTE) SARS-CoV-2 target nucleic acids are NOT DETECTED.  The SARS-CoV-2 RNA is generally detectable in upper and lower respiratory specimens during the acute phase of infection. The lowest concentration of SARS-CoV-2 viral copies this assay can detect is 250 copies / mL. A negative result does not preclude SARS-CoV-2 infection and should not be used as the sole basis for treatment or other patient management decisions.  A negative result may occur with  improper specimen collection / handling, submission of specimen other than nasopharyngeal swab, presence of viral mutation(s) within the areas targeted by this assay, and inadequate number of viral copies (<250 copies / mL). A negative result must be combined with clinical observations, patient history, and epidemiological information.  Fact Sheet for Patients:   BoilerBrush.com.cy  Fact Sheet for Healthcare Providers: https://pope.com/  This test is not yet approved or  cleared by the [place] FDA and has been authorized for detection and/or diagnosis of SARS-CoV-2 by FDA under an Emergency Use Authorization (EUA).  This EUA will remain in effect (meaning this test can be used) for the duration of the COVID-19 declaration under Section 564(b)(1) of the Act, 21 U.S.C. section 360bbb-3(b)(1), unless the authorization is terminated or revoked sooner.  Performed at [facility], [address], [place], [place] [address]   Culture, blood (routine x 2)     Status: Abnormal   Collection Time: 10/07/19  8:33 PM   Specimen: BLOOD LEFT FOREARM  Result Value Ref Range Status   Specimen Description   Final    BLOOD LEFT FOREARM Performed at [facility], [address], [place], [place]  [address]    Special Requests   Final    BOTTLES DRAWN AEROBIC AND ANAEROBIC Blood Culture adequate volume Performed at [facility], [address] [place], [place] [address]    Culture  Setup Time   Final    ANAEROBIC AND AEROBIC BOTTLES GRAM POSITIVE COCCI Gram Stain Report Called to,Read Back By and Verified With: STYPA,N@1505  BY MATTHEWS, B 8.20.21 [facility] CRITICAL VALUE NOTED.  VALUE IS CONSISTENT WITH PREVIOUSLY REPORTED AND CALLED VALUE.    Culture (A)  Final    STAPHYLOCOCCUS AUREUS SUSCEPTIBILITIES PERFORMED ON PREVIOUS CULTURE WITHIN THE LAST 5 DAYS. Performed at [facility], [address], [place], [place] [address]    Report Status 10/10/2019 FINAL  Final  Culture, blood (routine x 2)     Status: Abnormal   Collection Time: 10/07/19  8:33 PM   Specimen: BLOOD LEFT FOREARM  Result Value Ref Range Status   Specimen Description   Final    BLOOD LEFT FOREARM Performed at [facility], [address], [place], [place] [address]    Special Requests   Final    BOTTLES DRAWN AEROBIC AND ANAEROBIC Blood Culture adequate volume Performed at [facility], [address], [place], [place] [address]    Culture  Setup Time   Final    ANAEROBIC AND AEROBIC BOTTLES GRAM POSITIVE COCCI Gram Stain Report Called to,Read Back By and Verified With: STYPA,N  BY MATTHEWS,B 8.20.21 [facility] Organism ID to follow CRITICAL RESULT CALLED TO, READ BACK BY AND VERIFIED WITH: B,Severin Bou RN  10/08/19 EB Performed at [facility], [address], [place], [place] [address]    Culture STAPHYLOCOCCUS AUREUS (A)  Final   Report Status 10/10/2019 FINAL  Final   Organism ID, Bacteria STAPHYLOCOCCUS AUREUS  Final      Susceptibility   Staphylococcus aureus - MIC*    CIPROFLOXACIN <=0.5 SENSITIVE Sensitive     ERYTHROMYCIN >=8 RESISTANT Resistant     GENTAMICIN <=0.5 SENSITIVE Sensitive     OXACILLIN 0.5 SENSITIVE Sensitive     TETRACYCLINE <=1 SENSITIVE  Sensitive     VANCOMYCIN <=0.5 SENSITIVE Sensitive     TRIMETH/SULFA <=10 SENSITIVE Sensitive     CLINDAMYCIN <=0.25 SENSITIVE Sensitive     RIFAMPIN <=0.5 SENSITIVE Sensitive  Inducible Clindamycin NEGATIVE Sensitive     * STAPHYLOCOCCUS AUREUS  Blood Culture ID Panel (Reflexed)     Status: Abnormal   Collection Time: 10/07/19  8:33 PM  Result Value Ref Range Status   Enterococcus faecalis NOT DETECTED NOT DETECTED Final   Enterococcus Faecium NOT DETECTED NOT DETECTED Final   Listeria monocytogenes NOT DETECTED NOT DETECTED Final   Staphylococcus species DETECTED (A) NOT DETECTED Final    Comment: RESULT CALLED TO, READ BACK BY AND VERIFIED WITH: B,Oluwatimilehin Balfour RN @2029  10/08/19 EB    Staphylococcus aureus (BCID) DETECTED (A) NOT DETECTED Final    Comment: RESULT CALLED TO, READ BACK BY AND VERIFIED WITH: B,Zaylie Gisler RN @2029  10/08/19 EB    Staphylococcus epidermidis NOT DETECTED NOT DETECTED Final   Staphylococcus lugdunensis NOT DETECTED NOT DETECTED Final   Streptococcus species NOT DETECTED NOT DETECTED Final   Streptococcus agalactiae NOT DETECTED NOT DETECTED Final   Streptococcus pneumoniae NOT DETECTED NOT DETECTED Final   Streptococcus pyogenes NOT DETECTED NOT DETECTED Final   A.calcoaceticus-baumannii NOT DETECTED NOT DETECTED Final   Bacteroides fragilis NOT DETECTED NOT DETECTED Final   Enterobacterales NOT DETECTED NOT DETECTED Final   Enterobacter cloacae complex NOT DETECTED NOT DETECTED Final   Escherichia coli NOT DETECTED NOT DETECTED Final   Klebsiella aerogenes NOT DETECTED NOT DETECTED Final   Klebsiella oxytoca NOT DETECTED NOT DETECTED Final   Klebsiella pneumoniae NOT DETECTED NOT DETECTED Final   Proteus species NOT DETECTED NOT DETECTED Final   Salmonella species NOT DETECTED NOT DETECTED Final   Serratia marcescens NOT DETECTED NOT DETECTED Final   Haemophilus influenzae NOT DETECTED NOT DETECTED Final   Neisseria meningitidis NOT DETECTED NOT DETECTED  Final   Pseudomonas aeruginosa NOT DETECTED NOT DETECTED Final   Stenotrophomonas maltophilia NOT DETECTED NOT DETECTED Final   Candida albicans NOT DETECTED NOT DETECTED Final   Candida auris NOT DETECTED NOT DETECTED Final   Candida glabrata NOT DETECTED NOT DETECTED Final   Candida krusei NOT DETECTED NOT DETECTED Final   Candida parapsilosis NOT DETECTED NOT DETECTED Final   Candida tropicalis NOT DETECTED NOT DETECTED Final   Cryptococcus neoformans/gattii NOT DETECTED NOT DETECTED Final   Meth resistant mecA/C and MREJ NOT DETECTED NOT DETECTED Final    Comment: RESULT CALLED TO, READ BACK BY AND VERIFIED WITH: B,Angas Isabell RN @2029  10/08/19 EB Performed at [facility], [address] [place], [place] [address]   Culture, blood (Routine X 2) w Reflex to ID Panel     Status: None (Preliminary result)   Collection Time: 10/08/19  9:17 PM   Specimen: BLOOD LEFT FOREARM  Result Value Ref Range Status   Specimen Description BLOOD LEFT FOREARM  Final   Special Requests   Final    BOTTLES DRAWN AEROBIC AND ANAEROBIC Blood Culture adequate volume   Culture   Final    NO GROWTH 2 DAYS Performed at [facility], [address] [place], [place] [address]    Report Status PENDING  Incomplete  Culture, blood (Routine X 2) w Reflex to ID Panel     Status: None (Preliminary result)   Collection Time: 10/08/19  9:17 PM   Specimen: BLOOD LEFT FOREARM  Result Value Ref Range Status   Specimen Description BLOOD LEFT FOREARM  Final   Special Requests   Final    BOTTLES DRAWN AEROBIC AND ANAEROBIC Blood Culture adequate volume   Culture   Final    NO GROWTH 2 DAYS Performed at [facility], [address] [place], [place] [address]    Report Status PENDING  Incomplete     Radiology Studies: No results found. Scheduled Meds: . enoxaparin (LOVENOX) injection  40 mg Subcutaneous Q24H  . feeding supplement (ENSURE ENLIVE)  237 mL Oral BID BM  . lidocaine  1 patch Transdermal Q24H  .  multivitamin with minerals  1 tablet Oral Daily  . pantoprazole  40 mg Oral Daily  . saccharomyces boulardii  250 mg Oral BID  . thiamine  100 mg Oral Daily   Or  . thiamine  100 mg Intravenous Daily   Continuous Infusions: .  ceFAZolin (ANCEF) IV 2 g (10/11/19 0539)    LOS: 2 days   Time spent: 30 mins    Akil Hoos Laural Benes, MD How to contact the [facility] Attending or Consulting provider 7A - 7P or covering provider during after hours 7P -7A, for this patient?  1. Check the care team in [facility] and look for a) attending/consulting [facility] provider listed and b) the [facility] team listed 2. Log into [email or web address] and use [place]'s universal password to access. If you do not have the password, please contact the hospital operator. 3. Locate the [facility] provider you are looking for under [facility] and page to a number that you can be directly reached. 4. If you still have difficulty reaching the provider, please page the [facility] (Director on Call) for the Hospitalists listed on amion for assistance.  10/11/2019, 1:06 PM

## 2019-10-11 NOTE — Progress Notes (Signed)
Pt refused tele box throughout shift.

## 2019-10-11 NOTE — Progress Notes (Signed)
Subjective:    Antibiotics:  Anti-infectives (From admission, onward)   Start     Dose/Rate Route Frequency Ordered Stop   10/08/19 1615  ceFAZolin (ANCEF) IVPB 2g/100 mL premix        2 g 200 mL/hr over 30 Minutes Intravenous Every 8 hours 10/08/19 1610 11/05/19 1359      Medications: Scheduled Meds: . enoxaparin (LOVENOX) injection  40 mg Subcutaneous Q24H  . feeding supplement (ENSURE ENLIVE)  237 mL Oral BID BM  . lidocaine  1 patch Transdermal Q24H  . multivitamin with minerals  1 tablet Oral Daily  . pantoprazole  40 mg Oral Daily  . saccharomyces boulardii  250 mg Oral BID  . thiamine  100 mg Oral Daily   Or  . thiamine  100 mg Intravenous Daily   Continuous Infusions: .  ceFAZolin (ANCEF) IV 2 g (10/11/19 0539)   PRN Meds:.acetaminophen, ALPRAZolam, cyclobenzaprine, HYDROcodone-acetaminophen, ketorolac, nicotine, ondansetron **OR** ondansetron (ZOFRAN) IV, polyethylene glycol, senna-docusate    Objective: Weight change:   Intake/Output Summary (Last 24 hours) at 10/11/2019 1346 Last data filed at 10/10/2019 1700 Gross per 24 hour  Intake --  Output 250 ml  Net -250 ml   Blood pressure 114/71, pulse 80, temperature 98.5 F (36.9 C), temperature source Oral, resp. rate 14, height 6' (1.829 m), weight 90.5 kg, SpO2 97 %. Temp:  [98.5 F (36.9 C)-99.9 F (37.7 C)] 98.5 F (36.9 C) (08/23 0536) Pulse Rate:  [80-89] 80 (08/23 0536) Resp:  [14-18] 14 (08/23 0536) BP: (108-132)/(54-81) 114/71 (08/23 0536) SpO2:  [97 %-98 %] 97 % (08/23 0536)  Physical Exam:   CBC:    BMET Recent Labs    10/10/19 0856  NA 135  K 2.9*  CL 100  CO2 26  GLUCOSE 132*  BUN 12  CREATININE 0.78  CALCIUM 8.2*     Liver Panel  No results for input(s): PROT, ALBUMIN, AST, ALT, ALKPHOS, BILITOT, BILIDIR, IBILI in the last 72 hours.     Sedimentation Rate No results for input(s): ESRSEDRATE in the last 72 hours. C-Reactive Protein No results for  input(s): CRP in the last 72 hours.  Micro Results: Recent Results (from the past 720 hour(s))  SARS Coronavirus 2 by RT PCR (hospital order, performed in [facility] hospital lab) Nasopharyngeal Nasopharyngeal Swab     Status: None   Collection Time: 10/07/19  6:53 PM   Specimen: Nasopharyngeal Swab  Result Value Ref Range Status   SARS Coronavirus 2 NEGATIVE NEGATIVE Final    Comment: (NOTE) SARS-CoV-2 target nucleic acids are NOT DETECTED.  The SARS-CoV-2 RNA is generally detectable in upper and lower respiratory specimens during the acute phase of infection. The lowest concentration of SARS-CoV-2 viral copies this assay can detect is 250 copies / mL. A negative result does not preclude SARS-CoV-2 infection and should not be used as the sole basis for treatment or other patient management decisions.  A negative result may occur with improper specimen collection / handling, submission of specimen other than nasopharyngeal swab, presence of viral mutation(s) within the areas targeted by this assay, and inadequate number of viral copies (<250 copies / mL). A negative result must be combined with clinical observations, patient history, and epidemiological information.  Fact Sheet for Patients:   BoilerBrush.com.cy  Fact Sheet for Healthcare Providers: https://pope.com/  This test is not yet approved or  cleared by the [place] FDA and has been authorized for detection and/or diagnosis of  SARS-CoV-2 by FDA under an Emergency Use Authorization (EUA).  This EUA will remain in effect (meaning this test can be used) for the duration of the COVID-19 declaration under Section 564(b)(1) of the Act, 21 U.S.C. section 360bbb-3(b)(1), unless the authorization is terminated or revoked sooner.  Performed at [facility], [address], [place], [place] [address]   Culture, blood (routine x 2)     Status: Abnormal   Collection Time:  10/07/19  8:33 PM   Specimen: BLOOD LEFT FOREARM  Result Value Ref Range Status   Specimen Description   Final    BLOOD LEFT FOREARM Performed at [facility], [address], [place], [place] [address]    Special Requests   Final    BOTTLES DRAWN AEROBIC AND ANAEROBIC Blood Culture adequate volume Performed at [facility], [address] [place], [place] [address]    Culture  Setup Time   Final    ANAEROBIC AND AEROBIC BOTTLES GRAM POSITIVE COCCI Gram Stain Report Called to,Read Back By and Verified With: STYPA,N@1505  BY MATTHEWS, B 8.20.21 [facility] CRITICAL VALUE NOTED.  VALUE IS CONSISTENT WITH PREVIOUSLY REPORTED AND CALLED VALUE.    Culture (A)  Final    STAPHYLOCOCCUS AUREUS SUSCEPTIBILITIES PERFORMED ON PREVIOUS CULTURE WITHIN THE LAST 5 DAYS. Performed at [facility], [address] [place], [place] [address]    Report Status 10/10/2019 FINAL  Final  Culture, blood (routine x 2)     Status: Abnormal   Collection Time: 10/07/19  8:33 PM   Specimen: BLOOD LEFT FOREARM  Result Value Ref Range Status   Specimen Description   Final    BLOOD LEFT FOREARM Performed at [facility], [address] [place], [place] [address]    Special Requests   Final    BOTTLES DRAWN AEROBIC AND ANAEROBIC Blood Culture adequate volume Performed at [facility], [address] [place], [place] [address]    Culture  Setup Time   Final    ANAEROBIC AND AEROBIC BOTTLES GRAM POSITIVE COCCI Gram Stain Report Called to,Read Back By and Verified With: STYPA,N @1505  BY MATTHEWS,B 8.20.21 [facility] Organism ID to follow CRITICAL RESULT CALLED TO, READ BACK BY AND VERIFIED WITH: B,JOHNSON RN @2029  10/08/19 EB Performed at [facility], [address], [place], [address] [place]    Culture STAPHYLOCOCCUS AUREUS (A)  Final   Report Status 10/10/2019 FINAL  Final   Organism ID, Bacteria STAPHYLOCOCCUS AUREUS  Final      Susceptibility   Staphylococcus aureus - MIC*     CIPROFLOXACIN <=0.5 SENSITIVE Sensitive     ERYTHROMYCIN >=8 RESISTANT Resistant     GENTAMICIN <=0.5 SENSITIVE Sensitive     OXACILLIN 0.5 SENSITIVE Sensitive     TETRACYCLINE <=1 SENSITIVE Sensitive     VANCOMYCIN <=0.5 SENSITIVE Sensitive     TRIMETH/SULFA <=10 SENSITIVE Sensitive     CLINDAMYCIN <=0.25 SENSITIVE Sensitive     RIFAMPIN <=0.5 SENSITIVE Sensitive     Inducible Clindamycin NEGATIVE Sensitive     * STAPHYLOCOCCUS AUREUS  Blood Culture ID Panel (Reflexed)     Status: Abnormal   Collection Time: 10/07/19  8:33 PM  Result Value Ref Range Status   Enterococcus faecalis NOT DETECTED NOT DETECTED Final   Enterococcus Faecium NOT DETECTED NOT DETECTED Final   Listeria monocytogenes NOT DETECTED NOT DETECTED Final   Staphylococcus species DETECTED (A) NOT DETECTED Final    Comment: RESULT CALLED TO, READ BACK BY AND VERIFIED WITH:  B,JOHNSON RN @2029  10/08/19 EB    Staphylococcus aureus (BCID) DETECTED (A) NOT DETECTED Final    Comment: RESULT CALLED TO, READ BACK BY AND VERIFIED WITH: B,JOHNSON RN @2029  10/08/19 EB    Staphylococcus epidermidis NOT DETECTED NOT DETECTED Final   Staphylococcus lugdunensis NOT DETECTED NOT DETECTED Final   Streptococcus species NOT DETECTED NOT DETECTED Final   Streptococcus agalactiae NOT DETECTED NOT DETECTED Final   Streptococcus pneumoniae NOT DETECTED NOT DETECTED Final   Streptococcus pyogenes NOT DETECTED NOT DETECTED Final   A.calcoaceticus-baumannii NOT DETECTED NOT DETECTED Final   Bacteroides fragilis NOT DETECTED NOT DETECTED Final   Enterobacterales NOT DETECTED NOT DETECTED Final   Enterobacter cloacae complex NOT DETECTED NOT DETECTED Final   Escherichia coli NOT DETECTED NOT DETECTED Final   Klebsiella aerogenes NOT DETECTED NOT DETECTED Final   Klebsiella oxytoca NOT DETECTED NOT DETECTED Final   Klebsiella pneumoniae NOT DETECTED NOT DETECTED Final   Proteus species NOT DETECTED NOT DETECTED Final   Salmonella species NOT  DETECTED NOT DETECTED Final   Serratia marcescens NOT DETECTED NOT DETECTED Final   Haemophilus influenzae NOT DETECTED NOT DETECTED Final   Neisseria meningitidis NOT DETECTED NOT DETECTED Final   Pseudomonas aeruginosa NOT DETECTED NOT DETECTED Final   Stenotrophomonas maltophilia NOT DETECTED NOT DETECTED Final   Candida albicans NOT DETECTED NOT DETECTED Final   Candida auris NOT DETECTED NOT DETECTED Final   Candida glabrata NOT DETECTED NOT DETECTED Final   Candida krusei NOT DETECTED NOT DETECTED Final   Candida parapsilosis NOT DETECTED NOT DETECTED Final   Candida tropicalis NOT DETECTED NOT DETECTED Final   Cryptococcus neoformans/gattii NOT DETECTED NOT DETECTED Final   Meth resistant mecA/C and MREJ NOT DETECTED NOT DETECTED Final    Comment: RESULT CALLED TO, READ BACK BY AND VERIFIED WITH: B,JOHNSON RN @2029  10/08/19 EB Performed at [facility], [address] [place], [facility] [address]   Culture, blood (Routine X 2) w Reflex to ID Panel     Status: None (Preliminary result)   Collection Time: 10/08/19  9:17 PM   Specimen: BLOOD LEFT FOREARM  Result Value Ref Range Status   Specimen Description BLOOD LEFT FOREARM  Final   Special Requests   Final    BOTTLES DRAWN AEROBIC AND ANAEROBIC Blood Culture adequate volume   Culture   Final    NO GROWTH 2 DAYS Performed at [facility], [address], [place], [facility] [address]    Report Status PENDING  Incomplete  Culture, blood (Routine X 2) w Reflex to ID Panel     Status: None (Preliminary result)   Collection Time: 10/08/19  9:17 PM   Specimen: BLOOD LEFT FOREARM  Result Value Ref Range Status   Specimen Description BLOOD LEFT FOREARM  Final   Special Requests   Final    BOTTLES DRAWN AEROBIC AND ANAEROBIC Blood Culture adequate volume   Culture   Final    NO GROWTH 2 DAYS Performed at [facility], [address] [place], [facility] [address]    Report Status PENDING  Incomplete    Studies/Results: No  results found.    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 male with  hx of IVDU, alcoholic cirrhosis, prior MSSA bacteremia and LS osteomyelitis diskitis in 2019 now admitted with severe back pain and found to have MSSAB again. MRI of L-S seems relatively stable. Apparently he has multiple infected skin lesions  #1 MSSA bacteremia  If patient agree-able to stay would give  4 weeks of IV antibiotics followed by 4 weeks of po antibiotics   TEE (if + textbook rx would be 6 weeks of IV therapy  Monitor skin lesions ( I wonder if this is due to picking of skin in context of amphetamine use  #2 IVDU  --will screen for HCV, HBV again (last time HCV was ND on quant RNA     LOS: 2 days   Acey LavCornelius Van Dam 10/11/2019, 1:46 PM

## 2019-10-12 ENCOUNTER — Other Ambulatory Visit (HOSPITAL_COMMUNITY): Payer: Medicaid - Out of State

## 2019-10-12 ENCOUNTER — Telehealth: Payer: Self-pay | Admitting: *Deleted

## 2019-10-12 DIAGNOSIS — M4626 Osteomyelitis of vertebra, lumbar region: Secondary | ICD-10-CM

## 2019-10-12 LAB — BASIC METABOLIC PANEL
Anion gap: 11 (ref 5–15)
BUN: 13 mg/dL (ref 6–20)
CO2: 28 mmol/L (ref 22–32)
Calcium: 8.1 mg/dL — ABNORMAL LOW (ref 8.9–10.3)
Chloride: 100 mmol/L (ref 98–111)
Creatinine, Ser: 0.69 mg/dL (ref 0.61–1.24)
GFR calc Af Amer: >60 mL/min (ref >60)
GFR calc non Af Amer: >60 mL/min (ref >60)
Glucose, Bld: 126 mg/dL — ABNORMAL HIGH (ref 70–99)
Potassium: 3.1 mmol/L — ABNORMAL LOW (ref 3.5–5.1)
Sodium: 139 mmol/L (ref 135–145)

## 2019-10-12 SURGERY — ECHOCARDIOGRAM, TRANSESOPHAGEAL
Anesthesia: Monitor Anesthesia Care

## 2019-10-12 MED ORDER — ORITAVANCIN DIPHOSPHATE 400 MG IV SOLR: 1200.0000 mg | Freq: Once | INTRAVENOUS | Status: DC

## 2019-10-12 MED ORDER — LINEZOLID 600 MG PO TABS: 600.0000 mg | ORAL_TABLET | Freq: Two times a day (BID) | ORAL | 0 refills | Status: DC

## 2019-10-12 NOTE — Telephone Encounter (Signed)
Patient left hospital AMA, left message in triage asking for antibiotics. Was supposed to have IV antibiotics for 4 weeks followed by 4 weeks oral antibiotics. He did not stay for oritivancin infusion, did not complete TEE. Please advise. Andree Coss, RN

## 2019-10-12 NOTE — Telephone Encounter (Signed)
Patient's mother called back, said Zyvox has been sent to [facility] in [place], but it is not covered by insurance. Will send to Pharmacy as well.

## 2019-10-12 NOTE — Clinical Social Work Note (Signed)
TOC involved due to Medication assistance needs. Patient has [place]. Patient does not qualify for a MATCH voucher as they are for uninsured only. TOC was in the process of identifying if [place] would pay for his Zyvox, however patient left AMA.    Howard Diaz, Juleen China, LCSW

## 2019-10-12 NOTE — Progress Notes (Signed)
Patient refused telemetry box throughout shift. During AM med administration, patient confessed that he had been sipping on fluids during the night. Patient was educated 3x throughout that he was NPO after midnight. Patient asked did he have to have the procedure. Writer stated the importance of the procedure. Patient stated, "Fuck that procedure, I don't want it."  On-call clinician made aware that patient has refused the procedure and had been drinking during the night.

## 2019-10-12 NOTE — Progress Notes (Signed)
    [facility] has been requested to perform a transesophageal echocardiogram on Howard Diaz for bacteremia.    Patient initially refused the procedure and said "he does not care if he dies at age 34" as he is in so much pain. Patient's nurse at the bedside as well and was going to check with the admitting team in regards to adjusting his pain medications. He was then in agreement for the procedure and I updated his mother [name]) at his request who was in agreement as well. Later made aware by nursing staff he is again refusing the procedure and I went back and talked with the patient and he declines the TEE. Patient using foul language and saying he is going to leave AMA soon. Admitting team made aware. TEE has therefore been canceled.   Signed, Ellsworth Lennox, PA-C  10/12/2019 9:15 AM

## 2019-10-12 NOTE — Progress Notes (Signed)
Patient's mother arrived, patient stated "get this IV out and I'm getting out of here." notified Dr. Laural Benes as he had discussed possibility of oritavansin infusion prior to patient leaving and zyvox prescription at home if covered by his Medicaid. Pharmacy called nursing to notify the oritavansin would be a 3 hr infusion. Patient stated "i'm not staying that long, just get the IV out" notified Dr. Laural Benes. CSW states match voucher can not be given for zyvox, they will assess possibility of medicaid coverage. Dr. Laural Benes saw patient again to review this information. Patient adamant about leaving AMA. Signed AMA paper. IV site removed ,site within normal limits. Patient left floor via ambulation with his mother.

## 2019-10-12 NOTE — Discharge Summary (Addendum)
Physician Discharge Summary  Howard Diaz ZOX:[id number] DOB: 1985/07/09 DOA: 10/07/2019  Admit date: 10/07/2019 Discharge date: 10/12/2019  PATIENT DISCHARGED AGAINST MEDICAL ADVICE   Recommendations for Outpatient Follow-up:  1. PLEASE FOLLOW UP WITH [facility] ASAP   Discharge Condition: GUARDED   CODE STATUS: FULL    Brief Hospitalization Summary: Please see all hospital notes, images, labs for full details of the hospitalization. ADMISSION HPI:  Howard Diaz  is a 34 y.o. male, with history of jaundice, IV drug use, hepatitis C, cirrhosis of liver, alcohol abuse presents to the ED with a chief complaint of back pain.  Patient reports that he has severe pain in his back at the upper lumbar and lower Thoracics.  It started this morning and was sudden onset.  He reports that when he woke up his air mattress was flat and when he tried to get up from there he felt sudden pain in his back.  He does not report feeling anything pulling or popping.  He reports "my back is broke."  He describes the pain as aching and sharp.  It is worse with rolling his first head.  He reports urinary incontinence, no bowel incontinence.  His last bowel movement was yesterday.  He has had no saddle anesthesia.  Patient drinks 2 bootleggers per night, smokes half a pack a day, uses marijuana every night, uses meth once a week.  Patient reports that he feels very similar to when he had osteomyelitis of the spine and discitis in 2019.  Patient states that he is in pain repeatedly, but is falling asleep between sentences, in between writing on the bed.  ED course Temperature 98.4, heart rate 79, respiratory 17-20, blood pressure 127/75, 99% Leukocytosis with 13.7 white blood cell count CHEM panel is grossly unremarkable with a hyperglycemia of 131 Covid negative Blood cultures pending CT lumbar shows spondylosis L3-L4, central stenosis L3-L4, mild stenosis L4-L5, compressive sequelae L4/L5 and L5-S1 CT thoracic spine  shows mild spondylosis no acute or destructive bony lesion  HOSPITAL COURSE:  Sepsis was ruled out.  The patient was admitted to the hospital for MSSA bacteremia that he likely received from injecting amphetamines.  He admitted to this.  The patient had positive blood cultures positive for staph aureus.  Infectious disease specialist were consulted and recommended a TEE study.  The patient initially agreed to have the study but then later refused.  He had a TTE that did not show any findings of vegetations.  The patient initially had agreed to completing 4 weeks of IV cefazolin in the hospital however on 10/12/2019 he became belligerent and refused further treatment.  I offered to give him oritavancin in the hospital which he adamantly has refused because the infusion takes 2 to 3 hours.  I also asked the [facility] team to see if they could provide him with a 4-week supply of Zyvox.  They reported that they could not do that because he had insurance.  He apparently does have [place].  I came and spoke with the patient multiple times asking him to stay in hospital to receive treatment he has adamantly refused and has become belligerent and abusive to staff members.  At that point he signed AMA papers and has left the hospital.  A prescription for Zyvox oral tablets was sent to his pharmacy and a referral for an outpatient appointment with [facility] as he has seen Dr. Luciana Axe in the past who helped him obtain zyvox back in 2019.  I explained the  risks to the patient leaving the hospital including severe disability and death and he verbalized understanding and again refused to stay in the hospital for further treatments.  His mother was also present during these discussions.  She transported him home.  I told the patient that if he changes mind he could return to the hospital at any time and we could restart his treatment therapy.  He verbalized understanding.  Discharge Diagnoses:  Active Problems:    Osteomyelitis of lumbar spine    IV drug user   Hepatitis-C   Tobacco abuse   Protein-calorie malnutrition, severe   Intractable back pain   Acute bilateral low back pain without sciatica   MSSA bacteremia   Discitis of lumbar region   Noncompliance   Leukocytosis   Intractable low back pain    Procedures/Studies: CT THORACIC SPINE W CONTRAST  Result Date: 10/07/2019 CLINICAL DATA:  Low back pain that began this morning, elevated white blood cell count, staph infection EXAM: CT THORACIC AND LUMBAR SPINE WITH CONTRAST TECHNIQUE: Multidetector CT imaging of the thoracic and lumbar spine was performed with contrast. Multiplanar CT image reconstructions were also generated. CONTRAST:  100 cc Omnipaque 300 COMPARISON:  None. FINDINGS: CT THORACIC SPINE FINDINGS Alignment: Alignment is anatomic. Vertebrae: No acute fracture or focal pathologic process. Paraspinal and other soft tissues: Paraspinal soft tissues are unremarkable. Minimal atelectasis at the lung bases. Visualized intra-abdominal structures are unremarkable. Disc levels: There is minimal spondylosis at the cervicothoracic junction and within the lower thoracic spine. No compressive sequela. CT LUMBAR SPINE FINDINGS Segmentation: 5 lumbar type vertebrae. Alignment: Alignment is anatomic. Vertebrae: Marked degenerative endplate changes are seen surrounding the L3/L4 disc space. There are bilateral pars defects of L3 without spondylolisthesis. There are no acute displaced fractures. Paraspinal and other soft tissues: Paraspinal soft tissues are unremarkable. Visualized retroperitoneal structures are normal. Disc levels: There is severe spondylosis at L3/L4 with moderate central canal stenosis. There is bilateral neural foraminal narrowing. At L4/L5 there is a left paracentral disc protrusion with bilateral ligamentum flavum hypertrophy resulting in mild-to-moderate central canal stenosis. There is mild symmetrical neural foraminal encroachment.  At L5/S1 there is mild left paracentral disc bulge with left lateral recess and neural foraminal encroachment. IMPRESSION: CT THORACIC SPINE IMPRESSION 1. Mild spondylosis.  No acute or destructive bony lesion. CT LUMBAR SPINE IMPRESSION 1. Lower lumbar spondylosis most pronounced at L3/L4. Moderate central stenosis at L3/L4, with mild stenosis at L4/L5. Left predominant compressive sequela at L4/L5 and L5/S1. 2. No acute or destructive bony lesion. Electronically Signed   By: Sharlet Salina M.D.   On: 10/07/2019 22:48   CT LUMBAR SPINE W CONTRAST  Result Date: 10/07/2019 CLINICAL DATA:  Low back pain that began this morning, elevated white blood cell count, staph infection EXAM: CT THORACIC AND LUMBAR SPINE WITH CONTRAST TECHNIQUE: Multidetector CT imaging of the thoracic and lumbar spine was performed with contrast. Multiplanar CT image reconstructions were also generated. CONTRAST:  100 cc Omnipaque 300 COMPARISON:  None. FINDINGS: CT THORACIC SPINE FINDINGS Alignment: Alignment is anatomic. Vertebrae: No acute fracture or focal pathologic process. Paraspinal and other soft tissues: Paraspinal soft tissues are unremarkable. Minimal atelectasis at the lung bases. Visualized intra-abdominal structures are unremarkable. Disc levels: There is minimal spondylosis at the cervicothoracic junction and within the lower thoracic spine. No compressive sequela. CT LUMBAR SPINE FINDINGS Segmentation: 5 lumbar type vertebrae. Alignment: Alignment is anatomic. Vertebrae: Marked degenerative endplate changes are seen surrounding the L3/L4 disc space. There are bilateral pars  defects of L3 without spondylolisthesis. There are no acute displaced fractures. Paraspinal and other soft tissues: Paraspinal soft tissues are unremarkable. Visualized retroperitoneal structures are normal. Disc levels: There is severe spondylosis at L3/L4 with moderate central canal stenosis. There is bilateral neural foraminal narrowing. At L4/L5  there is a left paracentral disc protrusion with bilateral ligamentum flavum hypertrophy resulting in mild-to-moderate central canal stenosis. There is mild symmetrical neural foraminal encroachment. At L5/S1 there is mild left paracentral disc bulge with left lateral recess and neural foraminal encroachment. IMPRESSION: CT THORACIC SPINE IMPRESSION 1. Mild spondylosis.  No acute or destructive bony lesion. CT LUMBAR SPINE IMPRESSION 1. Lower lumbar spondylosis most pronounced at L3/L4. Moderate central stenosis at L3/L4, with mild stenosis at L4/L5. Left predominant compressive sequela at L4/L5 and L5/S1. 2. No acute or destructive bony lesion. Electronically Signed   By: Sharlet SalinaMichael  Brown M.D.   On: 10/07/2019 22:48   MR THORACIC SPINE W WO CONTRAST  Result Date: 10/08/2019 CLINICAL DATA:  Rule out epidural abscess. History of IV drug abuse. Elevated white blood count. EXAM: MRI THORACIC WITHOUT AND WITH CONTRAST TECHNIQUE: Multiplanar and multiecho pulse sequences of the thoracic spine were obtained without and with intravenous contrast. CONTRAST:  100mL OMNIPAQUE IOHEXOL 300 MG/ML  SOLN COMPARISON:  CT thoracic spine 10/07/2019 FINDINGS: MRI THORACIC SPINE FINDINGS Alignment:  Normal Vertebrae: Negative for fracture or mass. Normal enhancement of the spine. No evidence of spinal infection in the thoracic spine. Cord: Normal spinal cord signal. No cord lesion or cord compression. Paraspinal and other soft tissues: Bibasilar airspace disease which may represent atelectasis or pneumonia. No pleural effusion. Disc levels: T4-5: Small left-sided disc protrusion T6-7: Moderately large right paracentral disc protrusion with cord flattening on the right. T7-8: Small right-sided disc protrusion with mild cord flattening. T8-9: Small to moderate left-sided disc protrusion with cord flattening on the left. T9-10: Small left-sided disc protrusion. T10-11: Small right-sided disc protrusion. IMPRESSION: No evidence of spinal  infection in the thoracic spine. Negative for abscess. Thoracic degenerative changes with multiple disc protrusions as above. Bibasilar atelectasis/infiltrate. Electronically Signed   By: Marlan Palauharles  Clark M.D.   On: 10/08/2019 14:38   MR Lumbar Spine W Wo Contrast  Result Date: 10/08/2019 CLINICAL DATA:  Sudden onset of severe back pain. History of IV drug abuse, cirrhosis, alcohol abuse. History of lumbar spine infection. Elevated white blood count. EXAM: MRI LUMBAR SPINE WITHOUT AND WITH CONTRAST TECHNIQUE: Multiplanar and multiecho pulse sequences of the lumbar spine were obtained without and with intravenous contrast. CONTRAST:  100mL OMNIPAQUE IOHEXOL 300 MG/ML  SOLN COMPARISON:  CT lumbar spine 10/07/2019.  Lumbar MRI 12/23/2017 FINDINGS: Segmentation:  Normal Alignment:  Slight anterolisthesis L3-4 otherwise normal alignment Vertebrae: Abnormal bone marrow signal at L3-4. Extensive sclerotic changes are noted on CT today. There is also disc space narrowing and endplate irregularity. Small amount of fluid in the disc space. No enhancement of the bone marrow on today's study. This level showed diffuse bone marrow edema and enhancement on the prior MRI consistent with discitis and osteomyelitis. Bilateral pars defects of L3 best seen on the CT. Negative for acute fracture. Conus medullaris and cauda equina: Conus extends to the L1-2 level. Conus and cauda equina appear normal. Paraspinal and other soft tissues: No paraspinous abscess. Mild paraspinous soft tissue thickening around the L3-4 disc space. Disc levels: L1-2: Negative L2-3: Negative L3-4: Findings consistent with chronic discitis and osteomyelitis. No evidence of enhancement today. No abscess. There is diffuse endplate spurring and mild  spinal stenosis with mild subarticular stenosis bilaterally. L4-5: Mild disc bulging. Shallow right-sided disc protrusion. Moderate subarticular foraminal stenosis on the right. L5-S1: Mild disc bulging without  stenosis. IMPRESSION: Findings compatible with chronic discitis and osteomyelitis at L3-4. Negative for abscess or abnormal bone enhancement on today's study. There is diffuse endplate spurring causing mild spinal stenosis and mild subarticular stenosis bilaterally. Disc degeneration L4-5 with small right-sided disc protrusion. Mild disc bulging L5-S1. Electronically Signed   By: Marlan Palau M.D.   On: 10/08/2019 14:33   ECHOCARDIOGRAM COMPLETE  Result Date: 10/09/2019    ECHOCARDIOGRAM REPORT   Patient Name:   MINOR IDEN Date of Exam: 10/09/2019 Medical Rec #:  [id number]        Height:       72.0 in Accession #:    [id number]       Weight:       195.8 lb Date of Birth:  [birth year]         BSA:          2.111 m Patient Age:    34 years         BP:           113/70 mmHg Patient Gender: M                HR:           85 bpm. [facility] Procedure: 2D Echo Indications:    Bacteremia 790.7 / R78.81  History:        Patient has prior history of Echocardiogram examinations, most                 recent 12/24/2017. Signs/Symptoms:Bacteremia; Risk                 Factors:Current Smoker. IV drug user, Osteomyelitis of lumbar                 spine , ETOH.  Sonographer:    Jeryl Columbia RDCS (AE) Referring Phys: [id number] Stephana Morell L Halah Whiteside IMPRESSIONS  1. Left ventricular ejection fraction, by estimation, is 60 to 65%. The left ventricle has normal function. The left ventricle has no regional wall motion abnormalities. There is mild left ventricular hypertrophy. Left ventricular diastolic parameters were normal.  2. Right ventricular systolic function is normal. The right ventricular size is normal. There is normal pulmonary artery systolic pressure. The estimated right ventricular systolic pressure is 33.2 mmHg.  3. The mitral valve is normal in structure. No evidence of mitral valve regurgitation.  4. Tricuspid valve regurgitation is mild to moderate.  5. The aortic valve is tricuspid. Aortic valve  regurgitation is not visualized. No aortic stenosis is present. Conclusion(s)/Recommendation(s): No vegetation seen. If high clinical suspicion for endocarditis, consider TEE. FINDINGS  Left Ventricle: Left ventricular ejection fraction, by estimation, is 60 to 65%. The left ventricle has normal function. The left ventricle has no regional wall motion abnormalities. The left ventricular internal cavity size was normal in size. There is  mild left ventricular hypertrophy. Left ventricular diastolic parameters were normal. Right Ventricle: The right ventricular size is normal. Right vetricular wall thickness was not assessed. Right ventricular systolic function is normal. There is normal pulmonary artery systolic pressure. The tricuspid regurgitant velocity is 2.75 m/s, and with an assumed right atrial pressure of 3 mmHg, the estimated right ventricular systolic pressure is 33.2 mmHg. Left Atrium: Left atrial size was normal in size. Right Atrium: Right atrial size was normal in size.  Pericardium: Trivial pericardial effusion is present. Mitral Valve: The mitral valve is normal in structure. No evidence of mitral valve regurgitation. Tricuspid Valve: The tricuspid valve is normal in structure. Tricuspid valve regurgitation is mild to moderate. Aortic Valve: The aortic valve is tricuspid. Aortic valve regurgitation is not visualized. No aortic stenosis is present. Pulmonic Valve: The pulmonic valve was not well visualized. Pulmonic valve regurgitation is not visualized. Aorta: The aortic root is normal in size and structure. IAS/Shunts: No atrial level shunt detected by color flow Doppler.  LEFT VENTRICLE PLAX 2D LVIDd:         4.67 cm  Diastology LVIDs:         2.59 cm  LV e' lateral:   9.90 cm/s LV PW:         1.17 cm  LV E/e' lateral: 5.7 LV IVS:        0.98 cm  LV e' medial:    8.27 cm/s LVOT diam:     2.00 cm  LV E/e' medial:  6.8 LVOT Area:     3.14 cm  RIGHT VENTRICLE RV S prime:     14.70 cm/s TAPSE (M-mode):  2.1 cm LEFT ATRIUM             Index       RIGHT ATRIUM           Index LA diam:        3.50 cm 1.66 cm/m  RA Area:     12.00 cm LA Vol (A2C):   34.9 ml 16.53 ml/m RA Volume:   27.30 ml  12.93 ml/m LA Vol (A4C):   23.6 ml 11.18 ml/m LA Biplane Vol: 29.6 ml 14.02 ml/m   AORTA Ao Root diam: 2.70 cm MITRAL VALVE               TRICUSPID VALVE MV Area (PHT): 2.91 cm    TR Peak grad:   30.2 mmHg MV Decel Time: 261 msec    TR Vmax:        275.00 cm/s MV E velocity: 56.60 cm/s MV A velocity: 37.70 cm/s  SHUNTS MV E/A ratio:  1.50        Systemic Diam: 2.00 cm Epifanio Lesches MD Electronically signed by Epifanio Lesches MD Signature Date/Time: 10/09/2019/2:10:38 PM    Final       Discharge Exam: Vitals:   10/12/19 0002 10/12/19 0659  BP: 124/73 114/67  Pulse: 72 62  Resp: 18 16  Temp: 98.6 F (37 C) 98.4 F (36.9 C)  SpO2: 97% 100%   Vitals:   10/11/19 0536 10/11/19 1450 10/12/19 0002 10/12/19 0659  BP: 114/71 110/78 124/73 114/67  Pulse: 80 89 72 62  Resp: 14 15 18 16   Temp: 98.5 F (36.9 C) 98.1 F (36.7 C) 98.6 F (37 C) 98.4 F (36.9 C)  TempSrc: Oral Oral Oral Oral  SpO2: 97% 98% 97% 100%  Weight:   93.9 kg   Height:       General: Pt is alert, awake, oriented x 3, not in acute distress Cardiovascular: normal S1/S2 +, no rubs, no gallops Respiratory: CTA bilaterally, no wheezing, no rhonchi Abdominal: Soft, NT, ND, bowel sounds + Extremities: no edema, no cyanosis   The results of significant diagnostics from this hospitalization (including imaging, microbiology, ancillary and laboratory) are listed below for reference.     Microbiology: Recent Results (from the past 240 hour(s))  SARS Coronavirus 2 by RT PCR (hospital order, performed in [facility]  hospital lab) Nasopharyngeal Nasopharyngeal Swab     Status: None   Collection Time: 10/07/19  6:53 PM   Specimen: Nasopharyngeal Swab  Result Value Ref Range Status   SARS Coronavirus 2 NEGATIVE NEGATIVE Final     Comment: (NOTE) SARS-CoV-2 target nucleic acids are NOT DETECTED.  The SARS-CoV-2 RNA is generally detectable in upper and lower respiratory specimens during the acute phase of infection. The lowest concentration of SARS-CoV-2 viral copies this assay can detect is 250 copies / mL. A negative result does not preclude SARS-CoV-2 infection and should not be used as the sole basis for treatment or other patient management decisions.  A negative result may occur with improper specimen collection / handling, submission of specimen other than nasopharyngeal swab, presence of viral mutation(s) within the areas targeted by this assay, and inadequate number of viral copies (<250 copies / mL). A negative result must be combined with clinical observations, patient history, and epidemiological information.  Fact Sheet for Patients:   BoilerBrush.com.cy  Fact Sheet for Healthcare Providers: https://pope.com/  This test is not yet approved or  cleared by the [place] FDA and has been authorized for detection and/or diagnosis of SARS-CoV-2 by FDA under an Emergency Use Authorization (EUA).  This EUA will remain in effect (meaning this test can be used) for the duration of the COVID-19 declaration under Section 564(b)(1) of the Act, 21 U.S.C. section 360bbb-3(b)(1), unless the authorization is terminated or revoked sooner.  Performed at [facility], [address] [place], [place] [address]   Culture, blood (routine x 2)     Status: Abnormal   Collection Time: 10/07/19  8:33 PM   Specimen: BLOOD LEFT FOREARM  Result Value Ref Range Status   Specimen Description   Final    BLOOD LEFT FOREARM Performed at [facility], [address], [place], [place] [address]    Special Requests   Final    BOTTLES DRAWN AEROBIC AND ANAEROBIC Blood Culture adequate volume Performed at [facility], [address], [place], [place] [address]    Culture  Setup  Time   Final    ANAEROBIC AND AEROBIC BOTTLES GRAM POSITIVE COCCI Gram Stain Report Called to,Read Back By and Verified With: STYPA,N@[email or web address]  BY MATTHEWS, B 8.20.21 [facility] CRITICAL VALUE NOTED.  VALUE IS CONSISTENT WITH PREVIOUSLY REPORTED AND CALLED VALUE.    Culture (A)  Final    STAPHYLOCOCCUS AUREUS SUSCEPTIBILITIES PERFORMED ON PREVIOUS CULTURE WITHIN THE LAST 5 DAYS. Performed at [facility], [address], [place], [place] [address]    Report Status 10/10/2019 FINAL  Final  Culture, blood (routine x 2)     Status: Abnormal   Collection Time: 10/07/19  8:33 PM   Specimen: BLOOD LEFT FOREARM  Result Value Ref Range Status   Specimen Description   Final    BLOOD LEFT FOREARM Performed at [facility], [address], [place], [place] [address]    Special Requests   Final    BOTTLES DRAWN AEROBIC AND ANAEROBIC Blood Culture adequate volume Performed at [facility], [address] [place], [place] [address]    Culture  Setup Time   Final    ANAEROBIC AND AEROBIC BOTTLES GRAM POSITIVE COCCI Gram Stain Report Called to,Read Back By and Verified With: STYPA,N  BY MATTHEWS,B 8.20.21 [facility] Organism ID to follow CRITICAL RESULT CALLED TO, READ BACK BY AND VERIFIED WITH: B,Rogenia Werntz RN  10/08/19 EB Performed at [facility], [address],  [place], [place] [address]    Culture STAPHYLOCOCCUS AUREUS (A)  Final   Report Status 10/10/2019 FINAL  Final   Organism ID, Bacteria STAPHYLOCOCCUS AUREUS  Final      Susceptibility   Staphylococcus aureus - MIC*    CIPROFLOXACIN <=0.5 SENSITIVE Sensitive     ERYTHROMYCIN >=8 RESISTANT Resistant     GENTAMICIN <=0.5 SENSITIVE Sensitive     OXACILLIN 0.5 SENSITIVE Sensitive     TETRACYCLINE <=1 SENSITIVE Sensitive     VANCOMYCIN <=0.5 SENSITIVE Sensitive     TRIMETH/SULFA <=10 SENSITIVE Sensitive     CLINDAMYCIN <=0.25 SENSITIVE Sensitive     RIFAMPIN <=0.5 SENSITIVE Sensitive     Inducible  Clindamycin NEGATIVE Sensitive     * STAPHYLOCOCCUS AUREUS  Blood Culture ID Panel (Reflexed)     Status: Abnormal   Collection Time: 10/07/19  8:33 PM  Result Value Ref Range Status   Enterococcus faecalis NOT DETECTED NOT DETECTED Final   Enterococcus Faecium NOT DETECTED NOT DETECTED Final   Listeria monocytogenes NOT DETECTED NOT DETECTED Final   Staphylococcus species DETECTED (A) NOT DETECTED Final    Comment: RESULT CALLED TO, READ BACK BY AND VERIFIED WITH: B,Jacqueline Delapena RN @2029  10/08/19 EB    Staphylococcus aureus (BCID) DETECTED (A) NOT DETECTED Final    Comment: RESULT CALLED TO, READ BACK BY AND VERIFIED WITH: B,Khamila Bassinger RN @2029  10/08/19 EB    Staphylococcus epidermidis NOT DETECTED NOT DETECTED Final   Staphylococcus lugdunensis NOT DETECTED NOT DETECTED Final   Streptococcus species NOT DETECTED NOT DETECTED Final   Streptococcus agalactiae NOT DETECTED NOT DETECTED Final   Streptococcus pneumoniae NOT DETECTED NOT DETECTED Final   Streptococcus pyogenes NOT DETECTED NOT DETECTED Final   A.calcoaceticus-baumannii NOT DETECTED NOT DETECTED Final   Bacteroides fragilis NOT DETECTED NOT DETECTED Final   Enterobacterales NOT DETECTED NOT DETECTED Final   Enterobacter cloacae complex NOT DETECTED NOT DETECTED Final   Escherichia coli NOT DETECTED NOT DETECTED Final   Klebsiella aerogenes NOT DETECTED NOT DETECTED Final   Klebsiella oxytoca NOT DETECTED NOT DETECTED Final   Klebsiella pneumoniae NOT DETECTED NOT DETECTED Final   Proteus species NOT DETECTED NOT DETECTED Final   Salmonella species NOT DETECTED NOT DETECTED Final   Serratia marcescens NOT DETECTED NOT DETECTED Final   Haemophilus influenzae NOT DETECTED NOT DETECTED Final   Neisseria meningitidis NOT DETECTED NOT DETECTED Final   Pseudomonas aeruginosa NOT DETECTED NOT DETECTED Final   Stenotrophomonas maltophilia NOT DETECTED NOT DETECTED Final   Candida albicans NOT DETECTED NOT DETECTED Final   Candida auris  NOT DETECTED NOT DETECTED Final   Candida glabrata NOT DETECTED NOT DETECTED Final   Candida krusei NOT DETECTED NOT DETECTED Final   Candida parapsilosis NOT DETECTED NOT DETECTED Final   Candida tropicalis NOT DETECTED NOT DETECTED Final   Cryptococcus neoformans/gattii NOT DETECTED NOT DETECTED Final   Meth resistant mecA/C and MREJ NOT DETECTED NOT DETECTED Final    Comment: RESULT CALLED TO, READ BACK BY AND VERIFIED WITH: B,Rolondo Pierre RN @2029  10/08/19 EB Performed at [facility], [address] [place], [place] [address]   Culture, blood (Routine X 2) w Reflex to ID Panel     Status: None (Preliminary result)   Collection Time: 10/08/19  9:17 PM   Specimen: BLOOD LEFT FOREARM  Result Value Ref Range Status   Specimen Description BLOOD LEFT FOREARM  Final   Special Requests   Final    BOTTLES DRAWN AEROBIC AND ANAEROBIC Blood Culture adequate  volume   Culture   Final    NO GROWTH 2 DAYS Performed at [facility], [address], [place], [place] [address]    Report Status PENDING  Incomplete  Culture, blood (Routine X 2) w Reflex to ID Panel     Status: None (Preliminary result)   Collection Time: 10/08/19  9:17 PM   Specimen: BLOOD LEFT FOREARM  Result Value Ref Range Status   Specimen Description BLOOD LEFT FOREARM  Final   Special Requests   Final    BOTTLES DRAWN AEROBIC AND ANAEROBIC Blood Culture adequate volume   Culture   Final    NO GROWTH 2 DAYS Performed at [facility], [address], [place], [place] [address]    Report Status PENDING  Incomplete     Labs: BNP (last 3 results) No results for input(s): BNP in the last 8760 hours. Basic Metabolic Panel: Recent Labs  Lab 10/07/19 2018 10/08/19 0944 10/10/19 0856 10/12/19 0647  NA 133* 132* 135 139  K 3.6 3.5 2.9* 3.1*  CL 100 99 100 100  CO2 23 23 26 28   GLUCOSE 131* 152* 132* 126*  BUN 8 8 12 13   CREATININE 0.67 0.67 0.78 0.69  CALCIUM 8.6* 8.5* 8.2* 8.1*  MG  --  1.8 2.0  --    Liver  Function Tests: Recent Labs  Lab 10/07/19 2018 10/08/19 0944  AST 19 16  ALT 17 16  ALKPHOS 63 64  BILITOT 0.7 0.8  PROT 7.3 7.2  ALBUMIN 3.9 3.7   No results for input(s): LIPASE, AMYLASE in the last 168 hours. No results for input(s): AMMONIA in the last 168 hours. CBC: Recent Labs  Lab 10/07/19 2018 10/08/19 0944 10/10/19 0856  WBC 13.7* 15.0* 8.4  NEUTROABS 10.8* 12.2*  --   HGB 13.6 14.1 12.2*  HCT 38.7* 40.5 35.6*  MCV 88.6 89.0 89.7  PLT 231 213 170   Cardiac Enzymes: No results for input(s): CKTOTAL, CKMB, CKMBINDEX, TROPONINI in the last 168 hours. BNP: Invalid input(s): POCBNP CBG: No results for input(s): GLUCAP in the last 168 hours. D-Dimer No results for input(s): DDIMER in the last 72 hours. Hgb A1c No results for input(s): HGBA1C in the last 72 hours. Lipid Profile No results for input(s): CHOL, HDL, LDLCALC, TRIG, CHOLHDL, LDLDIRECT in the last 72 hours. Thyroid function studies No results for input(s): TSH, T4TOTAL, T3FREE, THYROIDAB in the last 72 hours.  Invalid input(s): FREET3 Anemia work up No results for input(s): VITAMINB12, FOLATE, FERRITIN, TIBC, IRON, RETICCTPCT in the last 72 hours. Urinalysis    Component Value Date/Time   COLORURINE YELLOW 10/07/2019 1852   APPEARANCEUR CLEAR 10/07/2019 1852   LABSPEC 1.012 10/07/2019 1852   PHURINE 9.0 (H) 10/07/2019 1852   GLUCOSEU NEGATIVE 10/07/2019 1852   HGBUR NEGATIVE 10/07/2019 1852   BILIRUBINUR NEGATIVE 10/07/2019 1852   KETONESUR NEGATIVE 10/07/2019 1852   PROTEINUR NEGATIVE 10/07/2019 1852   NITRITE NEGATIVE 10/07/2019 1852   LEUKOCYTESUR NEGATIVE 10/07/2019 1852   Sepsis Labs Invalid input(s): PROCALCITONIN,  WBC,  LACTICIDVEN Microbiology Recent Results (from the past 240 hour(s))  SARS Coronavirus 2 by RT PCR (hospital order, performed in [facility] hospital lab) Nasopharyngeal Nasopharyngeal Swab     Status: None   Collection Time: 10/07/19  6:53 PM   Specimen:  Nasopharyngeal Swab  Result Value Ref Range Status   SARS Coronavirus 2 NEGATIVE NEGATIVE Final    Comment: (NOTE) SARS-CoV-2 target nucleic acids are NOT DETECTED.  The SARS-CoV-2 RNA is  generally detectable in upper and lower respiratory specimens during the acute phase of infection. The lowest concentration of SARS-CoV-2 viral copies this assay can detect is 250 copies / mL. A negative result does not preclude SARS-CoV-2 infection and should not be used as the sole basis for treatment or other patient management decisions.  A negative result may occur with improper specimen collection / handling, submission of specimen other than nasopharyngeal swab, presence of viral mutation(s) within the areas targeted by this assay, and inadequate number of viral copies (<250 copies / mL). A negative result must be combined with clinical observations, patient history, and epidemiological information.  Fact Sheet for Patients:   BoilerBrush.com.cy  Fact Sheet for Healthcare Providers: https://pope.com/  This test is not yet approved or  cleared by the [place] FDA and has been authorized for detection and/or diagnosis of SARS-CoV-2 by FDA under an Emergency Use Authorization (EUA).  This EUA will remain in effect (meaning this test can be used) for the duration of the COVID-19 declaration under Section 564(b)(1) of the Act, 21 U.S.C. section 360bbb-3(b)(1), unless the authorization is terminated or revoked sooner.  Performed at [facility], [address], [place], [place] [address]   Culture, blood (routine x 2)     Status: Abnormal   Collection Time: 10/07/19  8:33 PM   Specimen: BLOOD LEFT FOREARM  Result Value Ref Range Status   Specimen Description   Final    BLOOD LEFT FOREARM Performed at [facility], [address], [place], [place] [address]    Special Requests   Final    BOTTLES DRAWN AEROBIC AND ANAEROBIC Blood Culture  adequate volume Performed at [facility], [address], [place], [place] [address]    Culture  Setup Time   Final    ANAEROBIC AND AEROBIC BOTTLES GRAM POSITIVE COCCI Gram Stain Report Called to,Read Back By and Verified With: STYPA,N@1505  BY MATTHEWS, B 8.20.21 [facility] CRITICAL VALUE NOTED.  VALUE IS CONSISTENT WITH PREVIOUSLY REPORTED AND CALLED VALUE.    Culture (A)  Final    STAPHYLOCOCCUS AUREUS SUSCEPTIBILITIES PERFORMED ON PREVIOUS CULTURE WITHIN THE LAST 5 DAYS. Performed at [facility], [address], [place], [place] [address]    Report Status 10/10/2019 FINAL  Final  Culture, blood (routine x 2)     Status: Abnormal   Collection Time: 10/07/19  8:33 PM   Specimen: BLOOD LEFT FOREARM  Result Value Ref Range Status   Specimen Description   Final    BLOOD LEFT FOREARM Performed at [facility], [address], [place], [place] [address]    Special Requests   Final    BOTTLES DRAWN AEROBIC AND ANAEROBIC Blood Culture adequate volume Performed at [facility], [address], [place], [place] [address]    Culture  Setup Time   Final    ANAEROBIC AND AEROBIC BOTTLES GRAM POSITIVE COCCI Gram Stain Report Called to,Read Back By and Verified With: STYPA,N @1505  BY MATTHEWS,B 8.20.21 [facility] Organism ID to follow CRITICAL RESULT CALLED TO, READ BACK BY AND VERIFIED WITH: B,Vernetta Dizdarevic RN @2029  10/08/19 EB Performed at [facility], [address], [place], [address] [place]    Culture STAPHYLOCOCCUS AUREUS (A)  Final   Report Status 10/10/2019 FINAL  Final   Organism ID, Bacteria STAPHYLOCOCCUS AUREUS  Final      Susceptibility   Staphylococcus aureus - MIC*    CIPROFLOXACIN <=0.5 SENSITIVE Sensitive     ERYTHROMYCIN >=8 RESISTANT Resistant  GENTAMICIN <=0.5 SENSITIVE Sensitive     OXACILLIN 0.5 SENSITIVE Sensitive     TETRACYCLINE <=1 SENSITIVE Sensitive     VANCOMYCIN <=0.5 SENSITIVE Sensitive     TRIMETH/SULFA <=10 SENSITIVE Sensitive      CLINDAMYCIN <=0.25 SENSITIVE Sensitive     RIFAMPIN <=0.5 SENSITIVE Sensitive     Inducible Clindamycin NEGATIVE Sensitive     * STAPHYLOCOCCUS AUREUS  Blood Culture ID Panel (Reflexed)     Status: Abnormal   Collection Time: 10/07/19  8:33 PM  Result Value Ref Range Status   Enterococcus faecalis NOT DETECTED NOT DETECTED Final   Enterococcus Faecium NOT DETECTED NOT DETECTED Final   Listeria monocytogenes NOT DETECTED NOT DETECTED Final   Staphylococcus species DETECTED (A) NOT DETECTED Final    Comment: RESULT CALLED TO, READ BACK BY AND VERIFIED WITH: B,Labrenda Lasky RN @2029  10/08/19 EB    Staphylococcus aureus (BCID) DETECTED (A) NOT DETECTED Final    Comment: RESULT CALLED TO, READ BACK BY AND VERIFIED WITH: B,Elsi Stelzer RN @2029  10/08/19 EB    Staphylococcus epidermidis NOT DETECTED NOT DETECTED Final   Staphylococcus lugdunensis NOT DETECTED NOT DETECTED Final   Streptococcus species NOT DETECTED NOT DETECTED Final   Streptococcus agalactiae NOT DETECTED NOT DETECTED Final   Streptococcus pneumoniae NOT DETECTED NOT DETECTED Final   Streptococcus pyogenes NOT DETECTED NOT DETECTED Final   A.calcoaceticus-baumannii NOT DETECTED NOT DETECTED Final   Bacteroides fragilis NOT DETECTED NOT DETECTED Final   Enterobacterales NOT DETECTED NOT DETECTED Final   Enterobacter cloacae complex NOT DETECTED NOT DETECTED Final   Escherichia coli NOT DETECTED NOT DETECTED Final   Klebsiella aerogenes NOT DETECTED NOT DETECTED Final   Klebsiella oxytoca NOT DETECTED NOT DETECTED Final   Klebsiella pneumoniae NOT DETECTED NOT DETECTED Final   Proteus species NOT DETECTED NOT DETECTED Final   Salmonella species NOT DETECTED NOT DETECTED Final   Serratia marcescens NOT DETECTED NOT DETECTED Final   Haemophilus influenzae NOT DETECTED NOT DETECTED Final   Neisseria meningitidis NOT DETECTED NOT DETECTED Final   Pseudomonas aeruginosa NOT DETECTED NOT DETECTED Final   Stenotrophomonas maltophilia  NOT DETECTED NOT DETECTED Final   Candida albicans NOT DETECTED NOT DETECTED Final   Candida auris NOT DETECTED NOT DETECTED Final   Candida glabrata NOT DETECTED NOT DETECTED Final   Candida krusei NOT DETECTED NOT DETECTED Final   Candida parapsilosis NOT DETECTED NOT DETECTED Final   Candida tropicalis NOT DETECTED NOT DETECTED Final   Cryptococcus neoformans/gattii NOT DETECTED NOT DETECTED Final   Meth resistant mecA/C and MREJ NOT DETECTED NOT DETECTED Final    Comment: RESULT CALLED TO, READ BACK BY AND VERIFIED WITH: B,Vinal Rosengrant RN @2029  10/08/19 EB Performed at [facility], [address] [place], [facility] [address]   Culture, blood (Routine X 2) w Reflex to ID Panel     Status: None (Preliminary result)   Collection Time: 10/08/19  9:17 PM   Specimen: BLOOD LEFT FOREARM  Result Value Ref Range Status   Specimen Description BLOOD LEFT FOREARM  Final   Special Requests   Final    BOTTLES DRAWN AEROBIC AND ANAEROBIC Blood Culture adequate volume   Culture   Final    NO GROWTH 2 DAYS Performed at [facility], [address], [place], [facility] [address]    Report Status PENDING  Incomplete  Culture, blood (Routine X 2) w Reflex to ID Panel     Status: None (Preliminary result)   Collection Time: 10/08/19  9:17 PM  Specimen: BLOOD LEFT FOREARM  Result Value Ref Range Status   Specimen Description BLOOD LEFT FOREARM  Final   Special Requests   Final    BOTTLES DRAWN AEROBIC AND ANAEROBIC Blood Culture adequate volume   Culture   Final    NO GROWTH 2 DAYS Performed at [facility], [address] [place], [place] [address]    Report Status PENDING  Incomplete   Time coordinating discharge:   SIGNED:  Standley Dakins, MD  [facility] 10/12/2019, 11:48 AM How to contact the [facility] Attending or Consulting provider 7A - 7P or covering provider during after hours 7P -7A, for this patient?  1. Check the care team in [facility] and look for a) attending/consulting [facility]  provider listed and b) the [facility] team listed 2. Log into [email or web address] and use [facility]'s universal password to access. If you do not have the password, please contact the hospital operator. 3. Locate the [facility] provider you are looking for under [facility] and page to a number that you can be directly reached. 4. If you still have difficulty reaching the provider, please page the [facility] (Director on Call) for the Hospitalists listed on amion for assistance.

## 2019-10-13 LAB — CULTURE, BLOOD (ROUTINE X 2)
Culture: NO GROWTH
Culture: NO GROWTH
Special Requests: ADEQUATE
Special Requests: ADEQUATE

## 2019-10-13 MED ORDER — DOXYCYCLINE HYCLATE 100 MG PO TABS: 100.0000 mg | ORAL_TABLET | Freq: Two times a day (BID) | ORAL | 0 refills | Status: DC

## 2019-10-13 NOTE — Telephone Encounter (Signed)
Sent doxycycline 100 mg BID x 28 days to [facility], informed patient's mother.

## 2019-10-13 NOTE — Addendum Note (Signed)
Addended by: Andree Coss on: 10/13/2019 02:47 PM   Modules accepted: Orders

## 2019-10-13 NOTE — Telephone Encounter (Signed)
It would be VERY helpful if he would not leave AMA all of the time. Another cheaper option is doxycycline 100mg  bid x 28 days

## 2019-10-31 IMAGING — MR MR SHOULDER*L* W/O CM
4 of 6 series · 18 of 40 positions shown · non-contrast
Comparison: Radiographs 12/24/2017

Addendum:
CLINICAL DATA: Left shoulder pain. Known diskitis and osteomyelitis
in the spine.

EXAM:
MRI OF THE LEFT SHOULDER WITHOUT CONTRAST
TECHNIQUE: Multiplanar, multisequence MR imaging of the shoulder was performed.
No intravenous contrast was administered.

[Series 3: pdfs axial · axial · 4.0mm · 0.26mm/px · z∈[-54,+17]mm · 5 of 20 slices shown]
[im 1/20]
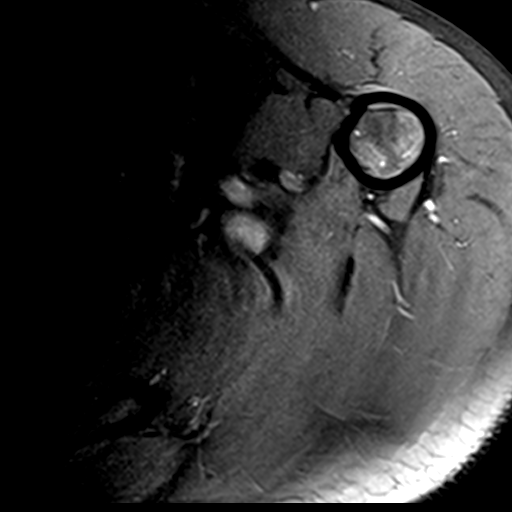
[im 4/20]
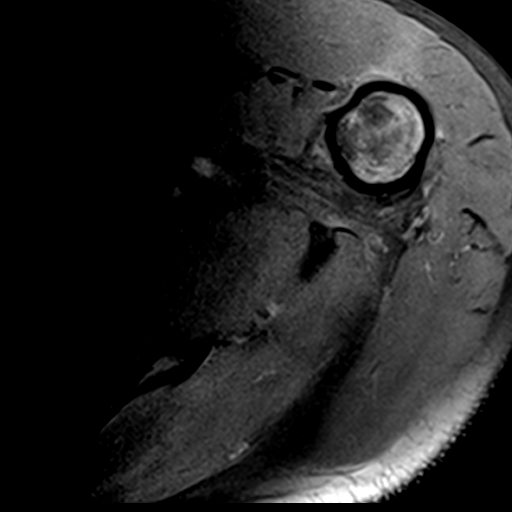
[im 7/20]
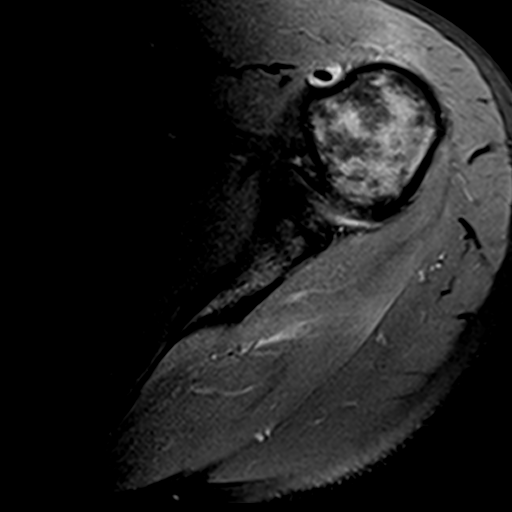
[im 10/20]
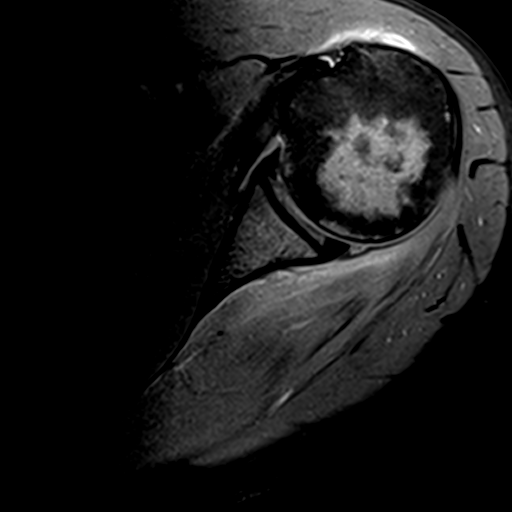
[im 16/20]
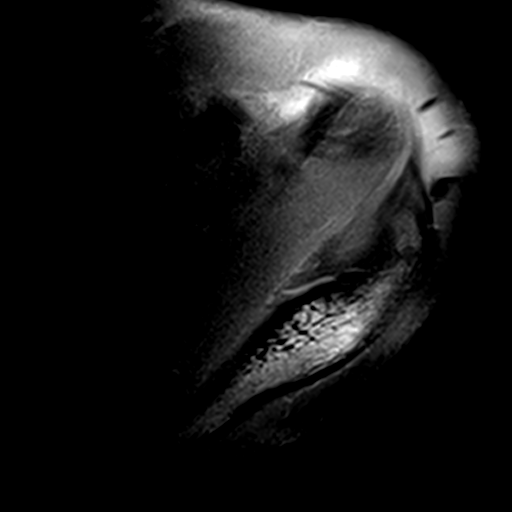

[Series 4: t2fs coronal · oblique · 4.0mm · 0.27mm/px · 3 of 20 slices shown]
[im 4/20]
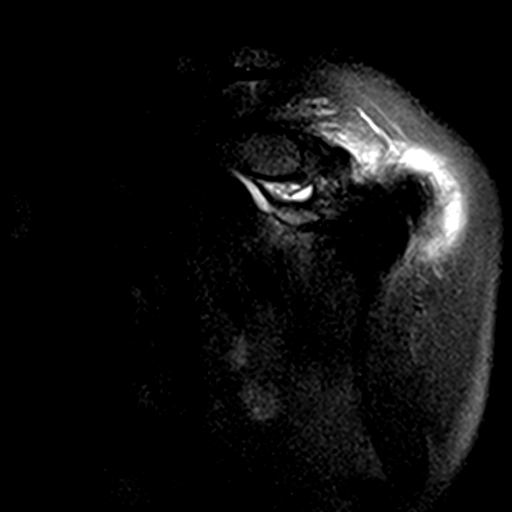
[im 12/20]
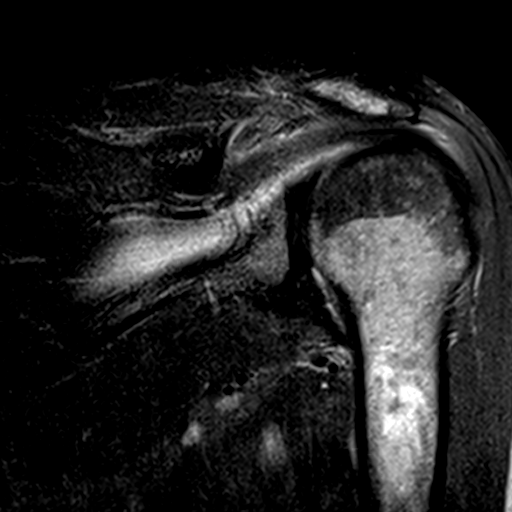
[im 20/20]
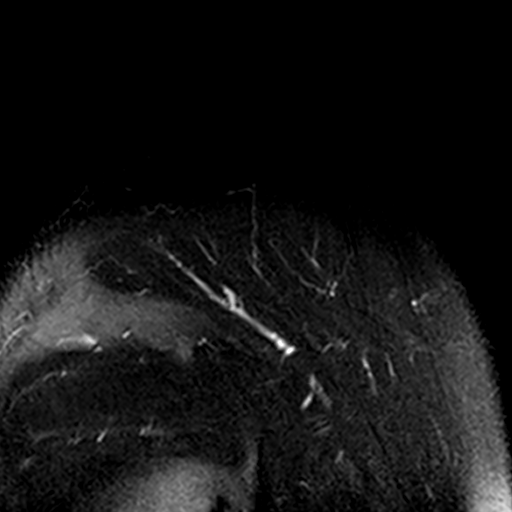

[Series 5: pdfs coronal · oblique · 4.0mm · 0.27mm/px · 3 of 20 slices shown]
[im 4/20]
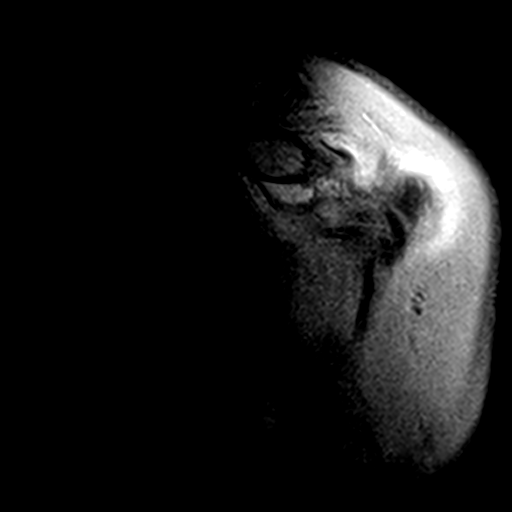
[im 12/20]
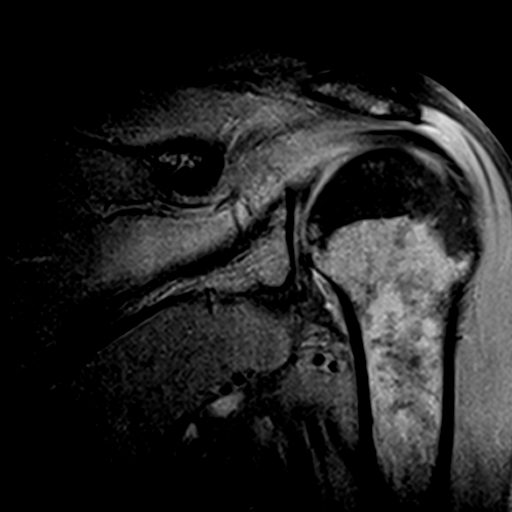
[im 20/20]
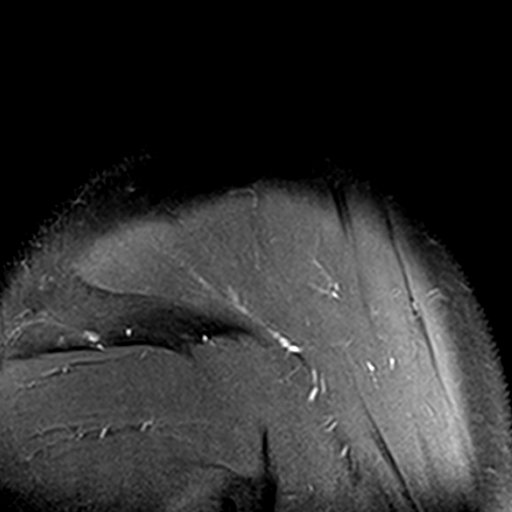

[Series 6: T1 · oblique · 4.0mm · 0.25mm/px · 7 of 24 slices shown]
[im 1/24]
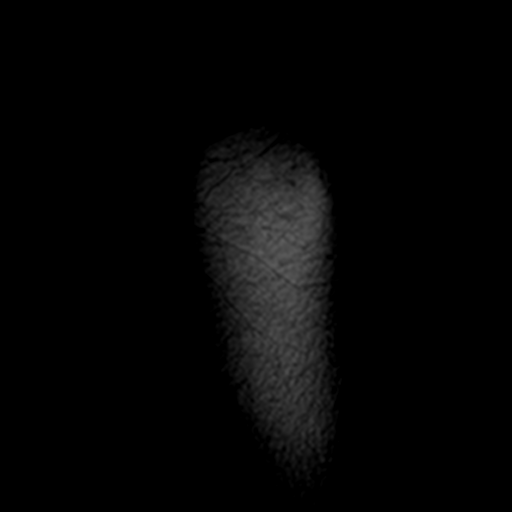
[im 4/24]
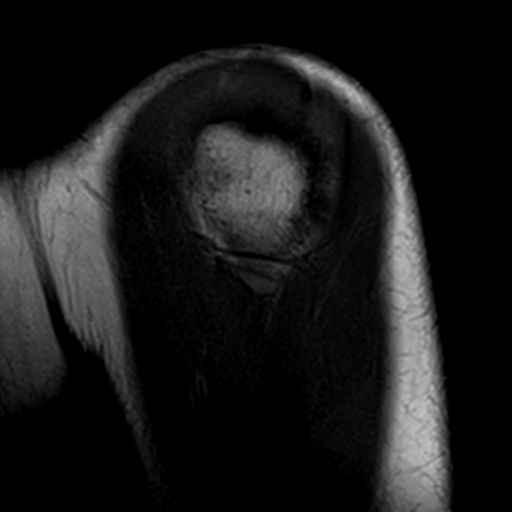
[im 8/24]
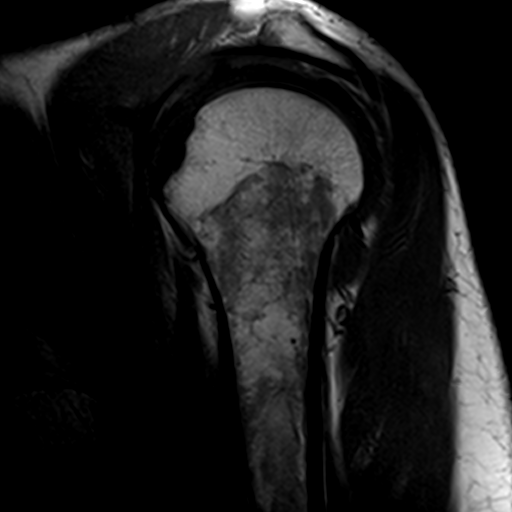
[im 12/24]
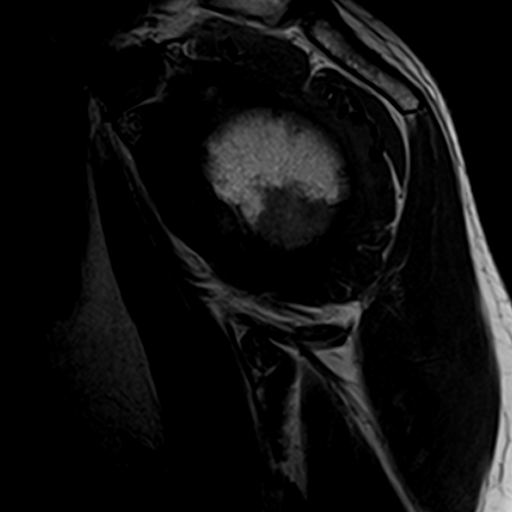
[im 16/24]
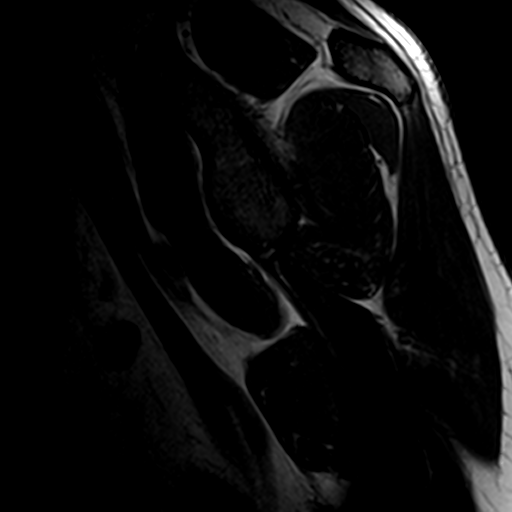
[im 20/24]
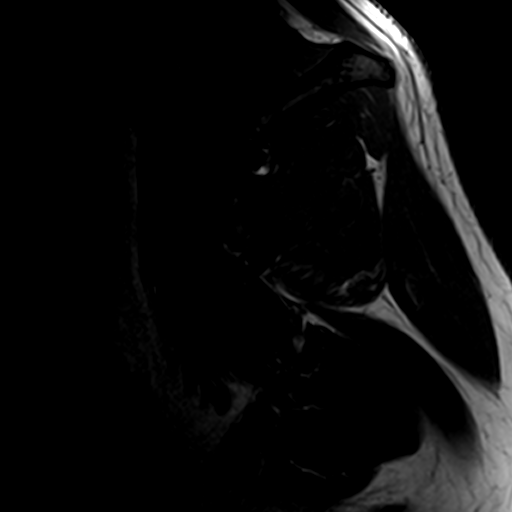
[im 24/24]
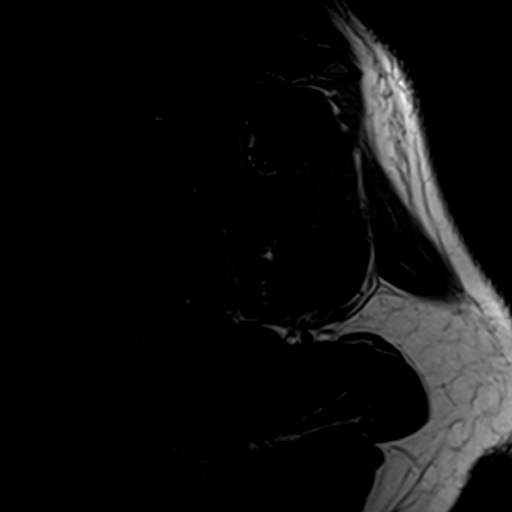

[18 of 40 positions shown; findings below may reference images not displayed]

FINDINGS: Rotator cuff: Mild tendinopathy/tendinosis. No partial or
full-thickness rotator cuff tear.

Muscles: There is moderate edema like signal abnormality in the
infraspinatus and teres minor muscles suggesting myositis. If there
is a history of trauma this could be a muscle tear or muscle strain.

Biceps long head:  Intact.

Acromioclavicular Joint: No significant degenerative changes type 1
acromion. No lateral downsloping or undersurface spurring.

Glenohumeral Joint: No joint effusion. Mild synovitis versus
adhesive capsulitis.

Labrum: Degenerated and possibly torn superior labrum. The anterior
and posterior labrum appear normal.

Bones: Very heterogeneous marrow signal in the metadiaphyseal region
of the humerus. This is probably related to smoking or anemia. It
does not have the MR appearance of osteomyelitis. There is no
cortical edema or periosteal reaction or pericortical fluid. Similar
appearance of the scapula and clavicle.

Other: Moderate fluid in the subacromial/subdeltoid bursa. This
could be simple bursitis but could not exclude septic bursitis given
the patient's situation.
IMPRESSION: 1. Edema like signal abnormality in the infraspinatus and teres
minor muscles could suggest a muscle strain/partial tear if there is
a history of trauma. Otherwise, this could be myositis. No definite
findings for pyomyositis.
2. Moderate subacromial/subdeltoid fluid could be simple bursitis or
septic bursitis given the patient's clinical situation.
3. Patchy marrow signal abnormality in the humerus but no definite
MR findings to suggest osteomyelitis.
4. Mild rotator cuff tendinopathy/tendinosis.  No tear.

ADDENDUM:
I failed to mention that there are numerous rounded pulmonary
lesions noted which are highly suspicious for septic emboli.
Recommend chest CT with contrast for further evaluation.

These results will be called to the ordering clinician or
representative by the Radiologist Assistant, and communication
documented in the PACS or zVision Dashboard.

*** End of Addendum ***

## 2019-11-08 ENCOUNTER — Ambulatory Visit (INDEPENDENT_AMBULATORY_CARE_PROVIDER_SITE_OTHER): Payer: Self-pay | Admitting: Infectious Disease

## 2019-11-08 ENCOUNTER — Other Ambulatory Visit: Payer: Self-pay

## 2019-11-08 ENCOUNTER — Encounter: Payer: Self-pay | Admitting: Infectious Disease

## 2019-11-08 ENCOUNTER — Other Ambulatory Visit: Payer: Self-pay | Admitting: Infectious Disease

## 2019-11-08 VITALS — BP 122/81 | HR 80 | Temp 98.0°F | Resp 16 | Ht 72.0 in | Wt 206.0 lb

## 2019-11-08 DIAGNOSIS — I330 Acute and subacute infective endocarditis: Secondary | ICD-10-CM

## 2019-11-08 DIAGNOSIS — M4646 Discitis, unspecified, lumbar region: Secondary | ICD-10-CM

## 2019-11-08 DIAGNOSIS — B958 Unspecified staphylococcus as the cause of diseases classified elsewhere: Secondary | ICD-10-CM

## 2019-11-08 DIAGNOSIS — R7881 Bacteremia: Secondary | ICD-10-CM

## 2019-11-08 DIAGNOSIS — B9561 Methicillin susceptible Staphylococcus aureus infection as the cause of diseases classified elsewhere: Secondary | ICD-10-CM

## 2019-11-08 DIAGNOSIS — M25532 Pain in left wrist: Secondary | ICD-10-CM

## 2019-11-08 DIAGNOSIS — M4626 Osteomyelitis of vertebra, lumbar region: Secondary | ICD-10-CM

## 2019-11-08 DIAGNOSIS — K047 Periapical abscess without sinus: Secondary | ICD-10-CM

## 2019-11-08 HISTORY — DX: Periapical abscess without sinus: K04.7

## 2019-11-08 MED ORDER — DOXYCYCLINE HYCLATE 100 MG PO TABS: 100.0000 mg | ORAL_TABLET | Freq: Two times a day (BID) | ORAL | 0 refills | Status: DC

## 2019-11-08 MED ORDER — CYCLOBENZAPRINE HCL 10 MG PO TABS: 10.0000 mg | ORAL_TABLET | Freq: Three times a day (TID) | ORAL | 0 refills | Status: AC | PRN

## 2019-11-08 MED ORDER — AMOXICILLIN-POT CLAVULANATE 875-125 MG PO TABS: 1.0000 | ORAL_TABLET | Freq: Two times a day (BID) | ORAL | 1 refills | Status: AC

## 2019-11-08 NOTE — Progress Notes (Signed)
Subjective:  Chief complaints: Pain in his wrist while he works in erythema on his left wrist low back pain and concerned that he is having return of abscesses, purulence from infected tooth and also concerned about passing MSSA to his girlfriend.    Patient ID: Howard Diaz, male    DOB: July 27, 1985, 34 y.o.   MRN: [id number]  HPI 34 y.o. male with  hx of IVDU, alcoholic cirrhosis, prior MSSA bacteremia and LS osteomyelitis diskitis in 2019 now admitted with severe back pain and found to have MSSAB again. MRI of L-S seems relatively stable. Apparently he has multiple infected skin lesions  When I saw him as an inpatient at [facility] long the plan was to try to get him through 4 weeks of IV antibiotics followed by pills.  He ended up leaving AGAINST MEDICAL ADVICE and a prescription for Zyvox was sent to a pharmacy in [place] apparently he was told that it would cost $3600 despite him having [place].  I called in doxycycline in place of this which he has been taking.  He feels his low back pain may be worsening and he is worried that his infection there is worsening he also has pain in his left wrist which I was unaware of when he was an inpatient he says when he works in particular he has pain there he does not have much tenderness on exam he does have some eczema cystlike changes on the lateral aspect of that hand.  He also showed me active pus draining from an infected tooth which he thinks is also possible source of his disseminated MSSA infection.  He was prescribed Suboxone by the hospitalist was not having more of this medication he asked if I could prescribe this I told him I could not.  He also asked for muscle relaxant which I told I would give him as a one-time prescription.  He was accompanied by his mother.  There is a lot of concerns about whether his [place] would cover various procedures here or medications.      Past Medical History:  Diagnosis Date    . Alcohol abuse   . Back pain   . Cirrhosis of liver (HCC)   . Dental infection 11/08/2019  . Hepatitis C   . IV drug abuse (HCC)   . Jaundice     History reviewed. No pertinent surgical history.  History reviewed. No pertinent family history.    Social History   Socioeconomic History  . Marital status: Single    Spouse name: Not on file  . Number of children: Not on file  . Years of education: Not on file  . Highest education level: Not on file  Occupational History  . Not on file  Tobacco Use  . Smoking status: Current Every Day Smoker    Packs/day: 1.00  . Smokeless tobacco: Never Used  Vaping Use  . Vaping Use: Former  . Substances: Nicotine  Substance and Sexual Activity  . Alcohol use: Not Currently    Alcohol/week: 14.0 standard drinks    Types: 14 Shots of liquor per week    Comment: 1-2 a day  . Drug use: Yes    Frequency: 3.0 times per week    Types: Marijuana, Methamphetamines    Comment: meth use a week ago  . Sexual activity: Not on file  Other Topics Concern  . Not on file  Social History Narrative  . Not on file   Social Determinants of Health  Financial Resource Strain:   . Difficulty of Paying Living Expenses: Not on file  Food Insecurity:   . Worried About Programme researcher, broadcasting/film/video in the Last Year: Not on file  . Ran Out of Food in the Last Year: Not on file  Transportation Needs:   . Lack of Transportation (Medical): Not on file  . Lack of Transportation (Non-Medical): Not on file  Physical Activity:   . Days of Exercise per Week: Not on file  . Minutes of Exercise per Session: Not on file  Stress:   . Feeling of Stress : Not on file  Social Connections:   . Frequency of Communication with Friends and Family: Not on file  . Frequency of Social Gatherings with Friends and Family: Not on file  . Attends Religious Services: Not on file  . Active Member of Clubs or Organizations: Not on file  . Attends Banker Meetings: Not on  file  . Marital Status: Not on file    No Active Allergies   Current Outpatient Medications:  .  buprenorphine-naloxone (SUBOXONE) 8-2 mg SUBL SL tablet, Place 1 tablet under the tongue 3 (three) times daily., Disp: , Rfl:  .  cyclobenzaprine (FLEXERIL) 10 MG tablet, Take 1 tablet (10 mg total) by mouth 3 (three) times daily as needed for muscle spasms., Disp: 30 tablet, Rfl: 0 .  amoxicillin-clavulanate (AUGMENTIN) 875-125 MG tablet, Take 1 tablet by mouth 2 (two) times daily., Disp: 60 tablet, Rfl: 1    Review of Systems  Constitutional: Positive for appetite change. Negative for activity change, chills, diaphoresis, fatigue, fever and unexpected weight change.  HENT: Positive for dental problem. Negative for congestion, rhinorrhea, sinus pressure, sneezing, sore throat and trouble swallowing.   Eyes: Negative for photophobia and visual disturbance.  Respiratory: Negative for cough, chest tightness, shortness of breath, wheezing and stridor.   Cardiovascular: Negative for chest pain, palpitations and leg swelling.  Gastrointestinal: Negative for abdominal distention, abdominal pain, anal bleeding, blood in stool, constipation, diarrhea, nausea and vomiting.  Genitourinary: Negative for difficulty urinating, dysuria, flank pain and hematuria.  Musculoskeletal: Positive for arthralgias and back pain. Negative for gait problem, joint swelling and myalgias.  Skin: Negative for color change, pallor, rash and wound.  Neurological: Negative for dizziness, tremors, weakness and light-headedness.  Hematological: Negative for adenopathy. Does not bruise/bleed easily.  Psychiatric/Behavioral: Positive for dysphoric mood. Negative for agitation, behavioral problems, confusion, decreased concentration and sleep disturbance.       Objective:   Physical Exam Constitutional:      Appearance: He is well-developed.  HENT:     Head: Normocephalic and atraumatic.  Eyes:     Conjunctiva/sclera:  Conjunctivae normal.  Cardiovascular:     Rate and Rhythm: Normal rate and regular rhythm.     Heart sounds: No murmur heard.  Friction rub present. No gallop.   Pulmonary:     Effort: Pulmonary effort is normal. No respiratory distress.     Breath sounds: Normal breath sounds. No stridor. No wheezing.  Abdominal:     General: There is no distension.     Palpations: Abdomen is soft.  Musculoskeletal:        General: No tenderness. Normal range of motion.     Cervical back: Normal range of motion and neck supple.  Skin:    General: Skin is warm and dry.     Coloration: Skin is not pale.     Findings: Erythema and rash present.  Neurological:  Mental Status: He is alert and oriented to person, place, and time.  Psychiatric:        Mood and Affect: Mood normal. Affect is flat.        Behavior: Behavior normal.        Thought Content: Thought content normal.        Cognition and Memory: Cognition and memory normal.        Judgment: Judgment normal.     Purulence from upper tooth  Tenderness in L spine region  Left hand:    Not esp tender though he has pain when he is working here.        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  Recent MRI here showed stability of his discitis findings and his inflammatory markers were normal.  He had 2D echocardiogram but not transesophageal echocardiogram.  Plan was to give him 4 weeks of IV antibiotics followed by pills be then left AGAINST MEDICAL ADVICE and has been on doxycycline  We will check inflammatory markers today CBC and comprehensive metabolic panel.  I ordered an x-ray of his wrist which has been a follow with an MRI but I do not know if any of these will be covered in [place] according to conversations I had with nursing staff.  I also did write prescriptions by hand for plain films of the wrist.  I also would like to get an MRI of the wrist  if plain films are unrevealing and an MRI of the lumbosacral spine.  Left wrist pain: As above on exam not much in the way of tenderness but he hurts when he is working and I worry about deep infection here.  I will switch from doxycycline to Augmentin.  Note penicillin was listed as an allergy but he is topical tolerate all amoxicillin without problems.  Dental infection with active pus draining: He needs to get engaged with an oral surgeon.  For the time being I am going to place him on Augmentin for the next month. Touch with a primary care physician to prescribe this  Hepatitis C seropositivity his hep C RNA was negative IV drug use he needs to be connected to a clinic for Suboxone use.  Back pain and spasm I have prescribed him some Flexeril but he needs to get into pcp  I spent greater than 40 minutes with the patient including greater than 50% of time in face to face counsel of the patient and his mother regarding nature of staphylococcal infections this patient's metastatic infection his dental infection is IV drug use and his damage to his lumbosacral spine and in coordination of his care.

## 2019-11-09 LAB — CBC WITH DIFFERENTIAL/PLATELET
Absolute Monocytes: 525 cells/uL (ref 200–950)
Basophils Absolute: 43 cells/uL (ref 0–200)
Basophils Relative: 0.6 %
Eosinophils Absolute: 249 cells/uL (ref 15–500)
Eosinophils Relative: 3.5 %
HCT: 38.7 % (ref 38.5–50.0)
Hemoglobin: 13.6 g/dL (ref 13.2–17.1)
Lymphs Abs: 3664 cells/uL (ref 850–3900)
MCH: 30.8 pg (ref 27.0–33.0)
MCHC: 35.1 g/dL (ref 32.0–36.0)
MCV: 87.8 fL (ref 80.0–100.0)
MPV: 10.3 fL (ref 7.5–12.5)
Monocytes Relative: 7.4 %
Neutro Abs: 2620 cells/uL (ref 1500–7800)
Neutrophils Relative %: 36.9 %
Platelets: 271 10*3/uL (ref 140–400)
RBC: 4.41 10*6/uL (ref 4.20–5.80)
RDW: 11.8 % (ref 11.0–15.0)
Total Lymphocyte: 51.6 %
WBC: 7.1 10*3/uL (ref 3.8–10.8)

## 2019-11-09 LAB — COMPLETE METABOLIC PANEL WITH GFR
AG Ratio: 1.3 (calc) (ref 1.0–2.5)
ALT: 14 U/L (ref 9–46)
AST: 18 U/L (ref 10–40)
Albumin: 4.2 g/dL (ref 3.6–5.1)
Alkaline phosphatase (APISO): 76 U/L (ref 36–130)
BUN: 13 mg/dL (ref 7–25)
CO2: 28 mmol/L (ref 20–32)
Calcium: 9.4 mg/dL (ref 8.6–10.3)
Chloride: 102 mmol/L (ref 98–110)
Creat: 0.88 mg/dL (ref 0.60–1.35)
GFR, Est African American: 129 mL/min/{1.73_m2} (ref >=60)
GFR, Est Non African American: 111 mL/min/{1.73_m2} (ref >=60)
Globulin: 3.3 g/dL (calc) (ref 1.9–3.7)
Glucose, Bld: 105 mg/dL — ABNORMAL HIGH (ref 65–99)
Potassium: 4.1 mmol/L (ref 3.5–5.3)
Sodium: 138 mmol/L (ref 135–146)
Total Bilirubin: 0.3 mg/dL (ref 0.2–1.2)
Total Protein: 7.5 g/dL (ref 6.1–8.1)

## 2019-11-09 LAB — C-REACTIVE PROTEIN: CRP: 2 mg/L (ref <8.0)

## 2019-11-09 LAB — SEDIMENTATION RATE: Sed Rate: 9 mm/h (ref 0–15)

## 2019-11-09 NOTE — Telephone Encounter (Signed)
Ok to fill 

## 2019-12-21 ENCOUNTER — Ambulatory Visit: Payer: Self-pay | Admitting: Infectious Disease
